# Patient Record
Sex: Female | Born: 1975 | Race: White | Hispanic: No | Marital: Married | Smoking: Former smoker
Health system: Southern US, Community
[De-identification: ages and names within clinical notes are randomized; demographics above are authoritative.]

## PROBLEM LIST (undated history)

## (undated) ENCOUNTER — Inpatient Hospital Stay (HOSPITAL_COMMUNITY): Payer: Self-pay

## (undated) DIAGNOSIS — E538 Deficiency of other specified B group vitamins: Secondary | ICD-10-CM

## (undated) DIAGNOSIS — Q899 Congenital malformation, unspecified: Secondary | ICD-10-CM

## (undated) DIAGNOSIS — K5900 Constipation, unspecified: Secondary | ICD-10-CM

## (undated) DIAGNOSIS — J45909 Unspecified asthma, uncomplicated: Secondary | ICD-10-CM

## (undated) DIAGNOSIS — N200 Calculus of kidney: Secondary | ICD-10-CM

## (undated) DIAGNOSIS — M1990 Unspecified osteoarthritis, unspecified site: Secondary | ICD-10-CM

## (undated) DIAGNOSIS — R011 Cardiac murmur, unspecified: Secondary | ICD-10-CM

## (undated) DIAGNOSIS — O039 Complete or unspecified spontaneous abortion without complication: Secondary | ICD-10-CM

## (undated) DIAGNOSIS — F909 Attention-deficit hyperactivity disorder, unspecified type: Secondary | ICD-10-CM

## (undated) DIAGNOSIS — Q999 Chromosomal abnormality, unspecified: Secondary | ICD-10-CM

## (undated) DIAGNOSIS — G589 Mononeuropathy, unspecified: Secondary | ICD-10-CM

## (undated) DIAGNOSIS — D689 Coagulation defect, unspecified: Secondary | ICD-10-CM

## (undated) DIAGNOSIS — A609 Anogenital herpesviral infection, unspecified: Secondary | ICD-10-CM

## (undated) DIAGNOSIS — E7211 Homocystinuria: Secondary | ICD-10-CM

## (undated) DIAGNOSIS — R519 Headache, unspecified: Secondary | ICD-10-CM

## (undated) DIAGNOSIS — S83519A Sprain of anterior cruciate ligament of unspecified knee, initial encounter: Secondary | ICD-10-CM

## (undated) DIAGNOSIS — D649 Anemia, unspecified: Secondary | ICD-10-CM

## (undated) DIAGNOSIS — E739 Lactose intolerance, unspecified: Secondary | ICD-10-CM

## (undated) DIAGNOSIS — O99345 Other mental disorders complicating the puerperium: Secondary | ICD-10-CM

## (undated) DIAGNOSIS — E7212 Methylenetetrahydrofolate reductase deficiency: Secondary | ICD-10-CM

## (undated) DIAGNOSIS — M2550 Pain in unspecified joint: Secondary | ICD-10-CM

## (undated) DIAGNOSIS — F53 Postpartum depression: Secondary | ICD-10-CM

## (undated) DIAGNOSIS — I8290 Acute embolism and thrombosis of unspecified vein: Secondary | ICD-10-CM

## (undated) DIAGNOSIS — I10 Essential (primary) hypertension: Secondary | ICD-10-CM

## (undated) DIAGNOSIS — M722 Plantar fascial fibromatosis: Secondary | ICD-10-CM

## (undated) DIAGNOSIS — R51 Headache: Secondary | ICD-10-CM

## (undated) HISTORY — DX: Headache, unspecified: R51.9

## (undated) HISTORY — DX: Postpartum depression: F53.0

## (undated) HISTORY — DX: Mononeuropathy, unspecified: G58.9

## (undated) HISTORY — DX: Attention-deficit hyperactivity disorder, unspecified type: F90.9

## (undated) HISTORY — DX: Other mental disorders complicating the puerperium: O99.345

## (undated) HISTORY — DX: Essential (primary) hypertension: I10

## (undated) HISTORY — DX: Unspecified asthma, uncomplicated: J45.909

## (undated) HISTORY — DX: Congenital malformation, unspecified: Q89.9

## (undated) HISTORY — DX: Chromosomal abnormality, unspecified: Q99.9

## (undated) HISTORY — DX: Coagulation defect, unspecified: D68.9

## (undated) HISTORY — DX: Deficiency of other specified B group vitamins: E53.8

## (undated) HISTORY — DX: Calculus of kidney: N20.0

## (undated) HISTORY — DX: Unspecified osteoarthritis, unspecified site: M19.90

## (undated) HISTORY — DX: Homocystinuria: E72.12

## (undated) HISTORY — DX: Constipation, unspecified: K59.00

## (undated) HISTORY — DX: Headache: R51

## (undated) HISTORY — PX: TONSILLECTOMY AND ADENOIDECTOMY: SUR1326

## (undated) HISTORY — DX: Pain in unspecified joint: M25.50

## (undated) HISTORY — PX: WISDOM TOOTH EXTRACTION: SHX21

## (undated) HISTORY — DX: Cardiac murmur, unspecified: R01.1

## (undated) HISTORY — DX: Anogenital herpesviral infection, unspecified: A60.9

## (undated) HISTORY — DX: Acute embolism and thrombosis of unspecified vein: I82.90

## (undated) HISTORY — DX: Methylenetetrahydrofolate reductase deficiency: E72.12

## (undated) HISTORY — DX: Lactose intolerance, unspecified: E73.9

## (undated) HISTORY — DX: Plantar fascial fibromatosis: M72.2

## (undated) HISTORY — DX: Homocystinuria: E72.11

## (undated) HISTORY — PX: OTHER SURGICAL HISTORY: SHX169

## (undated) HISTORY — DX: Anemia, unspecified: D64.9

## (undated) HISTORY — DX: Complete or unspecified spontaneous abortion without complication: O03.9

## (undated) HISTORY — PX: KNEE SURGERY: SHX244

---
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
## 2000-06-10 ENCOUNTER — Inpatient Hospital Stay (HOSPITAL_COMMUNITY): Admission: EM | Admit: 2000-06-10 | Discharge: 2000-06-12 | Payer: Self-pay | Admitting: Psychiatry

## 2004-04-23 ENCOUNTER — Encounter: Payer: Self-pay | Admitting: Family Medicine

## 2004-11-27 ENCOUNTER — Ambulatory Visit: Payer: Self-pay | Admitting: Family Medicine

## 2004-12-18 ENCOUNTER — Ambulatory Visit: Payer: Self-pay | Admitting: Family Medicine

## 2005-01-04 ENCOUNTER — Ambulatory Visit: Payer: Self-pay | Admitting: Family Medicine

## 2005-01-25 ENCOUNTER — Ambulatory Visit: Payer: Self-pay | Admitting: Family Medicine

## 2005-02-15 ENCOUNTER — Ambulatory Visit: Payer: Self-pay | Admitting: Family Medicine

## 2005-03-18 ENCOUNTER — Ambulatory Visit: Payer: Self-pay | Admitting: Family Medicine

## 2005-06-05 ENCOUNTER — Ambulatory Visit: Payer: Self-pay | Admitting: Family Medicine

## 2005-06-07 ENCOUNTER — Ambulatory Visit: Payer: Self-pay | Admitting: Family Medicine

## 2005-06-10 ENCOUNTER — Ambulatory Visit: Payer: Self-pay | Admitting: Family Medicine

## 2005-10-29 DIAGNOSIS — F909 Attention-deficit hyperactivity disorder, unspecified type: Secondary | ICD-10-CM | POA: Insufficient documentation

## 2005-10-29 DIAGNOSIS — F902 Attention-deficit hyperactivity disorder, combined type: Secondary | ICD-10-CM | POA: Insufficient documentation

## 2005-10-29 DIAGNOSIS — R760 Raised antibody titer: Secondary | ICD-10-CM | POA: Insufficient documentation

## 2005-12-25 ENCOUNTER — Encounter: Payer: Self-pay | Admitting: Family Medicine

## 2006-02-20 ENCOUNTER — Encounter: Payer: Self-pay | Admitting: Family Medicine

## 2006-02-20 ENCOUNTER — Ambulatory Visit: Payer: Self-pay | Admitting: Family Medicine

## 2006-02-26 ENCOUNTER — Telehealth: Payer: Self-pay | Admitting: Family Medicine

## 2006-02-27 ENCOUNTER — Telehealth: Payer: Self-pay | Admitting: Family Medicine

## 2006-04-16 ENCOUNTER — Ambulatory Visit: Payer: Self-pay | Admitting: Family Medicine

## 2006-06-26 ENCOUNTER — Encounter: Admission: RE | Admit: 2006-06-26 | Discharge: 2006-06-26 | Payer: Self-pay | Admitting: Family Medicine

## 2006-06-26 ENCOUNTER — Telehealth: Payer: Self-pay | Admitting: Family Medicine

## 2006-06-27 ENCOUNTER — Ambulatory Visit: Payer: Self-pay | Admitting: Family Medicine

## 2006-09-25 ENCOUNTER — Encounter: Payer: Self-pay | Admitting: Family Medicine

## 2006-09-25 ENCOUNTER — Ambulatory Visit: Payer: Self-pay | Admitting: Family Medicine

## 2006-09-29 ENCOUNTER — Telehealth: Payer: Self-pay | Admitting: Family Medicine

## 2006-09-29 ENCOUNTER — Encounter: Payer: Self-pay | Admitting: Family Medicine

## 2006-10-03 ENCOUNTER — Ambulatory Visit: Payer: Self-pay | Admitting: Family Medicine

## 2006-10-03 DIAGNOSIS — B009 Herpesviral infection, unspecified: Secondary | ICD-10-CM | POA: Insufficient documentation

## 2006-11-28 ENCOUNTER — Encounter: Payer: Self-pay | Admitting: Family Medicine

## 2006-11-28 ENCOUNTER — Other Ambulatory Visit: Admission: RE | Admit: 2006-11-28 | Discharge: 2006-11-28 | Payer: Self-pay | Admitting: Family Medicine

## 2006-11-28 ENCOUNTER — Ambulatory Visit: Payer: Self-pay | Admitting: Family Medicine

## 2006-12-02 LAB — CONVERTED CEMR LAB: Pap Smear: NORMAL

## 2006-12-09 ENCOUNTER — Ambulatory Visit: Payer: Self-pay | Admitting: Obstetrics & Gynecology

## 2006-12-16 ENCOUNTER — Encounter: Payer: Self-pay | Admitting: Obstetrics and Gynecology

## 2006-12-16 ENCOUNTER — Ambulatory Visit: Payer: Self-pay | Admitting: Obstetrics and Gynecology

## 2007-01-30 ENCOUNTER — Ambulatory Visit: Payer: Self-pay | Admitting: Physician Assistant

## 2007-01-30 ENCOUNTER — Ambulatory Visit (HOSPITAL_COMMUNITY): Admission: RE | Admit: 2007-01-30 | Discharge: 2007-01-30 | Payer: Self-pay | Admitting: Physician Assistant

## 2007-02-20 ENCOUNTER — Ambulatory Visit: Payer: Self-pay | Admitting: Physician Assistant

## 2007-02-20 ENCOUNTER — Ambulatory Visit (HOSPITAL_COMMUNITY): Admission: RE | Admit: 2007-02-20 | Discharge: 2007-02-20 | Payer: Self-pay | Admitting: Obstetrics & Gynecology

## 2007-02-27 ENCOUNTER — Ambulatory Visit (HOSPITAL_COMMUNITY): Admission: RE | Admit: 2007-02-27 | Discharge: 2007-02-27 | Payer: Self-pay | Admitting: Obstetrics & Gynecology

## 2007-03-20 ENCOUNTER — Ambulatory Visit: Payer: Self-pay | Admitting: Obstetrics and Gynecology

## 2007-04-09 ENCOUNTER — Ambulatory Visit: Payer: Self-pay | Admitting: Family Medicine

## 2007-04-17 ENCOUNTER — Ambulatory Visit: Payer: Self-pay | Admitting: Family

## 2007-05-08 ENCOUNTER — Ambulatory Visit: Payer: Self-pay | Admitting: Obstetrics and Gynecology

## 2007-05-22 ENCOUNTER — Ambulatory Visit: Payer: Self-pay | Admitting: Obstetrics and Gynecology

## 2007-06-05 ENCOUNTER — Ambulatory Visit: Payer: Self-pay | Admitting: Physician Assistant

## 2007-06-19 ENCOUNTER — Ambulatory Visit: Payer: Self-pay | Admitting: Obstetrics and Gynecology

## 2007-07-03 ENCOUNTER — Ambulatory Visit: Payer: Self-pay | Admitting: Physician Assistant

## 2007-07-10 ENCOUNTER — Ambulatory Visit: Payer: Self-pay | Admitting: Physician Assistant

## 2007-07-17 ENCOUNTER — Ambulatory Visit: Payer: Self-pay | Admitting: Obstetrics and Gynecology

## 2007-07-29 ENCOUNTER — Ambulatory Visit: Payer: Self-pay | Admitting: Obstetrics & Gynecology

## 2007-07-29 ENCOUNTER — Ambulatory Visit (HOSPITAL_COMMUNITY): Admission: RE | Admit: 2007-07-29 | Discharge: 2007-07-29 | Payer: Self-pay | Admitting: Obstetrics & Gynecology

## 2007-08-03 ENCOUNTER — Ambulatory Visit: Payer: Self-pay | Admitting: Obstetrics & Gynecology

## 2007-08-04 ENCOUNTER — Ambulatory Visit: Payer: Self-pay | Admitting: Family

## 2007-08-06 ENCOUNTER — Ambulatory Visit: Payer: Self-pay | Admitting: Advanced Practice Midwife

## 2007-08-06 ENCOUNTER — Inpatient Hospital Stay (HOSPITAL_COMMUNITY): Admission: RE | Admit: 2007-08-06 | Discharge: 2007-08-07 | Payer: Self-pay | Admitting: Family Medicine

## 2007-08-10 ENCOUNTER — Ambulatory Visit: Payer: Self-pay | Admitting: Family Medicine

## 2007-08-10 LAB — CONVERTED CEMR LAB
Bilirubin Urine: NEGATIVE
Hemoglobin: 11.2 g/dL
Ketones, urine, test strip: NEGATIVE
Specific Gravity, Urine: 1.015

## 2007-08-11 ENCOUNTER — Encounter: Payer: Self-pay | Admitting: Family Medicine

## 2007-08-14 ENCOUNTER — Ambulatory Visit: Payer: Self-pay | Admitting: Obstetrics and Gynecology

## 2007-09-18 ENCOUNTER — Ambulatory Visit: Payer: Self-pay | Admitting: Physician Assistant

## 2007-10-09 ENCOUNTER — Telehealth: Payer: Self-pay | Admitting: Family Medicine

## 2007-10-19 ENCOUNTER — Ambulatory Visit: Payer: Self-pay | Admitting: Family

## 2007-12-14 ENCOUNTER — Encounter: Payer: Self-pay | Admitting: Obstetrics and Gynecology

## 2007-12-14 ENCOUNTER — Ambulatory Visit: Payer: Self-pay | Admitting: Obstetrics and Gynecology

## 2007-12-15 ENCOUNTER — Encounter: Payer: Self-pay | Admitting: Obstetrics and Gynecology

## 2007-12-15 LAB — CONVERTED CEMR LAB: TSH: 1.015 u[IU]/mL

## 2008-02-01 ENCOUNTER — Ambulatory Visit: Payer: Self-pay | Admitting: Obstetrics and Gynecology

## 2008-02-02 ENCOUNTER — Encounter: Payer: Self-pay | Admitting: Obstetrics and Gynecology

## 2008-02-05 ENCOUNTER — Ambulatory Visit: Payer: Self-pay | Admitting: Family

## 2008-02-06 ENCOUNTER — Encounter: Payer: Self-pay | Admitting: Family

## 2008-02-06 LAB — CONVERTED CEMR LAB
Clue Cells Wet Prep HPF POC: NONE SEEN
WBC, Wet Prep HPF POC: NONE SEEN

## 2008-04-15 ENCOUNTER — Ambulatory Visit: Payer: Self-pay | Admitting: Obstetrics and Gynecology

## 2008-04-15 LAB — CONVERTED CEMR LAB
Basophils Absolute: 0 10*3/uL (ref 0.0–0.1)
Basophils Relative: 0 % (ref 0–1)
Hemoglobin: 12.1 g/dL (ref 12.0–15.0)
Hepatitis B Surface Ag: NEGATIVE
MCHC: 31.6 g/dL (ref 30.0–36.0)
MCV: 84.5 fL (ref 78.0–100.0)
Monocytes Absolute: 0.7 10*3/uL (ref 0.1–1.0)
Monocytes Relative: 8 % (ref 3–12)
Neutro Abs: 5.3 10*3/uL (ref 1.7–7.7)
Platelets: 379 10*3/uL (ref 150–400)
RDW: 13.9 % (ref 11.5–15.5)
WBC: 8.5 10*3/uL (ref 4.0–10.5)

## 2008-04-16 ENCOUNTER — Encounter: Payer: Self-pay | Admitting: Obstetrics and Gynecology

## 2008-05-08 ENCOUNTER — Inpatient Hospital Stay (HOSPITAL_COMMUNITY): Admission: AD | Admit: 2008-05-08 | Discharge: 2008-05-08 | Payer: Self-pay | Admitting: Obstetrics & Gynecology

## 2008-05-08 ENCOUNTER — Ambulatory Visit: Payer: Self-pay | Admitting: Advanced Practice Midwife

## 2008-05-12 ENCOUNTER — Ambulatory Visit: Payer: Self-pay | Admitting: Family Medicine

## 2008-05-12 ENCOUNTER — Encounter: Payer: Self-pay | Admitting: Family Medicine

## 2008-05-12 ENCOUNTER — Inpatient Hospital Stay (HOSPITAL_COMMUNITY): Admission: AD | Admit: 2008-05-12 | Discharge: 2008-05-12 | Payer: Self-pay | Admitting: Obstetrics and Gynecology

## 2008-05-27 ENCOUNTER — Ambulatory Visit: Payer: Self-pay | Admitting: Obstetrics and Gynecology

## 2008-07-26 ENCOUNTER — Encounter: Payer: Self-pay | Admitting: Obstetrics and Gynecology

## 2008-07-28 ENCOUNTER — Encounter: Payer: Self-pay | Admitting: Obstetrics and Gynecology

## 2008-07-28 LAB — CONVERTED CEMR LAB: Progesterone: 0.7 ng/mL

## 2008-09-25 ENCOUNTER — Inpatient Hospital Stay (HOSPITAL_COMMUNITY): Admission: AD | Admit: 2008-09-25 | Discharge: 2008-09-25 | Payer: Self-pay | Admitting: Obstetrics & Gynecology

## 2008-09-27 ENCOUNTER — Encounter: Payer: Self-pay | Admitting: Obstetrics and Gynecology

## 2008-09-27 LAB — CONVERTED CEMR LAB: hCG, Beta Chain, Quant, S: 484.3 milliintl units/mL

## 2008-10-07 ENCOUNTER — Ambulatory Visit: Payer: Self-pay | Admitting: Obstetrics & Gynecology

## 2008-10-07 ENCOUNTER — Ambulatory Visit: Payer: Self-pay | Admitting: Obstetrics and Gynecology

## 2008-10-14 ENCOUNTER — Ambulatory Visit: Payer: Self-pay | Admitting: Physician Assistant

## 2008-10-26 ENCOUNTER — Ambulatory Visit: Payer: Self-pay | Admitting: Family Medicine

## 2008-10-26 LAB — CONVERTED CEMR LAB: Rapid Strep: NEGATIVE

## 2008-10-27 ENCOUNTER — Telehealth: Payer: Self-pay | Admitting: Family Medicine

## 2008-11-04 ENCOUNTER — Ambulatory Visit: Payer: Self-pay | Admitting: Physician Assistant

## 2008-12-09 ENCOUNTER — Ambulatory Visit: Payer: Self-pay | Admitting: Obstetrics and Gynecology

## 2009-01-06 ENCOUNTER — Ambulatory Visit: Payer: Self-pay | Admitting: Obstetrics and Gynecology

## 2009-02-03 ENCOUNTER — Ambulatory Visit (HOSPITAL_COMMUNITY): Admission: RE | Admit: 2009-02-03 | Discharge: 2009-02-03 | Payer: Self-pay | Admitting: Obstetrics & Gynecology

## 2009-02-03 ENCOUNTER — Ambulatory Visit: Payer: Self-pay | Admitting: Obstetrics and Gynecology

## 2009-02-03 LAB — CONVERTED CEMR LAB
Basophils Relative: 0 % (ref 0–1)
HCT: 36.4 % (ref 36.0–46.0)
Hemoglobin: 11.6 g/dL — ABNORMAL LOW (ref 12.0–15.0)
Hepatitis B Surface Ag: NEGATIVE
Lymphocytes Relative: 21 % (ref 12–46)
MCHC: 31.9 g/dL (ref 30.0–36.0)
MCV: 86.9 fL (ref 78.0–100.0)
Monocytes Absolute: 0.6 10*3/uL (ref 0.1–1.0)
Monocytes Relative: 6 % (ref 3–12)
Platelets: 321 10*3/uL (ref 150–400)
RBC: 4.19 M/uL (ref 3.87–5.11)
Rubella: 123.1 intl units/mL — ABNORMAL HIGH
WBC: 10.4 10*3/uL (ref 4.0–10.5)

## 2009-02-04 ENCOUNTER — Encounter: Payer: Self-pay | Admitting: Obstetrics and Gynecology

## 2009-02-14 ENCOUNTER — Ambulatory Visit: Payer: Self-pay | Admitting: Obstetrics & Gynecology

## 2009-03-03 ENCOUNTER — Ambulatory Visit: Payer: Self-pay | Admitting: Physician Assistant

## 2009-03-03 LAB — CONVERTED CEMR LAB
Chlamydia, Swab/Urine, PCR: NEGATIVE
GC Probe Amp, Urine: NEGATIVE

## 2009-03-17 ENCOUNTER — Ambulatory Visit: Payer: Self-pay | Admitting: Physician Assistant

## 2009-03-17 LAB — CONVERTED CEMR LAB
Hemoglobin: 11.7 g/dL — ABNORMAL LOW (ref 12.0–15.0)
MCHC: 31.9 g/dL (ref 30.0–36.0)
RBC: 4.23 M/uL (ref 3.87–5.11)
RDW: 14.3 % (ref 11.5–15.5)

## 2009-03-18 ENCOUNTER — Encounter: Payer: Self-pay | Admitting: Obstetrics and Gynecology

## 2009-03-21 ENCOUNTER — Ambulatory Visit: Payer: Self-pay | Admitting: Obstetrics & Gynecology

## 2009-03-21 LAB — CONVERTED CEMR LAB
Alkaline Phosphatase: 65 units/L (ref 39–117)
BUN: 7 mg/dL (ref 6–23)
Calcium: 9.6 mg/dL (ref 8.4–10.5)
Creatinine, Ser: 0.6 mg/dL (ref 0.40–1.20)
HCT: 35.7 % — ABNORMAL LOW (ref 36.0–46.0)
Hemoglobin: 12.1 g/dL (ref 12.0–15.0)
MCHC: 33.9 g/dL (ref 30.0–36.0)
MCV: 84.8 fL (ref 78.0–100.0)
Potassium: 3.5 meq/L (ref 3.5–5.3)
RBC: 4.21 M/uL (ref 3.87–5.11)
Sodium: 135 meq/L (ref 135–145)
Total Bilirubin: 0.5 mg/dL (ref 0.3–1.2)

## 2009-03-31 ENCOUNTER — Ambulatory Visit: Payer: Self-pay | Admitting: Obstetrics and Gynecology

## 2009-03-31 ENCOUNTER — Encounter: Payer: Self-pay | Admitting: Physician Assistant

## 2009-04-14 ENCOUNTER — Ambulatory Visit: Payer: Self-pay | Admitting: Family Medicine

## 2009-04-28 ENCOUNTER — Ambulatory Visit: Payer: Self-pay | Admitting: Physician Assistant

## 2009-04-29 ENCOUNTER — Ambulatory Visit: Payer: Self-pay | Admitting: Advanced Practice Midwife

## 2009-04-29 ENCOUNTER — Inpatient Hospital Stay (HOSPITAL_COMMUNITY): Admission: AD | Admit: 2009-04-29 | Discharge: 2009-04-29 | Payer: Self-pay | Admitting: Obstetrics & Gynecology

## 2009-05-15 ENCOUNTER — Ambulatory Visit: Payer: Self-pay | Admitting: Family

## 2009-05-16 ENCOUNTER — Encounter: Payer: Self-pay | Admitting: Family

## 2009-05-16 LAB — CONVERTED CEMR LAB
Chlamydia, DNA Probe: NEGATIVE
GC Probe Amp, Genital: NEGATIVE

## 2009-05-26 ENCOUNTER — Ambulatory Visit: Payer: Self-pay | Admitting: Obstetrics and Gynecology

## 2009-06-04 ENCOUNTER — Ambulatory Visit: Payer: Self-pay | Admitting: Obstetrics and Gynecology

## 2009-06-04 ENCOUNTER — Inpatient Hospital Stay (HOSPITAL_COMMUNITY): Admission: AD | Admit: 2009-06-04 | Discharge: 2009-06-05 | Payer: Self-pay | Admitting: Obstetrics and Gynecology

## 2009-06-09 ENCOUNTER — Encounter: Payer: Self-pay | Admitting: Cardiology

## 2009-06-14 ENCOUNTER — Ambulatory Visit: Payer: Self-pay | Admitting: Cardiology

## 2009-06-14 ENCOUNTER — Encounter: Payer: Self-pay | Admitting: Cardiology

## 2009-06-14 ENCOUNTER — Ambulatory Visit: Payer: Self-pay | Admitting: Obstetrics & Gynecology

## 2009-06-16 ENCOUNTER — Ambulatory Visit (HOSPITAL_COMMUNITY): Admission: RE | Admit: 2009-06-16 | Discharge: 2009-06-16 | Payer: Self-pay | Admitting: Cardiology

## 2009-06-16 ENCOUNTER — Ambulatory Visit: Payer: Self-pay

## 2009-06-16 ENCOUNTER — Encounter: Payer: Self-pay | Admitting: Cardiology

## 2009-06-16 ENCOUNTER — Ambulatory Visit: Payer: Self-pay | Admitting: Cardiovascular Disease

## 2009-06-21 ENCOUNTER — Telehealth: Payer: Self-pay | Admitting: Cardiology

## 2009-07-21 ENCOUNTER — Ambulatory Visit: Payer: Self-pay | Admitting: Family

## 2009-08-02 ENCOUNTER — Encounter: Payer: Self-pay | Admitting: Cardiology

## 2009-08-02 ENCOUNTER — Ambulatory Visit: Payer: Self-pay | Admitting: Cardiology

## 2010-01-02 ENCOUNTER — Ambulatory Visit: Payer: Self-pay | Admitting: Emergency Medicine

## 2010-01-03 ENCOUNTER — Ambulatory Visit: Payer: Self-pay | Admitting: Obstetrics & Gynecology

## 2010-01-19 ENCOUNTER — Ambulatory Visit: Payer: Self-pay | Admitting: Obstetrics and Gynecology

## 2010-01-19 ENCOUNTER — Encounter: Payer: Self-pay | Admitting: Obstetrics and Gynecology

## 2010-01-19 ENCOUNTER — Ambulatory Visit: Payer: Self-pay | Admitting: Family Medicine

## 2010-01-19 LAB — CONVERTED CEMR LAB: TSH: 0.922 microintl units/mL (ref 0.350–4.500)

## 2010-02-02 ENCOUNTER — Ambulatory Visit (HOSPITAL_COMMUNITY)
Admission: RE | Admit: 2010-02-02 | Discharge: 2010-02-02 | Payer: Self-pay | Source: Home / Self Care | Attending: Obstetrics & Gynecology | Admitting: Obstetrics & Gynecology

## 2010-02-02 ENCOUNTER — Ambulatory Visit: Admit: 2010-02-02 | Payer: Self-pay | Admitting: Family

## 2010-02-12 ENCOUNTER — Encounter: Payer: Self-pay | Admitting: Obstetrics & Gynecology

## 2010-02-20 NOTE — Assessment & Plan Note (Signed)
Summary: [facility]   Visit Type:  6 weeks follow up Primary Provider:  Nani Gasser MD  CC:  No complains.  History of Present Illness: 35 year old female I recently saw in May of 2011 for evaluation of dyspnea and chest pain. Note she was recently postpartum at that time. She was seen in the [facility] emergency room 06/09/09 for chest pain. An electrocardiogram shows no ST changes. A chest x-ray showed mild cardiomegaly but no acute disease. A CAT scan showed no pulmonary embolus and was otherwise unremarkable. Renal function was normal. Liver function were normal. White blood cell count was 9.1 with a hemoglobin of 11.8 and hematocrit 34.5. Patient was discharged. We scheduled an echocardiogram which was performed in May of 2011. This showed normal LV function and no significant valvular abnormalities. Since then her dyspnea has completely resolved. She denies any dyspnea on exertion, orthopnea, PND, pedal edema, palpitations, syncope. She has had no further chest pain.  \ Current Medications (verified): 1)  Multivitamins  Tabs (Multiple Vitamin) .... Take 1 Tablet By Mouth Once A Day  Allergies (verified): No Known Drug Allergies  Past History:  Past Medical History: Reviewed history from 06/14/2009 and no changes required. 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 H/O heart problem as an infant  Past Surgical History: Reviewed history from 06/14/2009 and no changes required. D & C 1998 Surgery for arm fracture Arthroscopic knee surgery  Social History: Reviewed history from 06/14/2009 and no changes required. 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  Married.  Quit smoking -Smoked 1ppd for 10 yrs.  No EtOH, no drugs currently, 2 caffeinated drinks per day.  Walks 2-3x/wk.  Review of Systems       no fevers or chills, productive cough, hemoptysis, dysphasia, odynophagia, melena, hematochezia, dysuria, hematuria, rash, seizure activity,  orthopnea, PND, pedal edema, claudication. Remaining systems are negative.   Vital Signs:  Patient profile:   35 year old female Height:      66.5 inches Weight:      241.75 pounds BMI:     38.57 Pulse rate:   83 / minute Pulse rhythm:   regular Resp:     18 per minute BP sitting:   123 / 77  (right arm) Cuff size:   large  Vitals Entered By: Vikki Ports (August 02, 2009 12:30 PM)  Physical Exam  General:  Well-developed well-nourished in no acute distress.  Skin is warm and dry.  HEENT is normal.  Neck is supple. No thyromegaly.  Chest is clear to auscultation with normal expansion.  Cardiovascular exam is regular rate and rhythm.  Abdominal exam nontender or distended. No masses palpated. Extremities show no edema. neuro grossly intact    Impression & Recommendations:  Problem # 1:  CHEST PAIN (ICD-786.50) Symptoms have resolved. LV function normal with normal wall motion. No further workup at this time.  Problem # 2:  DYSPNEA (ICD-786.05) Patient's dyspnea has resolved. LV function normal. Previous CT showed no pulmonary embolus. No further workup.

## 2010-02-20 NOTE — Assessment & Plan Note (Signed)
Summary: [facility]   Visit Type:  Initial Consult Primary Provider:  Nani Gasser MD  CC:  chest pain and Sob.  History of Present Illness: 35 year old female status post delivery 10 days ago for evaluation of dyspnea and chest pain. She was seen in the [facility] emergency room 06/09/09 for chest pain. An electrocardiogram shows no ST changes. A chest x-ray showed mild cardiomegaly but no acute disease. A CAT scan showed no pulmonary embolus and was otherwise unremarkable. Renal function was normal. Liver function were normal. White blood cell count was 9.1 with a hemoglobin of 11.8 and hematocrit 34.5. Patient was discharged. She was seen in the OB/GYN office today and we were asked to further evaluate. The patient states that 3 days following delivery she developed a diffuse pain in her chest, back. It was described as an "ache.". It did not radiate. There was no associated nausea, vomiting, diaphoresis or shortness of breath. The pain was not pleuritic or positional nor was it related to food. It was not exertional. It has been continuous for one week. She has also noticed a sensation of dyspnea with exertion and "unable get a good breath". There is no orthopnea, PND or syncope. She does state she has had mild pedal edema intermittently.  Current Medications (verified): 1)  Prenatal Vitamins 0.8 Mg Tabs (Prenatal Multivit-Min-Fe-Fa) .... 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 H/O heart problem as an infant  Past Surgical History: D & C 1998 Surgery for arm fracture Arthroscopic knee surgery  Family History: Reviewed history from 06/14/2009 and no changes required. Daughter with autism and pancreatic  Adopted  Social History: Reviewed history from 06/14/2009 and no changes required. 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   Married.  Quit smoking -Smoked 1ppd for 10 yrs.  No EtOH, no drugs currently, 2 caffeinated drinks per day.  Walks 2-3x/wk.  Review of Systems       no fevers or chills, productive cough, hemoptysis, dysphasia, odynophagia, melena, hematochezia, dysuria, hematuria, rash, seizure activity, orthopnea, PND,  claudication. Remaining systems are negative.   Vital Signs:  Patient profile:   35 year old female Height:      66.5 inches Weight:      247 pounds BMI:     39.41 Pulse rate:   93 / minute Pulse rhythm:   regular Resp:     18 per minute BP sitting:   123 / 84  Vitals Entered By: Vikki Ports (Jun 14, 2009 1:03 PM)  Physical Exam  General:  Well developed/obese in NAD Skin warm/dry Patient not depressed No peripheral clubbing Back-normal HEENT-normal/normal eyelids Neck supple/normal carotid upstroke bilaterally; no bruits; no JVD; no thyromegaly chest - CTA/ normal expansion CV - RRR/normal S1 and S2; no  rubs or gallops; PMI nondisplaced; 2/6 systolic ejection murmur left sternal border.  Abdomen -NT/ND, no HSM, no mass, + bowel sounds, no bruit 2+ femoral pulses, no bruits Ext-no edema, chords, 2+ DP Neuro-grossly nonfocal     EKG  Procedure date:  06/14/2009  Findings:      Sinus rhythm at a rate of 93. Axis normal. No ST changes.  Impression & Recommendations:  Problem # 1:  DYSPNEA (ICD-786.05) Etiology unclear. Chest CT shows no pulmonary embolus by report. Electrocardiogram normal. She does not appear to be volume overloaded on examination. However I will schedule an echocardiogram to make sure  she does not have peripartum cardiomyopathy. Note her saturation on room air was 97% and continued to be 97% with ambulation in the office. Orders: Echocardiogram (Echo)  Problem # 2:  CHEST PAIN (ICD-786.50) Etiology also unclear. No pulmonary embolus on CT. Electrocardiogram normal and pain does not sound cardiac as it has been continuous for one week.  No change with movement. Question musculoskeletal. Await echocardiogram.  Problem # 3:  ATTENTION DEFICIT, W/HYPERACTIVITY (ICD-314.01)  Patient Instructions: 1)  Your physician recommends that you schedule a follow-up appointment in: 6 WEEKS 2)  Your physician has requested that you have an echocardiogram.  Echocardiography is a painless test that uses sound waves to create images of your heart. It provides your doctor with information about the size and shape of your heart and how well your heart's chambers and valves are working.  This procedure takes approximately one hour. There are no restrictions for this procedure.

## 2010-02-20 NOTE — Progress Notes (Signed)
Summary: returning call  Phone Note Call from Patient Call back at Home Phone [phone]   Caller: Patient Reason for Call: Talk to Nurse Summary of Call: returning call Initial call taken by: Migdalia Dk,  June 21, 2009 11:54 AM  Follow-up for Phone Call        Phone Call Completed Follow-up by: Scherrie Bateman, LPN,  June 22, 979 12:04 PM

## 2010-02-20 NOTE — Assessment & Plan Note (Signed)
Summary: DISCUSS BIRTH PLAN   Vital Signs:  Patient profile:   35 year old female Height:      66.5 inches Weight:      254 pounds BMI:     40.53 O2 Sat:      97 % on Room air Pulse rate:   119 / minute BP sitting:   139 / 78  (left arm)  Vitals Entered By: Payton Spark CMA (April 14, 2009 10:08 AM)  O2 Flow:  Room air CC: Discuss birth plan   Primary Care Provider:  Nani Gasser MD  CC:  Discuss birth plan.  History of Present Illness: 35 yo WF presents for discussion about birth plan.  She is seeing Hollace Kinnier, Nurse midwife.  Her EDC is 06-08-2009.  [id number].  She has had all vaginal deliveries.  She had one PTD (her 2nd child).  She is doing well.  Due to religious reasons, she requests: 1) a short hosp stay, barring a healthy mom and baby. 2) Newborn screening labs (or just PKU) to be drawn on day 3 of life -- can go back to the nursery for this. 3) no newborn bath -- this she will have to talk to [facility] about and is subject to change if meconium is present.  I explained that we would do our best to accomodate her wishes but the baby's health is going to be our priority.  I sent mom home with my business card.  Her doula will call us when she goes into labor.      Current Medications (verified): 1)  Acyclovir 400 Mg  Tabs (Acyclovir) .Marland Kitchen.. 1 Tab By Mouth Three Times A Day X 5 Days As Needed For Herpes Rash  Allergies (verified): No Known Drug Allergies   Impression & Recommendations:  Problem # 1:  PREGNANT STATE, INCIDENTAL (ICD-V22.2) Newborn plan of care discussed with mom. Time spent face to face: 15 min.  Complete Medication List: 1)  Acyclovir 400 Mg Tabs (Acyclovir) .Marland Kitchen.. 1 tab by mouth three times a day x 5 days as needed for herpes rash

## 2010-02-22 NOTE — Assessment & Plan Note (Signed)
Summary: Possible UTI/NH   Vital Signs:  Patient Profile:   35 Years Old Female CC:      possible UTI O2 treatment:    Room Air (left arm) Cuff size:   large  Vitals Entered By: Clemens Catholic LPN (January 02, 2010 3:32 PM)                  Updated Prior Medication List: MULTIVITAMINS  TABS (MULTIPLE VITAMIN) Take 1 tablet by mouth once a day  Current Allergies (reviewed today): No known allergies History of Present Illness Chief Complaint: possible UTI History of Present Illness: pt came to the clinic for pelvic pain after her OBGYN referred her to here. U/A was clear other than a trace of blood. reccomended that she increase fluids and see her OBGYN for her sch'ed appt tomorrow. if worsens today she should go to [facility] hospital- per Dr Henderson./C.Locklear,LPN  Agree with nurse's note.  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  No blood, N/V/F, HA, dizzy, SOB, vag d/c, or other symptoms other than lower abd pain.  No dysuria, freq, urg.  Pt with appt tomorrow at OB/GYN.  Pt triaged by nurse.  Urine is clean.  Tylenol as needed, hydration.  Then f/u as scheduled.  If severe symptoms or any worsening, [facility]  Physical exam deferred.  Patient's copay is refunded.    REVIEW OF SYSTEMS Constitutional Symptoms      Denies fever, chills, night sweats, weight loss, weight gain, and fatigue.  Eyes       Denies change in vision, eye pain, eye discharge, glasses, contact lenses, and eye surgery. Ear/Nose/Throat/Mouth       Denies hearing loss/aids, change in hearing, ear pain, ear discharge, dizziness, frequent runny nose, frequent nose bleeds, sinus problems, sore throat, hoarseness, and tooth pain or bleeding.  Respiratory       Denies dry cough, productive cough, wheezing, shortness of breath, asthma, bronchitis, and emphysema/COPD.  Cardiovascular       Denies murmurs, chest pain, and tires  easily with exhertion.    Gastrointestinal       Denies stomach pain, nausea/vomiting, diarrhea, constipation, blood in bowel movements, and indigestion. Genitourniary       Denies painful urination, kidney stones, and loss of urinary control. Neurological       Denies paralysis, seizures, and fainting/blackouts. Musculoskeletal       Denies muscle pain, joint pain, joint stiffness, decreased range of motion, redness, swelling, muscle weakness, and gout.  Skin       Denies bruising, unusual mles/lumps or sores, and hair/skin or nail changes.  Psych       Denies mood changes, temper/anger issues, anxiety/stress, speech problems, depression, and sleep problems. Other Comments: pt c/o    Past History:  Past Medical History: Reviewed history from 06/14/2009 and no changes required. 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 H/O heart problem as an infant  Past Surgical History: Reviewed history from 06/14/2009 and no changes required. D & C 1998 Surgery for arm fracture Arthroscopic knee surgery  Family History: Reviewed history from 06/14/2009 and no changes required. Daughter with autism and pancreatic  Adopted  Social History: Reviewed history from 06/14/2009 and no changes required. 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  Married.  Quit smoking -Smoked 1ppd for 10 yrs.  No EtOH, no drugs currently, 2 caffeinated drinks per day.  Walks 2-3x/wk.  The patient and/or caregiver has been  counseled thoroughly with regard to medications prescribed including dosage, schedule, interactions, rationale for use, and possible side effects and they verbalize understanding.  Diagnoses and expected course of recovery discussed and will return if not improved as expected or if the condition worsens. Patient and/or caregiver verbalized understanding.   Appended Document: Possible UTI/NH UA Glu: Neg Bil: Neg Ket: Neg SG: <= 1.005 Blood: Trace-intact pH: 5.5 Pro:  Neg Uro: 0.2 Nit: Neg Leu: Neg  Appended Document: Possible UTI/NH No v/s done. Physical exam was deferred and copay refunded. Patient to see OBGYN

## 2010-02-22 NOTE — Assessment & Plan Note (Signed)
Summary: CPE   Vital Signs:  Patient profile:   35 year old female Height:      66.5 inches Weight:      245 pounds Pulse rate:   66 / minute BP sitting:   132 / 76  (right arm) Cuff size:   large  Vitals Entered By: Avon Gully CMA, Duncan Dull) (January 19, 2010 10:26 AM) CC: CPE   Primary Care Provider:  Nani Gasser MD  CC:  CPE.  History of Present Illness: REcenlty gained weight with her move. Says ate out alot the first month until moved into their new home and during that time gained almost 20 lbs. She says she really struggles with portion control.  She overall eats more healthy.  She Is approx [redacted] weeks pregnant and find out this morning the baby has no heat beat. She is tearful. They will be getting genetic testing.   Current Medications (verified): 1)  Multivitamins  Tabs (Multiple Vitamin) .... Take 1 Tablet By Mouth Once A Day  Allergies (verified): No Known Drug Allergies  Comments:  Nurse/Medical Assistant: The patient's medications and allergies were reviewed with the patient and were updated in the Medication and Allergy Lists. 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 H/O heart problem as an infant  Past Surgical History: Last updated: 06/14/2009 D & C 1998 Surgery for arm fracture Arthroscopic knee surgery  Family History: Last updated: 06/14/2009 Daughter with autism and pancreatic  Adopted  Social History: Last updated: 01/19/2010 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  Married.4 children. Has had 6 miscarriages.   Quit smoking -Smoked 1ppd for 10 yrs.  No EtOH, no drugs currently, 2 caffeinated drinks per day.  Walks 2-3x/wk.  Social History: 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  Married.4 children. Has had 6 miscarriages.   Quit smoking -Smoked 1ppd for 10 yrs.  No EtOH, no drugs currently, 2  caffeinated drinks per day.  Walks 2-3x/wk.  Review of Systems  The patient denies anorexia, fever, weight loss, weight gain, vision loss, decreased hearing, hoarseness, chest pain, syncope, dyspnea on exertion, peripheral edema, prolonged cough, headaches, hemoptysis, abdominal pain, melena, hematochezia, severe indigestion/heartburn, hematuria, incontinence, genital sores, muscle weakness, suspicious skin lesions, transient blindness, difficulty walking, depression, unusual weight change, abnormal bleeding, enlarged lymph nodes, and breast masses.    Physical Exam  General:  Well-developed,well-nourished,in no acute distress; alert,appropriate and cooperative throughout examination Head:  Normocephalic and atraumatic without obvious abnormalities. No apparent alopecia or balding. Eyes:  No corneal or conjunctival inflammation noted. EOMI. Perrla.  Ears:  External ear exam shows no significant lesions or deformities.  Otoscopic examination reveals clear canals, tympanic membranes are intact bilaterally without bulging, retraction, inflammation or discharge. Hearing is grossly normal bilaterally. Nose:  External nasal examination shows no deformity or inflammation. Nasal mucosa are pink and moist without lesions or exudates. Mouth:  Oral mucosa and oropharynx without lesions or exudates.  Teeth in good repair. Neck:  No deformities, masses, or tenderness noted. Chest Wall:  No deformities, masses, or tenderness noted. Breasts:  No mass, nodules, thickening, tenderness, bulging, retraction, inflamation, nipple discharge or skin changes noted.   Lungs:  Normal respiratory effort, chest expands symmetrically. Lungs are clear to auscultation, no crackles or wheezes. Heart:  Normal rate and regular rhythm. S1 and S2 normal without gallop, murmur, click, rub or other extra sounds. Abdomen:  Bowel sounds positive,abdomen soft  and non-tender without masses, organomegaly or hernias noted. Msk:  No  deformity or scoliosis noted of thoracic or lumbar spine.   Pulses:  R and L carotid,radial,dorsalis pedis and posterior tibial pulses are full and equal bilaterally Extremities:  No clubbing, cyanosis, edema, or deformity noted with normal full range of motion of all joints.   Neurologic:  No cranial nerve deficits noted. Station and gait are normal.DTRs are symmetrical throughout. Sensory, motor and coordinative functions appear intact. Skin:  no rashes.   Cervical Nodes:  No lymphadenopathy noted Psych:  Cognition and judgment appear intact. Alert and cooperative with normal attention span and concentration. No apparent delusions, illusions, hallucinations   Impression & Recommendations:  Problem # 1:  HEALTH MAINTENANCE EXAM (ICD-V70.0) Exam is normal today. Due for screening labs.   She is not fasting today We discussed strategies to help with weight loos and with portion control.   Orders: T-Comprehensive Metabolic Panel [id number]) T-Lipid Profile [id number])  Complete Medication List: 1)  Multivitamins Tabs (Multiple vitamin) .... Take 1 tablet by mouth once a day   Orders Added: 1)  T-Comprehensive Metabolic Panel [[id number]] 2)  T-Lipid Profile [[id number]] 3)  Est. Patient age 59-39 [phone]

## 2010-03-08 ENCOUNTER — Encounter: Payer: Self-pay | Admitting: Family Medicine

## 2010-03-08 ENCOUNTER — Ambulatory Visit (INDEPENDENT_AMBULATORY_CARE_PROVIDER_SITE_OTHER): Payer: Managed Care, Other (non HMO) | Admitting: Family Medicine

## 2010-03-08 DIAGNOSIS — J069 Acute upper respiratory infection, unspecified: Secondary | ICD-10-CM | POA: Insufficient documentation

## 2010-03-14 NOTE — Assessment & Plan Note (Signed)
Summary: cough, headache//sp   Vital Signs:  Patient profile:   35 year old female Height:      66.5 inches Weight:      249.25 pounds BMI:     39.77 Temp:     98.2 degrees F oral  Vitals Entered By: Francee Piccolo CMA Duncan Dull) (March 08, 2010 1:57 PM) CC: severe headache//sp Is Patient Diabetic? No   History of Present Illness: 36 y/o WF here for URI and HA. She, her husband, and all 4 of her kids got nasal congestion, runny nose, sneezing, and cough about 10-14 days ago.  No fevers.  The URI symptoms and coughing ran their course early on, but daily headaches have persisted.  Describes entire head hurting from occiput to face.  No thick/purulent nasal d/c, no PND, no ST.  No abd pain, no wheezing or SOB.  No rash.  No n/v/d. Has soreness in muscles of back of her neck diffusely.  No neck stiffness.  Current Medications (verified): 1)  Multivitamins  Tabs (Multiple Vitamin) .... 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  Past Surgical History: Reviewed history from 06/14/2009 and no changes required. D & C 1998 Surgery for arm fracture Arthroscopic knee surgery  Family History: Reviewed history from 06/14/2009 and no changes required. Daughter with autism and pancreatic  Adopted  Social History: Reviewed history from 01/19/2010 and no changes required. Homemaker.  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   Married, 4 children.  Has had 6 miscarriages.   Quit smoking -Smoked 1ppd for 10 yrs.  No EtOH, no drugs currently, 2 caffeinated drinks per day.  Walks 2-3x/wk.  Review of Systems       see HPI  Physical  Exam  General:  VS: T 98.2  P 85  Wt 249.4  R 12 Gen: Alert, well appearing, oriented x 4. HEENT: Scalp without lesions or hair loss.  Ears: EACs clear, normal epithelium.  TMs with good light reflex and landmarks bilaterally.  Eyes: no injection, icteris, swelling, or exudate.  EOMI, PERRLA. Nose: no drainage or turbinate edema/swelling.  No injection or focal lesion.  Mouth: lips without lesion/swelling.  Oral mucosa pink and moist.  Dentition intact and without obvious caries or gingival swelling.  Oropharynx without erythema, exudate, or swelling.  Neck: mild diffuse TTP in posterior neck soft tissues.  No nuchal rigidity.  No LAD or TM. Chest: symmetric expansion, with nonlabored respirations.  Clear and equal breath sounds in all lung fields.   CV: RRR, no m/r/g.  Peripheral pulses 2+/symmetric. ABD: soft, nontender/nondistended. EXT: no c/c/e SKIN: no rash.    Impression & Recommendations:  Problem # 1:  VIRAL URI (ICD-465.9) Assessment New With persistent viral HA/neck myalgias. No sign of SBI.   Looks good today.  Reassured, discussed symptomatic care with OTC meds.  Watch for signs of worsening illness.  Complete Medication List: 1)  Multivitamins Tabs (Multiple vitamin) .... Take 1 tablet by mouth once a day   Orders Added: 1)  Est. Patient Level III [16109]

## 2010-04-09 LAB — CBC
Hemoglobin: 11.6 g/dL — ABNORMAL LOW (ref 12.0–15.0)
MCHC: 34.6 g/dL (ref 30.0–36.0)
RDW: 14.8 % (ref 11.5–15.5)

## 2010-04-11 LAB — URINALYSIS, ROUTINE W REFLEX MICROSCOPIC
Glucose, UA: NEGATIVE mg/dL
Ketones, ur: >80 mg/dL — AB
Protein, ur: NEGATIVE mg/dL
Urobilinogen, UA: 0.2 mg/dL (ref 0.0–1.0)

## 2010-04-27 LAB — POCT PREGNANCY, URINE: Preg Test, Ur: POSITIVE

## 2010-05-02 LAB — CBC
HCT: 33.9 % — ABNORMAL LOW (ref 36.0–46.0)
MCHC: 34.2 g/dL (ref 30.0–36.0)
MCV: 84 fL (ref 78.0–100.0)
Platelets: 354 10*3/uL (ref 150–400)
RBC: 4.03 MIL/uL (ref 3.87–5.11)
WBC: 11.6 10*3/uL — ABNORMAL HIGH (ref 4.0–10.5)

## 2010-05-02 LAB — ABO/RH: ABO/RH(D): A POS

## 2010-06-05 NOTE — Assessment & Plan Note (Signed)
[name], [name]               ACCOUNT NO.:  [id number]   MEDICAL RECORD NO.:  [id number]          PATIENT TYPE:  POB   LOCATION:  [facility] [place]         FACILITY:  [facility]   PHYSICIAN:  Sid Falcon, CNM  DATE OF BIRTH:  1975-10-21   DATE OF SERVICE:  07/21/2009                                  CLINIC NOTE   REASON FOR VISIT:  Postpartum visit.   The patient is here status post a normal spontaneous vaginal delivery on  Jun 04, 2009.  The patient did have her delivery en route to hospital in  vehicle without complications, arrived to the hospital where the  placenta was delivered by Deirdre Poe.  No lacerations and normal 24-  hour postpartum recovery.  The patient reports that bleeding continued  up until a week and half ago.  No clots or bleeding at this time.  Has  not resumed sexual intercourse.  Declines family planning at this time  and understands the risks of pregnancy.  The patient is also breast-  feeding well and did not have any emotional issues such as anxiety,  depression after delivery and believes that it is due to having such a  pleasant birth experience.  The patient has no additional concerns at  this time.   PHYSICAL EXAMINATION:  VITAL SIGNS:  Pulse 77, blood pressure 112/80,  weight 240.  LUNGS:  Clear to auscultation.  CARDIOVASCULAR:  Regular rate and rhythm without murmurs, gallops, or  rubs.  ABDOMEN:  Soft, nontender.   ASSESSMENT:  Postpartum exam.   PLAN:  Well-woman exam due in April of next year.  The patient will  notify us for any family planning needs or GYN issues.      Sid Falcon, CNM     WM/MEDQ  D:  07/21/2009  T:  07/21/2009  Job:  [id number]

## 2010-06-05 NOTE — Assessment & Plan Note (Signed)
[name], [name]               ACCOUNT NO.:  [id number]   MEDICAL RECORD NO.:  [id number]          PATIENT TYPE:  POB   LOCATION:  [facility] [place]         FACILITY:  [facility]   PHYSICIAN:  Caren Griffins, CNM       DATE OF BIRTH:  03/11/1975   DATE OF SERVICE:  05/27/2008                                  CLINIC NOTE   REASON FOR VISIT:  Followup SAB.   HISTORY:  This is a G6, P4-0-2-4 who had begun prenatal care here and is  well into the practices.  She had her last pregnancy and birth with [place]  as well.  She was initially seen at 6+ weeks in March, noted to have  early IUP 6+ weeks on April 18.  She was seen for vaginal spotting and  noted to have a 10-week size fetal demise on April 22.  She had heavy  bleeding and was seen at [facility] and found to have SAB in  progress.  Dr. Shawnie Pons sent products of conception to pathology which were  partially removed by instrumentation.  This path report shows chorionic  villi present without evidence of high data form change and this is  shared with the patient today.  She is having only scant brown spotting  at this point.  No cramping.  Breast-feeding well.  Her milk supply is  good.  Her 84-month-old is thriving.   OBJECTIVE:  VITAL SIGNS:  Weight 240, BP 107/74.   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  Currently lactational, coping  well with her loss.   PLAN:  She declines any contraception and in fact is looking forward to  another pregnancy.  We discussed that it would make sense to abstain for  intercourse until she has a spontaneous menses at least once and to work  on her own diet, exercise, weight loss regimen.  I also discussed  continuing Kegel's.  I talked at length about her and her family's  coping with this loss.            ______________________________  Caren Griffins, CNM     DP/MEDQ  D:  05/27/2008  T:  05/27/2008  Job:  [id number]

## 2010-06-05 NOTE — Assessment & Plan Note (Signed)
[name], FRISBY               ACCOUNT NO.:  [id number]   MEDICAL RECORD NO.:  [id number]          PATIENT TYPE:  POB   LOCATION:  [facility] [employer]         FACILITY:  [facility]   PHYSICIAN:  Caren Griffins, CNM       DATE OF BIRTH:  [birth year]   DATE OF SERVICE:                                  CLINIC NOTE   HISTORY:  This is a 35 year old G5, P4-0-1-4 who is now about 4 weeks  post NSVD.  She did present on September 18, 2007 for 6-week postpartum  visit and is here today to get her yearly Pap smear.  Of note, all her  Paps have been normal in the past and she has been getting them every  year.  We discussed the followup if this Pap is normal today that she  could go to every other year if she desires.  She is concerned primarily  today with weight gain.  Her weight is 244 which is about 20 pounds more  than her prepregnancy weight and of note, she weighed 228 at her 8-week  postpartum visit.  She denies specific symptoms of hypothyroidism other  than hair thinning.  She has no cold temperature intolerance.  She does  not feel that she has changed her eating and exercise habits and does  use a treadmill most days.  She was treated for postpartum depression  with Zoloft and she self discontinued that a couple of weeks ago but  couple of days ago has decided to start back and is now back on the  Zoloft.  She feels that it does control her symptoms.  She is still  breast-feeding without supplementing and is not interested in any method  other than what she has done in the past which is basal body temperature  and cervical mucus rhythm method.  She states she rarely has  intercourse.  She did have some spotting on December 11, 2007, and she  relates this to the baby sleeping at least 6 hours every night.   PHYSICAL EXAMINATION:  Not repeated in full but  VITAL SIGNS:  Weight is 244, height 5 feet 6 inches, pulse 91, BP  119/76.  GENERAL:  Normal affect and no apparent distress.  Thyroid  exam is  normal.  ABDOMEN:  Soft, obese, nontender and no organomegaly.  PELVIC:  NEFG, 1 to 2+ cystocele, fair tone, physiologic discharge.  Cervix clean.  Uterus poorly outlined, nontender.  No masses.  Adnexa  without tenderness or masses.   ASSESSMENT:  Normal GYN exam with significant recent weight gain.   PLAN:  Discussed her issues and advised her to increase her exercising  and do portion control more.  She plans to wait at least a year and half  before becoming pregnant again and is aware of the more reliable  contraceptive options which she declines.  TSH is sent today.           ______________________________  Caren Griffins, CNM     DP/MEDQ  D:  12/14/2007  T:  12/15/2007  Job:  [id number]

## 2010-06-05 NOTE — Assessment & Plan Note (Signed)
[name], [name]               ACCOUNT NO.:  [id number]   MEDICAL RECORD NO.:  [id number]          PATIENT TYPE:  POB   LOCATION:  [facility] [place]         FACILITY:  [facility]   PHYSICIAN:  Sid Falcon, CNM  DATE OF BIRTH:  [birth year]   DATE OF SERVICE:                                  CLINIC NOTE   The patient is here for recurring pelvic pain that occurs in the  evening.  Denies UTI symptoms.  No abnormal vaginal discharge.  Bladder  pressure towards the end of the evening.   On exam, pulse 76, blood pressure 108/75.  Cervix, no abnormal  discharge, no abnormal lesion, negative cervical motion tenderness,  prolapse noted.   ASSESSMENT:  Pelvic pain.   PLAN:  Discussed the patient with increased childbirths, that it is  common to have no lower pelvic pressure towards the end of the day just  due to ambulation and such.  I have recommended Kegel exercises and  using an abdominal binder.  Wet prep was obtained and sent to lab.  The  patient will follow up for increased pain or symptoms.      Sid Falcon, CNM     WM/MEDQ  D:  02/05/2008  T:  02/06/2008  Job:  [id number]

## 2010-06-05 NOTE — Assessment & Plan Note (Signed)
[name], [name]               ACCOUNT NO.:  [id number]   MEDICAL RECORD NO.:  [id number]          PATIENT TYPE:  POB   LOCATION:  [facility] [place]         FACILITY:  [facility]   PHYSICIAN:  Sid Falcon, CNM  DATE OF BIRTH:  Dec 06, 1975   DATE OF SERVICE:                                  CLINIC NOTE   The patient is seen today for followup from Zoloft initiation secondary  to postpartum depression.  The patient had a normal spontaneous vaginal  delivery on August 06, 2007, at [facility].  The patient reports  after taking Zoloft of 50 mg daily that she is feeling good, denies  feelings of sadness or hopelessness.  No suicidal ideation reports,  would like to continue the Zoloft and may consider weaning herself at a  later date that feels that she is doing fine.  The patient instructed  may continue Zoloft and is to follow up for well-woman exam in November  2009.      Sid Falcon, CNM     WM/MEDQ  D:  10/19/2007  T:  10/20/2007  Job:  [id number]

## 2010-06-05 NOTE — Assessment & Plan Note (Signed)
[name], [name]               ACCOUNT NO.:  [id number]   MEDICAL RECORD NO.:  [id number]          PATIENT TYPE:  POB   LOCATION:  [facility] [place]         FACILITY:  [facility]   PHYSICIAN:  Caren Griffins, CNM       DATE OF BIRTH:  [birth year]   DATE OF SERVICE:  08/14/2007                                  CLINIC NOTE   REASON FOR VISIT:  Breastfeeding concerns.   HISTORY OF PRESENT ILLNESS:  [name] is 1 week post NSVD which was  essentially uncomplicated.  She is concerned about the baby latching on  poorly.  However, he is thriving.  He sleeps for up to 2 to 3 hours, is  wetting plenty of diapers, and has now gained weight up to 10 ounces  above previous weight.  She has been to the family practice doctor twice  for the baby.  Her concern is that the baby sucks vigorously until she  has a letdown reflex and then kinds of tongues the nipple and spends  quite a long time nursing.  She is not engorged and has no nipple  excoriation or bleeding.  Most of what I suggested as far as different  positions, pushing the baby's chin down, pulling her areola to make the  nipple protrude are all things she has already tired and so I mostly  reassured her that the baby is thriving, and she does not have much to  worry about.  She is also wanting to discuss her labor and delivery  experience, which we did at length.  She is to come back p.r.n. and will  come here for a 6-week check.  We will again discuss family planning at  that time, but for religious reasons she probably will not use birth  control methods.           ______________________________  Caren Griffins, CNM     DP/MEDQ  D:  08/14/2007  T:  08/14/2007  Job:  [id number]

## 2010-06-05 NOTE — Assessment & Plan Note (Signed)
[name], LABELL               ACCOUNT NO.:  [id number]   MEDICAL RECORD NO.:  [id number]          PATIENT TYPE:  POB   LOCATION:  [facility] [place]         FACILITY:  [facility]   PHYSICIAN:  Maylon Cos, CNM    DATE OF BIRTH:  12-Apr-1975   DATE OF SERVICE:                                  CLINIC NOTE   The patient is being seen today, September 18, 2007, in the [place]  office.  She presents today for a 6-week postpartum visit.  [name] is a  35 year old Caucasian female, who had a normal spontaneous vaginal  delivery of a female infant on August 06, 2007 at the [facility].  She  was induced secondary for LGA.  She received 1 dose of Cervidil and  labored spontaneously.  Her baby weighed 8 pounds and 7 ounces and was  20.75 inches with Apgars of 9 and 9.  She is doing well today.  She does  have some complaints of breast pain.  She is breast-feeding completely.  She is not supplementing with formula.  She is pumping on occasion;  however, she reports that he does not take a bottle or pacifier, and  oftentimes he uses her as a pacifier, and she is reporting sore nipples.  She does report that he appears to have thrush and is seeing the  pediatrician today.  She also states that she is getting very minimal  sleep at night.  She has 2 other small children in the home that are  home-schooled and reports getting approximately 4 hours of sleep in the  last 24 hours.  She is teary during our conversation today, mainly in  recounting her birth.  She had lots of regrets and sadness due to the  delivery.  She had planned a natural birth and was disappointed by her  need for an induction of labor and her decision to get an epidural.  She  was disappointed and felt defeated with her need for induction; however,  she does verbalized that she understands the need based on the  recommendations that she was given by the midwives in our practice and  Dr. Penne Lash.  She did receive a second  opinion from Dr. Shawnie Pons secondary  to our recommendations.  We did not recommend a second opinion; however,  she sought a second opinion.  Dr. Shawnie Pons agreed with every information we  had given to her, and she went along with our plan of care.  She has  much sadness though still related to getting an epidural, and this was  the process under which her baby was born.  However, she had a normal  vaginal delivery and has a very healthy child, but she is sleep  deprived, she is teary, she does seem to be in need of more adult  contact.  She is married and has a very supportive husband who works  outside the home and is only there to assist with the children in the  evenings.  She does relate she has symptoms of depressions, but does not  feel that she is in danger of harming herself or her children.  She  would like to be placed on  medications for her symptoms.  We discussed  at length other coping mechanisms for depression and also ways to  increase her rest period during the day and the importance of adequate  sleep nutrition and hydration during postpartum period and also while  breast-feeding.  She does appear to have adequate milk supply.  Baby is  present during today's visit, and he is sleeping and appears to be a  well-nourished infant.  She reports that she has not resumed  intercourse, and she still has a scant amount of vaginal bleeding that  requires a Panty Liner.  She declines hormonal birth control secondary  to religious beliefs, and they plan to use condoms once they resume  intercourse.   PHYSICAL EXAMINATION:  On examination today, her vital signs are stable.  Her blood pressure is 107/67.  Her weight today is 235, which is 7  pounds less than her 1-week postpartum visit and 30 pounds lighter than  her last prenatal visit.  She continues to work on her weight  management.  Her exam today is problem focused on her breasts.  She does  appear to have the beginning of some  intraductal yeast.  Her nipples are  erect without discharge, but they are reddened and tender to palpation.  There is no streaking.  They are not warm to touch or hard.  There are  no signs and symptoms of mastitis as small amount of milk was easily  expressed with gentle palpation.  On abdominal exam, her rectus muscles  are closing well.  Pelvic exam was deferred secondary to no complaints.   ASSESSMENT AND PLAN:  1. Six-week postpartum visit, doing well.  2. Postpartum depression.  Prescription for Zoloft 25 mg x 7 days,      then increase to 50 mg p.o. daily.  We will follow up in 4 weeks in      the office for med check and discuss counseling options, which she      declines at this time.  3. Breast-feeding without difficulty.  Discussed the option of      [place] secondary to concerns of decreased milk supply on Zoloft;      however, we discussed at length the minimal risk of decreased milk      supply on this medication.  4. Intraductal yeast.  Prescription given for Diflucan 300 mg p.o.      daily x7 days.  5. Health maintenance.  The patient is encouraged to increase      activity.  She is released to do abdominal exercises.  Core      strengthening handout given.  Encouraged to increase p.o. hydration      with non-caffeinated beverages.  Encouraged to increase sleeping      and as much as possible continue multivitamin.  Physical exam with      Pap smear is due after December 16, 2007.           ______________________________  Maylon Cos, CNM     SS/MEDQ  D:  09/18/2007  T:  09/19/2007  Job:  [phone]

## 2010-06-05 NOTE — Assessment & Plan Note (Signed)
[name], WADLEY               ACCOUNT NO.:  [id number]   MEDICAL RECORD NO.:  [id number]          PATIENT TYPE:  POB   LOCATION:  [facility] [place]         FACILITY:  [facility]   PHYSICIAN:  Caren Griffins, CNM       DATE OF BIRTH:  February 28, 1975   DATE OF SERVICE:  02/01/2008                                  CLINIC NOTE   HISTORY:  Makinzy is now 6 months postpartum from her fourth vaginal  delivery and comes in today due to concerned that she has had pelvic  pressure and urinary urgency for about 3 days.  She feels it is  beginning of a UTI.  Also of note, she has had yeast infection of her  nipples.  Nipples are slightly redden.  No surrounding erythema, some  evidence of changing __________apply to nipples, and the baby has had  thrush in the mouth and skin.  She is unaware of yeast-type vaginal  discharge.  She is breast feeding without supplementation still and has,  however, had scant episode of spotting to light bleeding one time a  couple of weeks ago.  She has also been feeling some lower back  discomfort, which is worse when she is moving around.  She denies any  problems with postpartum depression, which she has had in the past.  She  states that all the children have had a viral illness, one daughter has  had a fever, and she herself has upper respiratory congestion and  malaise, but has not had a fever or cough.  Her children are cared for  by Dr. Linford Arnold and will be seen there today.   OBJECTIVE:  Urinalysis shows essentially negative dip stick except for  trace of blood, pH is 6.5, nitrites, and leukocyte esterase is negative.  This was sent for culture.  Her temperature is 98, pulse 102, BP120/70.  Abdominal exam is benign.  Pelvic is deferred.   ASSESSMENT:  Possible early urinary tract infection, viral upper  respiratory illness, yeast infection of nipples.   PLAN:  She is given a prescription for Diflucan presumptively and  hopefully this will eradicate any  possible yeast infection as well as of  her vagina as well as the nipples.  She is also advised that she can  speak with a lactation consultants particularly if this gets worse.  We  will await the result of urine culture and let her know about that, and  otherwise, she is to let us know if her symptoms are not self limited in  the next few days.  At that point, we will bring her back for a pelvic  exam.           ______________________________  Caren Griffins, CNM     DP/MEDQ  D:  02/01/2008  T:  02/02/2008  Job:  [phone]

## 2010-06-08 NOTE — H&P (Signed)
[facility]  Patient:    [name], [name]                     MRN: [id number] Adm. Date:  [id number] Attending:  Geoffery Lyons A Dictator:   Candi Leash. Theressa Stamps, N.P.                   Psychiatric Admission Assessment  PATIENT IDENTIFICATION:  This is a 35 year old single white female voluntarily admitted for heroin dependence wanting to be detoxified.  HISTORY OF PRESENT ILLNESS:  The patient presents with a history of heroin addiction for the past two to three years, using a "bundle to a bundle and a half" of heroin each day.  She states that is approximately $100 worth.  The patient has been using the heroin to help her out in school to "calm her mind."  She states she is unable to do well in her studies because she has a diagnosis of ADHD and she had problems with having poor grades and having difficulty focusing.  The patient was planning to get detoxified after school was over.  The patient has been obtaining the money from her parents who have no idea what she does and she does not want her parents to know about her substance abuse.  The patient reports that she has been sleeping well. Appetite has been fair.  She reports no weight change.  Denies any hallucinations or paranoia.  The patient also admits to using cocaine, last use 24 hours prior to admission, and the patient is currently experiencing withdrawal symptoms.  PAST PSYCHIATRIC HISTORY:  History of detoxification about seven years ago in [place].  History of ADHD by history; she has not been on any medications.  She does not see a therapist or a psychiatrist.  SUBSTANCE ABUSE HISTORY:  Smokes one pack a day for 10+ years, rare alcohol use.  Drug of choice is heroin, as stated uses about $100 worth per day, uses intravenously, began using it in 1999.  The patient also has been using cocaine on an occasional basis; last use was within 24 hours.  The patient smokes the cocaine and  sometimes injects it.  PAST MEDICAL HISTORY:  Primary care Gianni Fuchs: None.  Medical problems: Pericarditis and pleurisy diagnosed about a month ago.  She also has a history of viral pneumonia in February that is resolved.  Medications: Valtrex 75 mg b.i.d. prescribed by Dr. Selina Cooley in [place].  Drug allergies: PENICILLIN; the patient states she gets short of breath.  Physical examination: The patient went to [facility] yesterday seeking help.  We are awaiting physical examination and any labs that were obtained there.  SOCIAL HISTORY:  She is a 35 year old single white female.  She has one child that is 33 years of age.  The patient placed the child up for adoption.  She lives with her boyfriend who is also addicted to heroin and is currently in treatment for detoxification.  She is attending school at [employer].  She has one year to complete.  She is getting her BS in biology. She was accepted into medical school.  The patient has no court dates or legal problems.  Family history: The patient is adopted.  MENTAL STATUS EXAMINATION:  Alert, young Caucasian adult, casually dressed. Somewhat cooperative.  The patient needed to go out and smoke before she came to the interview and stated she had not slept.  Fair eye contact.  Speech is low  tone, relevant, no initiation of any conversation.  Mood is irritable. Affect is irritable.  Thought process are coherent, no evidence of psychosis, no suicidal or homicidal ideation, denies any auditory or visual hallucinations, no paranoia.  Cognitive functioning is intact.  Memory is good.  Judgment is impaired.  Insight is fair.  Poor impulse control.  Appears to be of average to above average intelligence.  ADMISSION DIAGNOSES: Axis I:    1. Polysubstance abuse.            2. Attention-deficit/hyperactivity disorder by history. Axis II:   Deferred. Axis III:  1. Pericarditis.            2. Shingles; the patient had  an episode of shingles a few months               back and has been on Valtrex for that. Axis IV:   Moderate psychosocial stressors relating to heroin dependence. Axis V:    Current is 50, past year 68.  INITIAL PLAN OF CARE:  Voluntary admission to [facility] for heroin dependence and requesting detoxification.  Contract for safety, check every 15 minutes; the patient will promise safety.  Will initiate a Wytensin protocol.  Will obtain labs.  Will have trazodone available for sleep.  Plan is for the patient to attend the [facility] after discharge, to return to her prior living arrangement, and for the patient to remain clean.  ESTIMATED LENGTH OF STAY:  Three to five days. DD:  06/10/00 TD:  06/10/00 Job: [id number]

## 2010-06-08 NOTE — Discharge Summary (Signed)
[facility]  Patient:    [name], [name]                     MRN: [id number] Adm. Date:  [id number] Disc. Date: [id number] Attending:  Geoffery Lyons A Dictator:   Candi Leash. Orsini, N.P.                           Discharge Summary  PATIENT IDENTIFICATION:  This is a 35 year old single white female, voluntarily admitted for heroin dependence, wanting to be detoxed.  The patient has had a history of heroin addiction for the past 2-3 years, using approximately $100 worth each day.  The patient has had difficulty at school. She has been using heroin to help her "calm her mind."  The patient was planning to be detoxified after school was over.  The patient has been sleeping well.  Appetite has been fair with no weight change.  No hallucinations or paranoia.  PAST MEDICAL HISTORY:  The primary care Linkon Siverson is not known.  The patient has a history of pericarditis and pleurisy, diagnosed about a month ago with a history of viable pneumonia.  CURRENT MEDICATIONS:   Valtrex 75 mg b.i.d.  DRUG ALLERGIES:  PENICILLIN.  PHYSICAL EXAMINATION:  Performed at [facility] Emergency Department.  MENTAL STATUS EXAMINATION:  She is an alert, young Caucasian female, casually dressed, somewhat cooperative.  The patient needed to go out and smoke before she came to the interview.  Fair eye contact.  Speech is low tone and relevant.  No initiation of any conversation.  Mood is irritable.  Affect is irritable.  Thought processes are coherent with no evidence of psychosis.  No suicidal or homicidal ideation or auditory or visual hallucinations.  No paranoia.  Cognitive functioning is intact.  Memory is good.  Judgment is impaired.  Insight is fair.  Poor impulse control.  Appears to be of average to above average intelligence.  ADMITTING DIAGNOSES: Axis I:    1. Polysubstance abuse.            2. Attention deficit hyperactivity disorder by history. Axis II:    Deferred. Axis III:  1. Pericarditis.            2. Shingles. Axis IV:   Moderate psychosocial stressors related to heroin dependence. Axis V:    Current is 52, past year 35.  HOSPITAL COURSE:  This is a voluntary admission for heroin dependence requesting detox.  The patient will be put on the Wytensin protocol and have extra Trazodone available for sleep.  The patient will attend the [facility] program after discharge and return to her prior living arrangements and to prevent relapse.  The patient was doing fairly well.  She reported some symptoms of depression.  She had been on SSRI in the past.  Our plan was to put the patient on Effexor after she was detoxed.  The patient was experiencing some withdrawal symptoms.  It was closely monitored.  CONDITION UPON DISCHARGE:  The patient was expressing understanding of what she needs to do to maintain abstinence.  She will attend a Step 1 program and get follow-up treatment.  She wants to do it on her own.  The patient was denying any suicidal or homicidal ideation.  Her mood and affect and behavior were appropriate to situation.  It was felt that the patient was able to be discharged.  DISCHARGE INSTRUCTIONS:  The patient was to attend a  12 step program.  Will call Step 1 for her own follow-up.  She will ask her primary care Dominic Rhome to prescribe her medications.  DISCHARGE MEDICATIONS: 1. Effexor XR 37.5 mg one in the morning for seven days, then increase to    Effexor XR 75 mg one in the morning after that. 2. Trazodone 50 mg 1-2 at h.s. p.r.n. for sleep.  DISCHARGE DIAGNOSES: Axis I:    1. Opiate abuse/dependence.            2. Attention deficit hyperactivity disorder by history. Axis II:   Deferred. Axis III:  1. Pericarditis.            2. Shingles. Axis IV:   Minor psychosocial stressors. Axis V:    Current is 71, this past year 47. DD:  07/22/00 TD:  07/22/00 Job: [id number]

## 2010-06-15 ENCOUNTER — Encounter: Payer: Self-pay | Admitting: Family Medicine

## 2010-06-15 ENCOUNTER — Ambulatory Visit (INDEPENDENT_AMBULATORY_CARE_PROVIDER_SITE_OTHER): Payer: Managed Care, Other (non HMO) | Admitting: Family Medicine

## 2010-06-15 DIAGNOSIS — R599 Enlarged lymph nodes, unspecified: Secondary | ICD-10-CM

## 2010-06-15 DIAGNOSIS — R590 Localized enlarged lymph nodes: Secondary | ICD-10-CM

## 2010-06-15 NOTE — Progress Notes (Signed)
  Subjective:    Patient ID: [name], female    DOB: 01/03/1976, 35 y.o.   MRN: [id number]  HPI Was on high doses of progesterone during the early part of her pregnancy, starting a couple of weeks again.  She is also pregnancy last Wednesday and discontinued the progesterone She says her mildk let down after she lost the pregnancy.  Her LN were swollen in both her axilla and her groin. The ones in her groin have resolved. Stil has a lot of pain and tenderness esp in the right axilla. No fever. No redness.  She no longer is lactating.   Review of Systems     Objective:   Physical Exam She does have one very small palpable lymph node in left axilla. Unable to palpate about 3 very small lymph nodes in the right axilla. There is no skin changes or erythema or any evidence of cellulitis. There is no evidence of mastitis or changes in the breast tissue.       Assessment & Plan:  Lymphadenopathy-in the axilla. I really would encourage her to give this one more week to see if it resolves on its own. This may be in response to hormones and pregnancy loss. I do not see any source of infection but we certainly could consider a trial of an antibiotic for lymphadenopathy if her symptoms don't improve in one week. We could also consider a mammogram and ultrasound to evaluate the lymph nodes if they do not resolve in the next week.

## 2010-09-26 ENCOUNTER — Other Ambulatory Visit: Payer: Managed Care, Other (non HMO) | Admitting: Obstetrics & Gynecology

## 2010-10-01 ENCOUNTER — Ambulatory Visit (INDEPENDENT_AMBULATORY_CARE_PROVIDER_SITE_OTHER): Payer: Managed Care, Other (non HMO) | Admitting: Advanced Practice Midwife

## 2010-10-01 VITALS — BP 130/83 | Temp 98.6°F | Wt 253.0 lb

## 2010-10-01 DIAGNOSIS — O0940 Supervision of pregnancy with grand multiparity, unspecified trimester: Secondary | ICD-10-CM

## 2010-10-01 DIAGNOSIS — E669 Obesity, unspecified: Secondary | ICD-10-CM

## 2010-10-01 NOTE — Progress Notes (Signed)
Subjective:    [name] is a [id number] [redacted]w[redacted]d being seen today for her first obstetrical visit.  Her obstetrical history is significant for pre-eclampsia and short interpregnancy interval and multiple early pregnancy losses. . Patient does intend to breast feed. Pregnancy history fully reviewed.  Patient reports no complaints.  Filed Vitals:   10/01/10 1402  BP: 130/83  Temp: 98.6 F (37 C)  Weight: 253 lb (114.76 kg)    HISTORY: OB History    Grav Para Term Preterm Abortions TAB SAB Ect Mult Living   15 6 5 1 8 1 7   5      # Outc Date GA Lbr Len/2nd Wgt Sex Del Anes PTL Lv   1 TRM 2/97 [redacted]w[redacted]d  7lb3oz(3.26kg) F SVD EPI No Yes   Comments: adopted   2 TAB 1/98           3 PRE 5/04   4lb13oz(2.183kg) F SVD EPI No Yes   4 TRM 1/06 [redacted]w[redacted]d  7lb13oz(3.544kg) F SVD None No Yes   5 TRM 7/09 [redacted]w[redacted]d  8lb7oz(3.827kg) M       6 TRM 5/11 [redacted]w[redacted]d  8lb3oz(3.714kg) F SVD None No Yes   Comments: delivered on route to hosiptal   7 TRM            8 SAB            9 SAB            10 SAB            11 SAB            12 SAB            13 SAB            14 SAB            15 CUR              Past Medical History  Diagnosis Date  . Miscarriage     x 8  . ADHD (attention deficit hyperactivity disorder)   . Chromosomal abnormality   . HSV (herpes simplex virus) anogenital infection    Past Surgical History  Procedure Date  . Dilation and curettage of uterus 1998  . Arm surgery for fracture   . Knee surgery    Family History  Problem Relation Age of Onset  . Autism spectrum disorder Daughter   . Other Daughter     Pancreatic insufficiency.      Exam    Uterine Size: size equals dates  Pelvic Exam: FHT 150   Bony Pelvis: proven to 8#7 oz  System:     Skin: normal coloration and turgor, no rashes    Neurologic: oriented, normal   Extremities: normal strength, tone, and muscle mass   HEENT PERRLA   Mouth/Teeth mucous membranes moist, pharynx normal without lesions   Neck supple and no masses   Cardiovascular: regular rate and rhythm   Respiratory:  appears well, vitals normal, no respiratory distress, acyanotic, normal RR   Abdomen: soft, non-tender; bowel sounds normal; no masses,  no organomegaly      Assessment:    Pregnancy: [id number] at 16.[redacted] weeks EGA Patient Active Problem List  Diagnoses  . HSV  . ATTENTION DEFICIT, W/HYPERACTIVITY  . SLE  . VIRAL URI        Plan:     Initial labs drawn. Pt declines all STI testing. Blood type A pos, on file from prior pregnancy.  Prenatal vitamins. Problem list reviewed and updated. Genetic Screening discussed Quad Screen: declined.  Ultrasound discussed; fetal survey: ordered for 22 weeks, per patient request  Follow up in 6 weeks, per patient request    [facility] 10/01/2010

## 2010-10-01 NOTE — Progress Notes (Signed)
Recent transfer from [facility] in [place]

## 2010-10-06 LAB — CBC
Hemoglobin: 11.7 g/dL — ABNORMAL LOW (ref 12.0–15.0)
MCH: 27.3 pg (ref 26.0–34.0)
MCHC: 32.8 g/dL (ref 30.0–36.0)
MCV: 83.4 fL (ref 78.0–100.0)
RBC: 4.28 MIL/uL (ref 3.87–5.11)

## 2010-10-10 ENCOUNTER — Encounter: Payer: Self-pay | Admitting: *Deleted

## 2010-10-10 DIAGNOSIS — O0940 Supervision of pregnancy with grand multiparity, unspecified trimester: Secondary | ICD-10-CM

## 2010-10-12 ENCOUNTER — Encounter: Payer: Self-pay | Admitting: Emergency Medicine

## 2010-10-12 ENCOUNTER — Inpatient Hospital Stay (INDEPENDENT_AMBULATORY_CARE_PROVIDER_SITE_OTHER)
Admission: RE | Admit: 2010-10-12 | Discharge: 2010-10-12 | Disposition: A | Discharge status: Home / Self Care | Payer: Managed Care, Other (non HMO) | Source: Ambulatory Visit | Attending: Emergency Medicine | Admitting: Emergency Medicine

## 2010-10-12 DIAGNOSIS — T2200XA Burn of unspecified degree of shoulder and upper limb, except wrist and hand, unspecified site, initial encounter: Secondary | ICD-10-CM

## 2010-10-19 LAB — CBC
HCT: 34.1 — ABNORMAL LOW
Hemoglobin: 11.5 — ABNORMAL LOW
MCHC: 33.8
Platelets: 242
RDW: 15.5

## 2010-11-15 ENCOUNTER — Encounter: Payer: Self-pay | Admitting: *Deleted

## 2010-11-16 ENCOUNTER — Encounter: Payer: Self-pay | Admitting: Advanced Practice Midwife

## 2010-11-16 ENCOUNTER — Ambulatory Visit (INDEPENDENT_AMBULATORY_CARE_PROVIDER_SITE_OTHER): Payer: Managed Care, Other (non HMO) | Admitting: Advanced Practice Midwife

## 2010-11-16 VITALS — BP 116/72 | Temp 98.6°F | Wt 254.0 lb

## 2010-11-16 DIAGNOSIS — Q999 Chromosomal abnormality, unspecified: Secondary | ICD-10-CM

## 2010-11-16 DIAGNOSIS — N96 Recurrent pregnancy loss: Secondary | ICD-10-CM

## 2010-11-16 DIAGNOSIS — O0940 Supervision of pregnancy with grand multiparity, unspecified trimester: Secondary | ICD-10-CM

## 2010-11-16 NOTE — Progress Notes (Signed)
p-87 increase of cluster headaches.

## 2010-11-19 ENCOUNTER — Encounter: Payer: Self-pay | Admitting: Advanced Practice Midwife

## 2010-11-19 DIAGNOSIS — Q999 Chromosomal abnormality, unspecified: Secondary | ICD-10-CM | POA: Insufficient documentation

## 2010-11-19 DIAGNOSIS — O2620 Pregnancy care for patient with recurrent pregnancy loss, unspecified trimester: Secondary | ICD-10-CM | POA: Insufficient documentation

## 2010-11-19 NOTE — Progress Notes (Signed)
Doing well. No contractions or bleeding. Plan Glucola next month.

## 2010-11-23 ENCOUNTER — Ambulatory Visit (HOSPITAL_COMMUNITY)
Admission: RE | Admit: 2010-11-23 | Discharge: 2010-11-23 | Disposition: A | Discharge status: Home / Self Care | Payer: Managed Care, Other (non HMO) | Source: Ambulatory Visit | Attending: Advanced Practice Midwife | Admitting: Advanced Practice Midwife

## 2010-11-23 DIAGNOSIS — O2620 Pregnancy care for patient with recurrent pregnancy loss, unspecified trimester: Secondary | ICD-10-CM | POA: Insufficient documentation

## 2010-11-23 DIAGNOSIS — O09219 Supervision of pregnancy with history of pre-term labor, unspecified trimester: Secondary | ICD-10-CM | POA: Insufficient documentation

## 2010-11-23 DIAGNOSIS — Z8751 Personal history of pre-term labor: Secondary | ICD-10-CM | POA: Insufficient documentation

## 2010-11-23 DIAGNOSIS — O0940 Supervision of pregnancy with grand multiparity, unspecified trimester: Secondary | ICD-10-CM

## 2010-11-23 DIAGNOSIS — O358XX0 Maternal care for other (suspected) fetal abnormality and damage, not applicable or unspecified: Secondary | ICD-10-CM | POA: Insufficient documentation

## 2010-11-23 DIAGNOSIS — Z1389 Encounter for screening for other disorder: Secondary | ICD-10-CM | POA: Insufficient documentation

## 2010-11-23 DIAGNOSIS — Z363 Encounter for antenatal screening for malformations: Secondary | ICD-10-CM | POA: Insufficient documentation

## 2010-12-24 NOTE — Progress Notes (Signed)
Summary: BURN ON LT ARM...WSE   Vital Signs:  Patient Profile:   35 Years Old Female CC:      Burn on left forearm x 2 days Height:     66.5 inches (168.91 cm) Weight:      252 pounds O2 Sat:      99 % O2 treatment:    Room Air Temp:     98.4 degrees F oral Pulse rate:   86 / minute Pulse rhythm:   regular Resp:     18 per minute BP sitting:   117 / 72  (left arm) Cuff size:   large  Vitals Entered By: Emilio Math (October 12, 2010 3:47 PM)                  Current Allergies: No known allergies History of Present Illness Chief Complaint: Burn on left forearm x 2 days History of Present Illness: Burn on L forearm a few days ago.  She just wants to make sure it's ok.  It feels a little swollen, but no redness, pus, drainage.  She isn't sure about Td status but doesn't want one anyway.  No F/C/N/V.  She has been using Silvadene which is helping.  She is leaving town tomorrow and would like to make sure it's ok.  She is [redacted] weeks pregnant.  Current Meds CEPHALEXIN 500 MG CAPS (CEPHALEXIN) 1 by mouth three times a day for 1 week PRENATAL/IRON  TABS (PRENATAL MULTIVIT-MIN-FE-FA)  CALCIUM-MAGNESIUM 500-250 MG TABS (CALCIUM-MAGNESIUM)  FISH OIL 300 MG CAPS (OMEGA-3 FATTY ACIDS)   REVIEW OF SYSTEMS Constitutional Symptoms      Denies fever, chills, night sweats, weight loss, weight gain, and fatigue.  Eyes       Denies change in vision, eye pain, eye discharge, glasses, contact lenses, and eye surgery. Ear/Nose/Throat/Mouth       Denies hearing loss/aids, change in hearing, ear pain, ear discharge, dizziness, frequent runny nose, frequent nose bleeds, sinus problems, sore throat, hoarseness, and tooth pain or bleeding.  Respiratory       Denies dry cough, productive cough, wheezing, shortness of breath, asthma, bronchitis, and emphysema/COPD.  Cardiovascular       Denies murmurs, chest pain, and tires easily with exhertion.    Gastrointestinal       Denies stomach pain,  nausea/vomiting, diarrhea, constipation, blood in bowel movements, and indigestion. Genitourniary       Denies painful urination, kidney stones, and loss of urinary control. Neurological       Denies paralysis, seizures, and fainting/blackouts. Musculoskeletal       Denies muscle pain, joint pain, joint stiffness, decreased range of motion, redness, swelling, muscle weakness, and gout.  Skin       Denies bruising, unusual mles/lumps or sores, and hair/skin or nail changes.      Comments: Burn Psych       Denies mood changes, temper/anger issues, anxiety/stress, speech problems, depression, and sleep problems.  Past History:  Past Medical History: Reviewed history from 03/08/2010 and no changes required. 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  Past Surgical History: Reviewed history from 06/14/2009 and no changes required. D & C 1998 Surgery for arm fracture Arthroscopic knee surgery  Family History: Reviewed history from 06/14/2009 and no changes required. Daughter with autism and pancreatic  Adopted  Social History: Reviewed history from 03/08/2010 and no  changes required. Homemaker.  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   Married, 4 children.  Has had 6 miscarriages.   Quit smoking -Smoked 1ppd for 10 yrs.  No EtOH, no drugs currently, 2 caffeinated drinks per day.  Walks 2-3x/wk. Physical Exam General appearance: well developed, well nourished, no acute distress MSE: oriented to time, place, and person L forearm with 1.5 x 3cm oval area of healilng burn.  Slight swelling around, but no redness, tenderness, drainage.  It doesn't appear to be infected and there is good granulation/healing tissue.  FROM of arm/elbow/wrist with normal NV distal  status. Assessment New Problems: BURN, FOREARM (ICD-943.00)   Plan New Medications/Changes: CEPHALEXIN 500 MG CAPS (CEPHALEXIN) 1 by mouth three times a day for 1 week  #21 x 0, 10/12/2010, Hoyt Koch MD  New Orders: Est. Patient Level II [phone] Planning Comments:   Keep clean and dry.  I don't feel it is infected, but I wrote her Keflex to hold on to.  It is Class B for pregnancy and she understands and agrees not take unless deteriorating/redness/cellulitis/pus/fever.  Continue Silvadene as directed and keep protected.    The patient and/or caregiver has been counseled thoroughly with regard to medications prescribed including dosage, schedule, interactions, rationale for use, and possible side effects and they verbalize understanding.  Diagnoses and expected course of recovery discussed and will return if not improved as expected or if the condition worsens. Patient and/or caregiver verbalized understanding.  Prescriptions: CEPHALEXIN 500 MG CAPS (CEPHALEXIN) 1 by mouth three times a day for 1 week  #21 x 0   Entered and Authorized by:   Hoyt Koch MD   Signed by:   Hoyt Koch MD on 10/12/2010   Method used:   Print then Give to Patient   RxID:   [id number]   Orders Added: 1)  Est. Patient Level II [36644]

## 2010-12-28 ENCOUNTER — Ambulatory Visit (INDEPENDENT_AMBULATORY_CARE_PROVIDER_SITE_OTHER): Payer: Managed Care, Other (non HMO) | Admitting: Obstetrics and Gynecology

## 2010-12-28 VITALS — BP 129/75 | Temp 96.4°F | Wt 255.0 lb

## 2010-12-28 DIAGNOSIS — Z641 Problems related to multiparity: Secondary | ICD-10-CM

## 2010-12-28 DIAGNOSIS — Z3480 Encounter for supervision of other normal pregnancy, unspecified trimester: Secondary | ICD-10-CM

## 2010-12-28 DIAGNOSIS — Z3490 Encounter for supervision of normal pregnancy, unspecified, unspecified trimester: Secondary | ICD-10-CM

## 2010-12-28 NOTE — Progress Notes (Signed)
Has declined flu vaccine. Daughter positive. Knows sx's. Discussed hx precip delivery in car. Labs done. Will watch S:D.

## 2010-12-28 NOTE — Progress Notes (Signed)
p-96 Daughter has tested positive for FLU.  28 wk labs today.

## 2010-12-28 NOTE — Progress Notes (Signed)
Addended by: Granville Lewis on: 12/28/2010 09:11 AM   Modules accepted: Orders

## 2010-12-28 NOTE — Patient Instructions (Signed)
Pregnancy - Third Trimester The third trimester of pregnancy (the last 3 months) is a period of the most rapid growth for you and your baby. The baby approaches a length of 20 inches and a weight of 6 to 10 pounds. The baby is adding on fat and getting ready for life outside your body. While inside, babies have periods of sleeping and waking, suck their thumbs, and hiccups. You can often feel small contractions of the uterus. This is false labor. It is also called Braxton-Hicks contractions. This is like a practice for labor. The usual problems in this stage of pregnancy include more difficulty breathing, swelling of the hands and feet from water retention, and having to urinate more often because of the uterus and baby pressing on your bladder.  PRENATAL EXAMS  Blood work may continue to be done during prenatal exams. These tests are done to check on your health and the probable health of your baby. Blood work is used to follow your blood levels (hemoglobin). Anemia (low hemoglobin) is common during pregnancy. Iron and vitamins are given to help prevent this. You may also continue to be checked for diabetes. Some of the past blood tests may be done again.   The size of the uterus is measured during each visit. This makes sure your baby is growing properly according to your pregnancy dates.   Your blood pressure is checked every prenatal visit. This is to make sure you are not getting toxemia.   Your urine is checked every prenatal visit for infection, diabetes and protein.   Your weight is checked at each visit. This is done to make sure gains are happening at the suggested rate and that you and your baby are growing normally.   Sometimes, an ultrasound is performed to confirm the position and the proper growth and development of the baby. This is a test done that bounces harmless sound waves off the baby so your caregiver can more accurately determine due dates.   Discuss the type of pain  medication and anesthesia you will have during your labor and delivery.   Discuss the possibility and anesthesia if a Cesarean Section might be necessary.   Inform your caregiver if there is any mental or physical violence at home.  Sometimes, a specialized non-stress test, contraction stress test and biophysical profile are done to make sure the baby is not having a problem. Checking the amniotic fluid surrounding the baby is called an amniocentesis. The amniotic fluid is removed by sticking a needle into the belly (abdomen). This is sometimes done near the end of pregnancy if an early delivery is required. In this case, it is done to help make sure the baby's lungs are mature enough for the baby to live outside of the womb. If the lungs are not mature and it is unsafe to deliver the baby, an injection of cortisone medication is given to the mother 1 to 2 days before the delivery. This helps the baby's lungs mature and makes it safer to deliver the baby. CHANGES OCCURING IN THE THIRD TRIMESTER OF PREGNANCY Your body goes through many changes during pregnancy. They vary from person to person. Talk to your caregiver about changes you notice and are concerned about.  During the last trimester, you have probably had an increase in your appetite. It is normal to have cravings for certain foods. This varies from person to person and pregnancy to pregnancy.   You may begin to get stretch marks on your hips,   abdomen, and breasts. These are normal changes in the body during pregnancy. There are no exercises or medications to take which prevent this change.   Constipation may be treated with a stool softener or adding bulk to your diet. Drinking lots of fluids, fiber in vegetables, fruits, and whole grains are helpful.   Exercising is also helpful. If you have been very active up until your pregnancy, most of these activities can be continued during your pregnancy. If you have been less active, it is helpful  to start an exercise program such as walking. Consult your caregiver before starting exercise programs.   Avoid all smoking, alcohol, un-prescribed drugs, herbs and "street drugs" during your pregnancy. These chemicals affect the formation and growth of the baby. Avoid chemicals throughout the pregnancy to ensure the delivery of a healthy infant.   Backache, varicose veins and hemorrhoids may develop or get worse.   You will tire more easily in the third trimester, which is normal.   The baby's movements may be stronger and more often.   You may become short of breath easily.   Your belly button may stick out.   A yellow discharge may leak from your breasts called colostrum.   You may have a bloody mucus discharge. This usually occurs a few days to a week before labor begins.  HOME CARE INSTRUCTIONS   Keep your caregiver's appointments. Follow your caregiver's instructions regarding medication use, exercise, and diet.   During pregnancy, you are providing food for you and your baby. Continue to eat regular, well-balanced meals. Choose foods such as meat, fish, milk and other low fat dairy products, vegetables, fruits, and whole-grain breads and cereals. Your caregiver will tell you of the ideal weight gain.   A physical sexual relationship may be continued throughout pregnancy if there are no other problems such as early (premature) leaking of amniotic fluid from the membranes, vaginal bleeding, or belly (abdominal) pain.   Exercise regularly if there are no restrictions. Check with your caregiver if you are unsure of the safety of your exercises. Greater weight gain will occur in the last 2 trimesters of pregnancy. Exercising helps:   Control your weight.   Get you in shape for labor and delivery.   You lose weight after you deliver.   Rest a lot with legs elevated, or as needed for leg cramps or low back pain.   Wear a good support or jogging bra for breast tenderness during  pregnancy. This may help if worn during sleep. Pads or tissues may be used in the bra if you are leaking colostrum.   Do not use hot tubs, steam rooms, or saunas.   Wear your seat belt when driving. This protects you and your baby if you are in an accident.   Avoid raw meat, cat litter boxes and soil used by cats. These carry germs that can cause birth defects in the baby.   It is easier to loose urine during pregnancy. Tightening up and strengthening the pelvic muscles will help with this problem. You can practice stopping your urination while you are going to the bathroom. These are the same muscles you need to strengthen. It is also the muscles you would use if you were trying to stop from passing gas. You can practice tightening these muscles up 10 times a set and repeating this about 3 times per day. Once you know what muscles to tighten up, do not perform these exercises during urination. It is more likely   to cause an infection by backing up the urine.   Ask for help if you have financial, counseling or nutritional needs during pregnancy. Your caregiver will be able to offer counseling for these needs as well as refer you for other special needs.   Make a list of emergency phone numbers and have them available.   Plan on getting help from family or friends when you go home from the hospital.   Make a trial run to the hospital.   Take prenatal classes with the father to understand, practice and ask questions about the labor and delivery.   Prepare the baby's room/nursery.   Do not travel out of the city unless it is absolutely necessary and with the advice of your caregiver.   Wear only low or no heal shoes to have better balance and prevent falling.  MEDICATIONS AND DRUG USE IN PREGNANCY  Take prenatal vitamins as directed. The vitamin should contain 1 milligram of folic acid. Keep all vitamins out of reach of children. Only a couple vitamins or tablets containing iron may be fatal  to a baby or young child when ingested.   Avoid use of all medications, including herbs, over-the-counter medications, not prescribed or suggested by your caregiver. Only take over-the-counter or prescription medicines for pain, discomfort, or fever as directed by your caregiver. Do not use aspirin, ibuprofen (Motrin, Advil, Nuprin) or naproxen (Aleve) unless OK'd by your caregiver.   Let your caregiver also know about herbs you may be using.   Alcohol is related to a number of birth defects. This includes fetal alcohol syndrome. All alcohol, in any form, should be avoided completely. Smoking will cause low birth rate and premature babies.   Street/illegal drugs are very harmful to the baby. They are absolutely forbidden. A baby born to an addicted mother will be addicted at birth. The baby will go through the same withdrawal an adult does.  SEEK MEDICAL CARE IF: You have any concerns or worries during your pregnancy. It is better to call with your questions if you feel they cannot wait, rather than worry about them. DECISIONS ABOUT CIRCUMCISION You may or may not know the sex of your baby. If you know your baby is a boy, it may be time to think about circumcision. Circumcision is the removal of the foreskin of the penis. This is the skin that covers the sensitive end of the penis. There is no proven medical need for this. Often this decision is made on what is popular at the time or based upon religious beliefs and social issues. You can discuss these issues with your caregiver or pediatrician. SEEK IMMEDIATE MEDICAL CARE IF:   An unexplained oral temperature above 102 F (38.9 C) develops, or as your caregiver suggests.   You have leaking of fluid from the vagina (birth canal). If leaking membranes are suspected, take your temperature and tell your caregiver of this when you call.   There is vaginal spotting, bleeding or passing clots. Tell your caregiver of the amount and how many pads are  used.   You develop a bad smelling vaginal discharge with a change in the color from clear to white.   You develop vomiting that lasts more than 24 hours.   You develop chills or fever.   You develop shortness of breath.   You develop burning on urination.   You loose more than 2 pounds of weight or gain more than 2 pounds of weight or as suggested by your   caregiver.   You notice sudden swelling of your face, hands, and feet or legs.   You develop belly (abdominal) pain. Round ligament discomfort is a common non-cancerous (benign) cause of abdominal pain in pregnancy. Your caregiver still must evaluate you.   You develop a severe headache that does not go away.   You develop visual problems, blurred or double vision.   If you have not felt your baby move for more than 1 hour. If you think the baby is not moving as much as usual, eat something with sugar in it and lie down on your left side for an hour. The baby should move at least 4 to 5 times per hour. Call right away if your baby moves less than that.   You fall, are in a car accident or any kind of trauma.   There is mental or physical violence at home.  Document Released: 01/01/2001 Document Revised: 09/19/2010 Document Reviewed: 07/06/2008 [employer] Patient Information 2012 [employer]. 

## 2010-12-29 LAB — CBC
HCT: 36.1 % (ref 36.0–46.0)
Hemoglobin: 11.6 g/dL — ABNORMAL LOW (ref 12.0–15.0)
MCH: 27.2 pg (ref 26.0–34.0)
MCHC: 32.1 g/dL (ref 30.0–36.0)
MCV: 84.7 fL (ref 78.0–100.0)
RBC: 4.26 MIL/uL (ref 3.87–5.11)

## 2011-01-22 NOTE — L&D Delivery Note (Signed)
Delivery Note Pushed well and progressed to SVD at 4:38 PM a viable and healthy female was delivered via Vaginal, Spontaneous Delivery (Presentation: Right Occiput Posterior).  APGAR: 9, 9; weight 8 lb 14.3 oz (4035 g).   No difficulty with shoulders.  Placenta status: Intact, Spontaneous.  Cord: 3 vessels Placenta given to Cord Blood [facility] personnel  There was a midline 2nd degree perineal laceration which was repaired in the usual fashion.   Anesthesia: Epidural  Episiotomy: None Lacerations: 2nd degree Suture Repair: 3.0 Est. Blood Loss (mL): 200  Mom to postpartum.  Baby to nursery-stable.  Wynelle Bourgeois 03/13/2011, 6:29 PM

## 2011-01-25 ENCOUNTER — Ambulatory Visit (INDEPENDENT_AMBULATORY_CARE_PROVIDER_SITE_OTHER): Payer: Managed Care, Other (non HMO) | Admitting: Advanced Practice Midwife

## 2011-01-25 DIAGNOSIS — Q999 Chromosomal abnormality, unspecified: Secondary | ICD-10-CM

## 2011-01-25 DIAGNOSIS — IMO0002 Reserved for concepts with insufficient information to code with codable children: Secondary | ICD-10-CM

## 2011-01-25 DIAGNOSIS — A6000 Herpesviral infection of urogenital system, unspecified: Secondary | ICD-10-CM

## 2011-01-25 DIAGNOSIS — R760 Raised antibody titer: Secondary | ICD-10-CM

## 2011-01-25 DIAGNOSIS — O09299 Supervision of pregnancy with other poor reproductive or obstetric history, unspecified trimester: Secondary | ICD-10-CM

## 2011-01-25 DIAGNOSIS — A6009 Herpesviral infection of other urogenital tract: Secondary | ICD-10-CM

## 2011-01-25 DIAGNOSIS — O09219 Supervision of pregnancy with history of pre-term labor, unspecified trimester: Secondary | ICD-10-CM

## 2011-01-25 DIAGNOSIS — R894 Abnormal immunological findings in specimens from other organs, systems and tissues: Secondary | ICD-10-CM

## 2011-01-25 NOTE — Progress Notes (Signed)
p=90 

## 2011-01-29 DIAGNOSIS — O09219 Supervision of pregnancy with history of pre-term labor, unspecified trimester: Secondary | ICD-10-CM | POA: Insufficient documentation

## 2011-01-29 NOTE — Progress Notes (Signed)
Concerned about pressure to have C/S w/ G4 due to measuring S>D (baby was 8-8, uncomplicated SVD). No wt gain since transfer of care. Reports good nutrition and appetite. Monitoring CBGs informally--normal per pt. GBSc at [place]. Considering water birth. Valtrex Rx.

## 2011-01-30 MED ORDER — VALACYCLOVIR HCL 500 MG PO TABS: 500.0000 mg | ORAL_TABLET | Freq: Every day | ORAL | Status: DC

## 2011-02-15 ENCOUNTER — Ambulatory Visit (INDEPENDENT_AMBULATORY_CARE_PROVIDER_SITE_OTHER): Payer: Managed Care, Other (non HMO) | Admitting: Family

## 2011-02-15 VITALS — BP 119/75 | Temp 98.0°F | Wt 256.0 lb

## 2011-02-15 DIAGNOSIS — Z3480 Encounter for supervision of other normal pregnancy, unspecified trimester: Secondary | ICD-10-CM

## 2011-02-15 NOTE — Progress Notes (Signed)
Reports right flank pain; denies fever, body aches or chills; pt thinks "start of kidney stone"; encouraged increasing fluids and to go to [facility] for increased pain; pt reports concern that baby is too small>order growth ultrasound; reviewed signs of labor

## 2011-02-15 NOTE — Progress Notes (Signed)
p-93  RT side and back pain.  Pt afraid it is kidney stone or colon issues.    No blood in urine or on chem strip.

## 2011-02-19 ENCOUNTER — Ambulatory Visit (HOSPITAL_COMMUNITY)
Admission: RE | Admit: 2011-02-19 | Discharge: 2011-02-19 | Disposition: A | Discharge status: Home / Self Care | Payer: Managed Care, Other (non HMO) | Source: Ambulatory Visit | Attending: Family | Admitting: Family

## 2011-02-19 ENCOUNTER — Ambulatory Visit (HOSPITAL_COMMUNITY): Payer: Managed Care, Other (non HMO)

## 2011-02-19 DIAGNOSIS — Z3480 Encounter for supervision of other normal pregnancy, unspecified trimester: Secondary | ICD-10-CM

## 2011-02-19 DIAGNOSIS — O2620 Pregnancy care for patient with recurrent pregnancy loss, unspecified trimester: Secondary | ICD-10-CM | POA: Insufficient documentation

## 2011-02-19 DIAGNOSIS — O09299 Supervision of pregnancy with other poor reproductive or obstetric history, unspecified trimester: Secondary | ICD-10-CM | POA: Insufficient documentation

## 2011-02-19 DIAGNOSIS — O368190 Decreased fetal movements, unspecified trimester, not applicable or unspecified: Secondary | ICD-10-CM | POA: Insufficient documentation

## 2011-02-19 DIAGNOSIS — Z8751 Personal history of pre-term labor: Secondary | ICD-10-CM | POA: Insufficient documentation

## 2011-02-22 ENCOUNTER — Ambulatory Visit (INDEPENDENT_AMBULATORY_CARE_PROVIDER_SITE_OTHER): Payer: Managed Care, Other (non HMO) | Admitting: Advanced Practice Midwife

## 2011-02-22 ENCOUNTER — Encounter: Payer: Self-pay | Admitting: Family

## 2011-02-22 VITALS — BP 119/76 | Temp 97.0°F | Wt 256.0 lb

## 2011-02-22 DIAGNOSIS — IMO0002 Reserved for concepts with insufficient information to code with codable children: Secondary | ICD-10-CM | POA: Insufficient documentation

## 2011-02-22 DIAGNOSIS — O0940 Supervision of pregnancy with grand multiparity, unspecified trimester: Secondary | ICD-10-CM

## 2011-02-22 NOTE — Progress Notes (Signed)
[place] yesterday 7-14 >90%, AFI 21, 84%, vtx. Active baby. Reviewed neg GBS.

## 2011-02-22 NOTE — Patient Instructions (Signed)
Pregnancy - Third Trimester The third trimester of pregnancy (the last 3 months) is a period of the most rapid growth for you and your baby. The baby approaches a length of 20 inches and a weight of 6 to 10 pounds. The baby is adding on fat and getting ready for life outside your body. While inside, babies have periods of sleeping and waking, suck their thumbs, and hiccups. You can often feel small contractions of the uterus. This is false labor. It is also called Braxton-Hicks contractions. This is like a practice for labor. The usual problems in this stage of pregnancy include more difficulty breathing, swelling of the hands and feet from water retention, and having to urinate more often because of the uterus and baby pressing on your bladder.  PRENATAL EXAMS  Blood work may continue to be done during prenatal exams. These tests are done to check on your health and the probable health of your baby. Blood work is used to follow your blood levels (hemoglobin). Anemia (low hemoglobin) is common during pregnancy. Iron and vitamins are given to help prevent this. You may also continue to be checked for diabetes. Some of the past blood tests may be done again.   The size of the uterus is measured during each visit. This makes sure your baby is growing properly according to your pregnancy dates.   Your blood pressure is checked every prenatal visit. This is to make sure you are not getting toxemia.   Your urine is checked every prenatal visit for infection, diabetes and protein.   Your weight is checked at each visit. This is done to make sure gains are happening at the suggested rate and that you and your baby are growing normally.   Sometimes, an ultrasound is performed to confirm the position and the proper growth and development of the baby. This is a test done that bounces harmless sound waves off the baby so your caregiver can more accurately determine due dates.   Discuss the type of pain  medication and anesthesia you will have during your labor and delivery.   Discuss the possibility and anesthesia if a Cesarean Section might be necessary.   Inform your caregiver if there is any mental or physical violence at home.  Sometimes, a specialized non-stress test, contraction stress test and biophysical profile are done to make sure the baby is not having a problem. Checking the amniotic fluid surrounding the baby is called an amniocentesis. The amniotic fluid is removed by sticking a needle into the belly (abdomen). This is sometimes done near the end of pregnancy if an early delivery is required. In this case, it is done to help make sure the baby's lungs are mature enough for the baby to live outside of the womb. If the lungs are not mature and it is unsafe to deliver the baby, an injection of cortisone medication is given to the mother 1 to 2 days before the delivery. This helps the baby's lungs mature and makes it safer to deliver the baby. CHANGES OCCURING IN THE THIRD TRIMESTER OF PREGNANCY Your body goes through many changes during pregnancy. They vary from person to person. Talk to your caregiver about changes you notice and are concerned about.  During the last trimester, you have probably had an increase in your appetite. It is normal to have cravings for certain foods. This varies from person to person and pregnancy to pregnancy.   You may begin to get stretch marks on your hips,   abdomen, and breasts. These are normal changes in the body during pregnancy. There are no exercises or medications to take which prevent this change.   Constipation may be treated with a stool softener or adding bulk to your diet. Drinking lots of fluids, fiber in vegetables, fruits, and whole grains are helpful.   Exercising is also helpful. If you have been very active up until your pregnancy, most of these activities can be continued during your pregnancy. If you have been less active, it is helpful  to start an exercise program such as walking. Consult your caregiver before starting exercise programs.   Avoid all smoking, alcohol, un-prescribed drugs, herbs and "street drugs" during your pregnancy. These chemicals affect the formation and growth of the baby. Avoid chemicals throughout the pregnancy to ensure the delivery of a healthy infant.   Backache, varicose veins and hemorrhoids may develop or get worse.   You will tire more easily in the third trimester, which is normal.   The baby's movements may be stronger and more often.   You may become short of breath easily.   Your belly button may stick out.   A yellow discharge may leak from your breasts called colostrum.   You may have a bloody mucus discharge. This usually occurs a few days to a week before labor begins.  HOME CARE INSTRUCTIONS   Keep your caregiver's appointments. Follow your caregiver's instructions regarding medication use, exercise, and diet.   During pregnancy, you are providing food for you and your baby. Continue to eat regular, well-balanced meals. Choose foods such as meat, fish, milk and other low fat dairy products, vegetables, fruits, and whole-grain breads and cereals. Your caregiver will tell you of the ideal weight gain.   A physical sexual relationship may be continued throughout pregnancy if there are no other problems such as early (premature) leaking of amniotic fluid from the membranes, vaginal bleeding, or belly (abdominal) pain.   Exercise regularly if there are no restrictions. Check with your caregiver if you are unsure of the safety of your exercises. Greater weight gain will occur in the last 2 trimesters of pregnancy. Exercising helps:   Control your weight.   Get you in shape for labor and delivery.   You lose weight after you deliver.   Rest a lot with legs elevated, or as needed for leg cramps or low back pain.   Wear a good support or jogging bra for breast tenderness during  pregnancy. This may help if worn during sleep. Pads or tissues may be used in the bra if you are leaking colostrum.   Do not use hot tubs, steam rooms, or saunas.   Wear your seat belt when driving. This protects you and your baby if you are in an accident.   Avoid raw meat, cat litter boxes and soil used by cats. These carry germs that can cause birth defects in the baby.   It is easier to loose urine during pregnancy. Tightening up and strengthening the pelvic muscles will help with this problem. You can practice stopping your urination while you are going to the bathroom. These are the same muscles you need to strengthen. It is also the muscles you would use if you were trying to stop from passing gas. You can practice tightening these muscles up 10 times a set and repeating this about 3 times per day. Once you know what muscles to tighten up, do not perform these exercises during urination. It is more likely   to cause an infection by backing up the urine.   Ask for help if you have financial, counseling or nutritional needs during pregnancy. Your caregiver will be able to offer counseling for these needs as well as refer you for other special needs.   Make a list of emergency phone numbers and have them available.   Plan on getting help from family or friends when you go home from the hospital.   Make a trial run to the hospital.   Take prenatal classes with the father to understand, practice and ask questions about the labor and delivery.   Prepare the baby's room/nursery.   Do not travel out of the city unless it is absolutely necessary and with the advice of your caregiver.   Wear only low or no heal shoes to have better balance and prevent falling.  MEDICATIONS AND DRUG USE IN PREGNANCY  Take prenatal vitamins as directed. The vitamin should contain 1 milligram of folic acid. Keep all vitamins out of reach of children. Only a couple vitamins or tablets containing iron may be fatal  to a baby or young child when ingested.   Avoid use of all medications, including herbs, over-the-counter medications, not prescribed or suggested by your caregiver. Only take over-the-counter or prescription medicines for pain, discomfort, or fever as directed by your caregiver. Do not use aspirin, ibuprofen (Motrin, Advil, Nuprin) or naproxen (Aleve) unless OK'd by your caregiver.   Let your caregiver also know about herbs you may be using.   Alcohol is related to a number of birth defects. This includes fetal alcohol syndrome. All alcohol, in any form, should be avoided completely. Smoking will cause low birth rate and premature babies.   Street/illegal drugs are very harmful to the baby. They are absolutely forbidden. A baby born to an addicted mother will be addicted at birth. The baby will go through the same withdrawal an adult does.  SEEK MEDICAL CARE IF: You have any concerns or worries during your pregnancy. It is better to call with your questions if you feel they cannot wait, rather than worry about them. DECISIONS ABOUT CIRCUMCISION You may or may not know the sex of your baby. If you know your baby is a boy, it may be time to think about circumcision. Circumcision is the removal of the foreskin of the penis. This is the skin that covers the sensitive end of the penis. There is no proven medical need for this. Often this decision is made on what is popular at the time or based upon religious beliefs and social issues. You can discuss these issues with your caregiver or pediatrician. SEEK IMMEDIATE MEDICAL CARE IF:   An unexplained oral temperature above 102 F (38.9 C) develops, or as your caregiver suggests.   You have leaking of fluid from the vagina (birth canal). If leaking membranes are suspected, take your temperature and tell your caregiver of this when you call.   There is vaginal spotting, bleeding or passing clots. Tell your caregiver of the amount and how many pads are  used.   You develop a bad smelling vaginal discharge with a change in the color from clear to white.   You develop vomiting that lasts more than 24 hours.   You develop chills or fever.   You develop shortness of breath.   You develop burning on urination.   You loose more than 2 pounds of weight or gain more than 2 pounds of weight or as suggested by your   caregiver.   You notice sudden swelling of your face, hands, and feet or legs.   You develop belly (abdominal) pain. Round ligament discomfort is a common non-cancerous (benign) cause of abdominal pain in pregnancy. Your caregiver still must evaluate you.   You develop a severe headache that does not go away.   You develop visual problems, blurred or double vision.   If you have not felt your baby move for more than 1 hour. If you think the baby is not moving as much as usual, eat something with sugar in it and lie down on your left side for an hour. The baby should move at least 4 to 5 times per hour. Call right away if your baby moves less than that.   You fall, are in a car accident or any kind of trauma.   There is mental or physical violence at home.  Document Released: 01/01/2001 Document Revised: 09/19/2010 Document Reviewed: 07/06/2008 [employer] Patient Information 2012 [employer]. 

## 2011-02-22 NOTE — Progress Notes (Signed)
p-87  Pt reports that Rt flank pain is gone @ the present.

## 2011-03-01 ENCOUNTER — Ambulatory Visit (INDEPENDENT_AMBULATORY_CARE_PROVIDER_SITE_OTHER): Payer: Managed Care, Other (non HMO) | Admitting: Family

## 2011-03-01 DIAGNOSIS — IMO0002 Reserved for concepts with insufficient information to code with codable children: Secondary | ICD-10-CM

## 2011-03-01 DIAGNOSIS — O3660X0 Maternal care for excessive fetal growth, unspecified trimester, not applicable or unspecified: Secondary | ICD-10-CM

## 2011-03-01 DIAGNOSIS — Z3480 Encounter for supervision of other normal pregnancy, unspecified trimester: Secondary | ICD-10-CM

## 2011-03-01 NOTE — Progress Notes (Signed)
No questions or concerns; pt verbalized that Dr. Sherrie George informed her that spontaneous labor would be fine;  Declined cervical check.  Schedule follow-up ultrasound for growth.

## 2011-03-01 NOTE — Progress Notes (Signed)
p-97 

## 2011-03-05 ENCOUNTER — Telehealth: Payer: Self-pay | Admitting: *Deleted

## 2011-03-05 DIAGNOSIS — J329 Chronic sinusitis, unspecified: Secondary | ICD-10-CM

## 2011-03-05 MED ORDER — AZITHROMYCIN 250 MG PO TABS: ORAL_TABLET | ORAL | Status: AC

## 2011-03-05 NOTE — Telephone Encounter (Signed)
Pt called with c/o sinus infection.  She has been taking Sudafed x 5 days she now has green mucous and pain in her LT cheek.  She denies any fever but also congestion.  Per Dr Macon Large ZPak ordered which was escribed to [facility].

## 2011-03-06 ENCOUNTER — Ambulatory Visit (HOSPITAL_COMMUNITY)
Admission: RE | Admit: 2011-03-06 | Discharge: 2011-03-06 | Disposition: A | Discharge status: Home / Self Care | Payer: Managed Care, Other (non HMO) | Source: Ambulatory Visit | Attending: Family | Admitting: Family

## 2011-03-06 DIAGNOSIS — IMO0002 Reserved for concepts with insufficient information to code with codable children: Secondary | ICD-10-CM

## 2011-03-06 DIAGNOSIS — O2620 Pregnancy care for patient with recurrent pregnancy loss, unspecified trimester: Secondary | ICD-10-CM | POA: Insufficient documentation

## 2011-03-06 DIAGNOSIS — O3660X0 Maternal care for excessive fetal growth, unspecified trimester, not applicable or unspecified: Secondary | ICD-10-CM | POA: Insufficient documentation

## 2011-03-07 ENCOUNTER — Emergency Department
Admission: EM | Admit: 2011-03-07 | Discharge: 2011-03-07 | Disposition: A | Discharge status: Home / Self Care | Payer: Managed Care, Other (non HMO) | Source: Home / Self Care | Attending: Family Medicine | Admitting: Family Medicine

## 2011-03-07 ENCOUNTER — Encounter: Payer: Self-pay | Admitting: *Deleted

## 2011-03-07 DIAGNOSIS — J0100 Acute maxillary sinusitis, unspecified: Secondary | ICD-10-CM

## 2011-03-07 DIAGNOSIS — J321 Chronic frontal sinusitis: Secondary | ICD-10-CM

## 2011-03-07 MED ORDER — AMOXICILLIN 875 MG PO TABS: 875.0000 mg | ORAL_TABLET | Freq: Two times a day (BID) | ORAL | Status: DC

## 2011-03-07 NOTE — ED Notes (Signed)
Patient c/o "cold like" symptoms x 10 days. A couple days ago she developed facial, sinus and teeth pain that his moving to her head and neck. Also c/o ear pain with little drainage. She is on day 3 of a z-pak that was called in by her GYN. She has also taken sudafed and mucinex. She is [redacted] weeks gestation. She has an apt with her GYN tomorrow.

## 2011-03-07 NOTE — ED Provider Notes (Signed)
History     CSN: [id number]  Arrival date & time 03/07/11  1814   First MD Initiated Contact with Patient 03/07/11 1902      Chief Complaint  Patient presents with  . Facial Pain  . Sinusitis      HPI Comments: Patient complains of approximately 10 day history of gradually progressive URI symptoms beginning with a mild sore throat (now improved), followed by progressive nasal congestion.  A cough started next.  Complains of fatigue and initial myalgias.  Cough is now worse at night and generally non-productive during the day.  There has been no pleuritic pain, shortness of breath, or wheezes.  She complains or 4 day history of left earache and left sinus pressure.  She is [redacted] weeks pregnant.  Her OB started her on a Z-pack but she has had no improvement.  The history is provided by the patient.    Past Medical History  Diagnosis Date  . Miscarriage     x 8  . ADHD (attention deficit hyperactivity disorder)   . Chromosomal abnormality   . HSV (herpes simplex virus) anogenital infection     Past Surgical History  Procedure Date  . Dilation and curettage of uterus 1998  . Arm surgery for fracture   . Knee surgery     Family History  Problem Relation Age of Onset  . Autism spectrum disorder Daughter   . Other Daughter     Pancreatic insufficiency.     History  Substance Use Topics  . Smoking status: Former Games developer  . Smokeless tobacco: Not on file  . Alcohol Use: No    OB History    Grav Para Term Preterm Abortions TAB SAB Ect Mult Living   15 6 5 1 8 1 7   5       Review of Systems + sore throat + cough No pleuritic pain No wheezing + nasal congestion + post-nasal drainage +  sinus pain/pressure No itchy/red eyes + left earache No hemoptysis No SOB + fever/chills No nausea No vomiting No abdominal pain No diarrhea No urinary symptoms No skin rashes + fatigue No myalgias + headache Used OTC meds without relief  Allergies  Review of patient's  allergies indicates no known allergies.  Home Medications   Current Outpatient Rx  Name Route Sig Dispense Refill  . AMOXICILLIN 875 MG PO TABS Oral Take 1 tablet (875 mg total) by mouth 2 (two) times daily. 20 tablet 0  . AZITHROMYCIN 250 MG PO TABS  Take 2 tablets (500 mg) on  Day 1,  followed by 1 tablet (250 mg) once daily on Days 2 through 5. 6 each 0  . BUTALBITAL-APAP-CAFFEINE 50-325-40 MG PO TABS Oral Take 1 tablet by mouth as needed.      . OMEGA-3 FATTY ACIDS 1000 MG PO CAPS Oral Take 2 g by mouth daily.      Marland Kitchen PRENATA PO Oral Take by mouth.      Marland Kitchen VALACYCLOVIR HCL 500 MG PO TABS Oral Take 1 tablet (500 mg total) by mouth daily. 30 tablet 6    BP 122/86  Pulse 96  Temp(Src) 99 F (37.2 C) (Oral)  Resp 14  Wt 260 lb (117.935 kg)  SpO2 97%  LMP 06/06/2010  Physical Exam Nursing notes and Vital Signs reviewed. Appearance:  Patient appears healthy, stated age, and in no acute distress Eyes:  Pupils are equal, round, and reactive to light and accomodation.  Extraocular movement is intact.  Conjunctivae are  not inflamed  Ears:  Canals normal.  Tympanic membranes normal.  Nose:  Moderately congested turbinates.  Frontal and bilateral maxillary sinus tenderness is present (right more tender than left) Pharynx:  Normal Neck:  Supple.  Slightly tender shotty posterior nodes are palpated bilaterally  Lungs:  Clear to auscultation.  Breath sounds are equal.  Heart:  Regular rate and rhythm without murmurs, rubs, or gallops.  Abdomen:  Gravid, nontender without masses or hepatosplenomegaly.  Bowel sounds are present.  No CVA or flank tenderness.  Extremities:  No edema.  No calf tenderness Skin:  No rash present.   ED Course  Procedures none   US Ob Follow Up  03/06/2011  OBSTETRICAL ULTRASOUND: This exam was performed within a [facility]. The OB US report was generated in the [facility] system, and faxed to the ordering physician.   This report is also available  in [employer] and in the [employer]. See [facility] Obstetric US report.   Tympanogram:  Left ear high peak height; right ear normal  1. Acute maxillary sinusitis   2. Frontal sinusitis       MDM    Take plain Mucinex (guaifenesin) twice daily for cough and congestion.  Continue Sudafed and increased fluid intake. May use Afrin nasal spray (or generic oxymetazoline) twice daily for about 5 days.  Also recommend using saline nasal spray several times daily and saline nasal irrigation (AYR is a common brand) Stop azithromycin and switch to amoxicillin Followup with OB as scheduled       Donna Christen, MD 03/09/11 2017

## 2011-03-07 NOTE — Discharge Instructions (Signed)
Take plain Mucinex (guaifenesin) twice daily for cough and congestion.  Continue Sudafed and increased fluid intake. May use Afrin nasal spray (or generic oxymetazoline) twice daily for about 5 days.  Also recommend using saline nasal spray several times daily and saline nasal irrigation (AYR is a common brand) Stop azithromycin and switch to amoxicillin

## 2011-03-08 ENCOUNTER — Ambulatory Visit (INDEPENDENT_AMBULATORY_CARE_PROVIDER_SITE_OTHER): Payer: Managed Care, Other (non HMO) | Admitting: Advanced Practice Midwife

## 2011-03-08 VITALS — BP 125/75 | Temp 96.1°F | Wt 256.0 lb

## 2011-03-08 DIAGNOSIS — O3660X0 Maternal care for excessive fetal growth, unspecified trimester, not applicable or unspecified: Secondary | ICD-10-CM

## 2011-03-08 DIAGNOSIS — IMO0002 Reserved for concepts with insufficient information to code with codable children: Secondary | ICD-10-CM

## 2011-03-08 DIAGNOSIS — O0940 Supervision of pregnancy with grand multiparity, unspecified trimester: Secondary | ICD-10-CM

## 2011-03-08 MED ORDER — FLUTICASONE PROPIONATE 50 MCG/ACT NA SUSP: 2.0000 | Freq: Every day | NASAL | Status: DC

## 2011-03-08 NOTE — Progress Notes (Signed)
Has sinus infection - taking Sudafed, Mucinex, Tylenol, Zpak, saline nasal rinses, still feeling very congested. Will send rx for Flonase. U/S EFW on 2/13: 4556 gm, AC>97%ile. Pt does not feel like baby is 10 lb, 8-9 lbs by leopolds. SVE: 3/50/-2.

## 2011-03-08 NOTE — Progress Notes (Signed)
p-93  Seen in urgent care yesterday for sinus infection.  Dr would like her to be on steroids for sinus infection but wanted to check with OB/GYN first

## 2011-03-08 NOTE — Patient Instructions (Signed)
Pregnancy - Third Trimester The third trimester of pregnancy (the last 3 months) is a period of the most rapid growth for you and your baby. The baby approaches a length of 20 inches and a weight of 6 to 10 pounds. The baby is adding on fat and getting ready for life outside your body. While inside, babies have periods of sleeping and waking, suck their thumbs, and hiccups. You can often feel small contractions of the uterus. This is false labor. It is also called Braxton-Hicks contractions. This is like a practice for labor. The usual problems in this stage of pregnancy include more difficulty breathing, swelling of the hands and feet from water retention, and having to urinate more often because of the uterus and baby pressing on your bladder.  PRENATAL EXAMS  Blood work may continue to be done during prenatal exams. These tests are done to check on your health and the probable health of your baby. Blood work is used to follow your blood levels (hemoglobin). Anemia (low hemoglobin) is common during pregnancy. Iron and vitamins are given to help prevent this. You may also continue to be checked for diabetes. Some of the past blood tests may be done again.   The size of the uterus is measured during each visit. This makes sure your baby is growing properly according to your pregnancy dates.   Your blood pressure is checked every prenatal visit. This is to make sure you are not getting toxemia.   Your urine is checked every prenatal visit for infection, diabetes and protein.   Your weight is checked at each visit. This is done to make sure gains are happening at the suggested rate and that you and your baby are growing normally.   Sometimes, an ultrasound is performed to confirm the position and the proper growth and development of the baby. This is a test done that bounces harmless sound waves off the baby so your caregiver can more accurately determine due dates.   Discuss the type of pain  medication and anesthesia you will have during your labor and delivery.   Discuss the possibility and anesthesia if a Cesarean Section might be necessary.   Inform your caregiver if there is any mental or physical violence at home.  Sometimes, a specialized non-stress test, contraction stress test and biophysical profile are done to make sure the baby is not having a problem. Checking the amniotic fluid surrounding the baby is called an amniocentesis. The amniotic fluid is removed by sticking a needle into the belly (abdomen). This is sometimes done near the end of pregnancy if an early delivery is required. In this case, it is done to help make sure the baby's lungs are mature enough for the baby to live outside of the womb. If the lungs are not mature and it is unsafe to deliver the baby, an injection of cortisone medication is given to the mother 1 to 2 days before the delivery. This helps the baby's lungs mature and makes it safer to deliver the baby. CHANGES OCCURING IN THE THIRD TRIMESTER OF PREGNANCY Your body goes through many changes during pregnancy. They vary from person to person. Talk to your caregiver about changes you notice and are concerned about.  During the last trimester, you have probably had an increase in your appetite. It is normal to have cravings for certain foods. This varies from person to person and pregnancy to pregnancy.   You may begin to get stretch marks on your hips,   abdomen, and breasts. These are normal changes in the body during pregnancy. There are no exercises or medications to take which prevent this change.   Constipation may be treated with a stool softener or adding bulk to your diet. Drinking lots of fluids, fiber in vegetables, fruits, and whole grains are helpful.   Exercising is also helpful. If you have been very active up until your pregnancy, most of these activities can be continued during your pregnancy. If you have been less active, it is helpful  to start an exercise program such as walking. Consult your caregiver before starting exercise programs.   Avoid all smoking, alcohol, un-prescribed drugs, herbs and "street drugs" during your pregnancy. These chemicals affect the formation and growth of the baby. Avoid chemicals throughout the pregnancy to ensure the delivery of a healthy infant.   Backache, varicose veins and hemorrhoids may develop or get worse.   You will tire more easily in the third trimester, which is normal.   The baby's movements may be stronger and more often.   You may become short of breath easily.   Your belly button may stick out.   A yellow discharge may leak from your breasts called colostrum.   You may have a bloody mucus discharge. This usually occurs a few days to a week before labor begins.  HOME CARE INSTRUCTIONS   Keep your caregiver's appointments. Follow your caregiver's instructions regarding medication use, exercise, and diet.   During pregnancy, you are providing food for you and your baby. Continue to eat regular, well-balanced meals. Choose foods such as meat, fish, milk and other low fat dairy products, vegetables, fruits, and whole-grain breads and cereals. Your caregiver will tell you of the ideal weight gain.   A physical sexual relationship may be continued throughout pregnancy if there are no other problems such as early (premature) leaking of amniotic fluid from the membranes, vaginal bleeding, or belly (abdominal) pain.   Exercise regularly if there are no restrictions. Check with your caregiver if you are unsure of the safety of your exercises. Greater weight gain will occur in the last 2 trimesters of pregnancy. Exercising helps:   Control your weight.   Get you in shape for labor and delivery.   You lose weight after you deliver.   Rest a lot with legs elevated, or as needed for leg cramps or low back pain.   Wear a good support or jogging bra for breast tenderness during  pregnancy. This may help if worn during sleep. Pads or tissues may be used in the bra if you are leaking colostrum.   Do not use hot tubs, steam rooms, or saunas.   Wear your seat belt when driving. This protects you and your baby if you are in an accident.   Avoid raw meat, cat litter boxes and soil used by cats. These carry germs that can cause birth defects in the baby.   It is easier to loose urine during pregnancy. Tightening up and strengthening the pelvic muscles will help with this problem. You can practice stopping your urination while you are going to the bathroom. These are the same muscles you need to strengthen. It is also the muscles you would use if you were trying to stop from passing gas. You can practice tightening these muscles up 10 times a set and repeating this about 3 times per day. Once you know what muscles to tighten up, do not perform these exercises during urination. It is more likely   to cause an infection by backing up the urine.   Ask for help if you have financial, counseling or nutritional needs during pregnancy. Your caregiver will be able to offer counseling for these needs as well as refer you for other special needs.   Make a list of emergency phone numbers and have them available.   Plan on getting help from family or friends when you go home from the hospital.   Make a trial run to the hospital.   Take prenatal classes with the father to understand, practice and ask questions about the labor and delivery.   Prepare the baby's room/nursery.   Do not travel out of the city unless it is absolutely necessary and with the advice of your caregiver.   Wear only low or no heal shoes to have better balance and prevent falling.  MEDICATIONS AND DRUG USE IN PREGNANCY  Take prenatal vitamins as directed. The vitamin should contain 1 milligram of folic acid. Keep all vitamins out of reach of children. Only a couple vitamins or tablets containing iron may be fatal  to a baby or young child when ingested.   Avoid use of all medications, including herbs, over-the-counter medications, not prescribed or suggested by your caregiver. Only take over-the-counter or prescription medicines for pain, discomfort, or fever as directed by your caregiver. Do not use aspirin, ibuprofen (Motrin, Advil, Nuprin) or naproxen (Aleve) unless OK'd by your caregiver.   Let your caregiver also know about herbs you may be using.   Alcohol is related to a number of birth defects. This includes fetal alcohol syndrome. All alcohol, in any form, should be avoided completely. Smoking will cause low birth rate and premature babies.   Street/illegal drugs are very harmful to the baby. They are absolutely forbidden. A baby born to an addicted mother will be addicted at birth. The baby will go through the same withdrawal an adult does.  SEEK MEDICAL CARE IF: You have any concerns or worries during your pregnancy. It is better to call with your questions if you feel they cannot wait, rather than worry about them. DECISIONS ABOUT CIRCUMCISION You may or may not know the sex of your baby. If you know your baby is a boy, it may be time to think about circumcision. Circumcision is the removal of the foreskin of the penis. This is the skin that covers the sensitive end of the penis. There is no proven medical need for this. Often this decision is made on what is popular at the time or based upon religious beliefs and social issues. You can discuss these issues with your caregiver or pediatrician. SEEK IMMEDIATE MEDICAL CARE IF:   An unexplained oral temperature above 102 F (38.9 C) develops, or as your caregiver suggests.   You have leaking of fluid from the vagina (birth canal). If leaking membranes are suspected, take your temperature and tell your caregiver of this when you call.   There is vaginal spotting, bleeding or passing clots. Tell your caregiver of the amount and how many pads are  used.   You develop a bad smelling vaginal discharge with a change in the color from clear to white.   You develop vomiting that lasts more than 24 hours.   You develop chills or fever.   You develop shortness of breath.   You develop burning on urination.   You loose more than 2 pounds of weight or gain more than 2 pounds of weight or as suggested by your   caregiver.   You notice sudden swelling of your face, hands, and feet or legs.   You develop belly (abdominal) pain. Round ligament discomfort is a common non-cancerous (benign) cause of abdominal pain in pregnancy. Your caregiver still must evaluate you.   You develop a severe headache that does not go away.   You develop visual problems, blurred or double vision.   If you have not felt your baby move for more than 1 hour. If you think the baby is not moving as much as usual, eat something with sugar in it and lie down on your left side for an hour. The baby should move at least 4 to 5 times per hour. Call right away if your baby moves less than that.   You fall, are in a car accident or any kind of trauma.   There is mental or physical violence at home.  Document Released: 01/01/2001 Document Revised: 09/19/2010 Document Reviewed: 07/06/2008 [employer] Patient Information 2012 [employer]. 

## 2011-03-13 ENCOUNTER — Encounter (HOSPITAL_COMMUNITY): Payer: Self-pay | Admitting: *Deleted

## 2011-03-13 ENCOUNTER — Inpatient Hospital Stay (HOSPITAL_COMMUNITY)
Admission: AD | Admit: 2011-03-13 | Discharge: 2011-03-15 | DRG: 775 | Disposition: A | Discharge status: Home / Self Care | Payer: Managed Care, Other (non HMO) | Source: Ambulatory Visit | Attending: Obstetrics & Gynecology | Admitting: Obstetrics & Gynecology

## 2011-03-13 ENCOUNTER — Inpatient Hospital Stay (HOSPITAL_COMMUNITY): Payer: Managed Care, Other (non HMO) | Admitting: Anesthesiology

## 2011-03-13 ENCOUNTER — Encounter (HOSPITAL_COMMUNITY): Payer: Self-pay | Admitting: Anesthesiology

## 2011-03-13 DIAGNOSIS — O3660X0 Maternal care for excessive fetal growth, unspecified trimester, not applicable or unspecified: Secondary | ICD-10-CM

## 2011-03-13 LAB — CBC
HCT: 34.5 % — ABNORMAL LOW (ref 36.0–46.0)
Hemoglobin: 11.3 g/dL — ABNORMAL LOW (ref 12.0–15.0)
MCHC: 32.8 g/dL (ref 30.0–36.0)
RBC: 4.23 MIL/uL (ref 3.87–5.11)
WBC: 12 10*3/uL — ABNORMAL HIGH (ref 4.0–10.5)

## 2011-03-13 LAB — RPR: RPR Ser Ql: NONREACTIVE

## 2011-03-13 MED ORDER — OXYTOCIN BOLUS FROM INFUSION
500.0000 mL | Freq: Once | INTRAVENOUS | Status: DC
  Filled 2011-03-13: qty 500

## 2011-03-13 MED ORDER — OXYCODONE-ACETAMINOPHEN 5-325 MG PO TABS
1.0000 | ORAL_TABLET | ORAL | Status: DC | PRN
  Administered 2011-03-13: 2 via ORAL
  Administered 2011-03-14: 1 via ORAL
  Administered 2011-03-14 – 2011-03-15 (×6): 2 via ORAL
  Filled 2011-03-13 (×3): qty 2
  Filled 2011-03-13: qty 1
  Filled 2011-03-13 (×4): qty 2

## 2011-03-13 MED ORDER — LACTATED RINGERS IV SOLN: 500.0000 mL | INTRAVENOUS | Status: DC | PRN

## 2011-03-13 MED ORDER — SIMETHICONE 80 MG PO CHEW: 80.0000 mg | CHEWABLE_TABLET | ORAL | Status: DC | PRN

## 2011-03-13 MED ORDER — ACETAMINOPHEN 325 MG PO TABS: 650.0000 mg | ORAL_TABLET | ORAL | Status: DC | PRN

## 2011-03-13 MED ORDER — LIDOCAINE HCL (PF) 1 % IJ SOLN: 30.0000 mL | INTRAMUSCULAR | Status: DC | PRN

## 2011-03-13 MED ORDER — CITRIC ACID-SODIUM CITRATE 334-500 MG/5ML PO SOLN: 30.0000 mL | ORAL | Status: DC | PRN

## 2011-03-13 MED ORDER — IBUPROFEN 600 MG PO TABS
600.0000 mg | ORAL_TABLET | Freq: Four times a day (QID) | ORAL | Status: DC | PRN
  Administered 2011-03-13: 600 mg via ORAL
  Filled 2011-03-13: qty 1

## 2011-03-13 MED ORDER — TETANUS-DIPHTH-ACELL PERTUSSIS 5-2.5-18.5 LF-MCG/0.5 IM SUSP: 0.5000 mL | Freq: Once | INTRAMUSCULAR | Status: DC

## 2011-03-13 MED ORDER — LACTATED RINGERS IV SOLN
INTRAVENOUS | Status: DC
  Administered 2011-03-13: 13:00:00 via INTRAVENOUS

## 2011-03-13 MED ORDER — EPHEDRINE 5 MG/ML INJ
10.0000 mg | INTRAVENOUS | Status: DC | PRN
  Filled 2011-03-13: qty 4

## 2011-03-13 MED ORDER — FLEET ENEMA 7-19 GM/118ML RE ENEM: 1.0000 | ENEMA | RECTAL | Status: DC | PRN

## 2011-03-13 MED ORDER — FENTANYL 2.5 MCG/ML BUPIVACAINE 1/10 % EPIDURAL INFUSION (WH - ANES)
INTRAMUSCULAR | Status: DC | PRN
  Administered 2011-03-13: 14 mL/h via EPIDURAL

## 2011-03-13 MED ORDER — OXYTOCIN 20 UNITS IN LACTATED RINGERS INFUSION - SIMPLE
125.0000 mL/h | Freq: Once | INTRAVENOUS | Status: AC
  Administered 2011-03-13: 125 mL/h via INTRAVENOUS
  Filled 2011-03-13: qty 1000

## 2011-03-13 MED ORDER — NALBUPHINE SYRINGE 5 MG/0.5 ML: 10.0000 mg | INJECTION | INTRAMUSCULAR | Status: DC | PRN

## 2011-03-13 MED ORDER — SENNOSIDES-DOCUSATE SODIUM 8.6-50 MG PO TABS
2.0000 | ORAL_TABLET | Freq: Every day | ORAL | Status: DC
  Administered 2011-03-13 – 2011-03-14 (×2): 2 via ORAL

## 2011-03-13 MED ORDER — PHENYLEPHRINE 40 MCG/ML (10ML) SYRINGE FOR IV PUSH (FOR BLOOD PRESSURE SUPPORT)
80.0000 ug | PREFILLED_SYRINGE | INTRAVENOUS | Status: DC | PRN
  Filled 2011-03-13: qty 5

## 2011-03-13 MED ORDER — WITCH HAZEL-GLYCERIN EX PADS: 1.0000 "application " | MEDICATED_PAD | CUTANEOUS | Status: DC | PRN

## 2011-03-13 MED ORDER — ZOLPIDEM TARTRATE 5 MG PO TABS: 5.0000 mg | ORAL_TABLET | Freq: Every evening | ORAL | Status: DC | PRN

## 2011-03-13 MED ORDER — OXYCODONE-ACETAMINOPHEN 5-325 MG PO TABS: 1.0000 | ORAL_TABLET | ORAL | Status: DC | PRN

## 2011-03-13 MED ORDER — PRENATAL MULTIVITAMIN CH
1.0000 | ORAL_TABLET | Freq: Every day | ORAL | Status: DC
  Administered 2011-03-13 – 2011-03-15 (×3): 1 via ORAL
  Filled 2011-03-13 (×3): qty 1

## 2011-03-13 MED ORDER — AMOXICILLIN 875 MG PO TABS: 875.0000 mg | ORAL_TABLET | Freq: Two times a day (BID) | ORAL | Status: DC

## 2011-03-13 MED ORDER — LANOLIN HYDROUS EX OINT: TOPICAL_OINTMENT | CUTANEOUS | Status: DC | PRN

## 2011-03-13 MED ORDER — DIBUCAINE 1 % RE OINT: 1.0000 "application " | TOPICAL_OINTMENT | RECTAL | Status: DC | PRN

## 2011-03-13 MED ORDER — EPHEDRINE 5 MG/ML INJ: 10.0000 mg | INTRAVENOUS | Status: DC | PRN

## 2011-03-13 MED ORDER — DIPHENHYDRAMINE HCL 50 MG/ML IJ SOLN: 12.5000 mg | INTRAMUSCULAR | Status: DC | PRN

## 2011-03-13 MED ORDER — AMOXICILLIN 500 MG PO CAPS
500.0000 mg | ORAL_CAPSULE | Freq: Two times a day (BID) | ORAL | Status: DC
  Administered 2011-03-13 – 2011-03-15 (×4): 500 mg via ORAL
  Filled 2011-03-13 (×6): qty 1

## 2011-03-13 MED ORDER — DIPHENHYDRAMINE HCL 25 MG PO CAPS: 25.0000 mg | ORAL_CAPSULE | Freq: Four times a day (QID) | ORAL | Status: DC | PRN

## 2011-03-13 MED ORDER — ONDANSETRON HCL 4 MG/2ML IJ SOLN: 4.0000 mg | Freq: Four times a day (QID) | INTRAMUSCULAR | Status: DC | PRN

## 2011-03-13 MED ORDER — ONDANSETRON HCL 4 MG PO TABS: 4.0000 mg | ORAL_TABLET | ORAL | Status: DC | PRN

## 2011-03-13 MED ORDER — SODIUM BICARBONATE 8.4 % IV SOLN
INTRAVENOUS | Status: DC | PRN
  Administered 2011-03-13: 4 mL via EPIDURAL

## 2011-03-13 MED ORDER — LACTATED RINGERS IV SOLN
500.0000 mL | Freq: Once | INTRAVENOUS | Status: AC
  Administered 2011-03-13: 1000 mL via INTRAVENOUS

## 2011-03-13 MED ORDER — ONDANSETRON HCL 4 MG/2ML IJ SOLN: 4.0000 mg | INTRAMUSCULAR | Status: DC | PRN

## 2011-03-13 MED ORDER — BENZOCAINE-MENTHOL 20-0.5 % EX AERO: 1.0000 "application " | INHALATION_SPRAY | CUTANEOUS | Status: DC | PRN

## 2011-03-13 MED ORDER — LIDOCAINE HCL (PF) 1 % IJ SOLN
INTRAMUSCULAR | Status: DC | PRN
  Administered 2011-03-13: 5 mL
  Administered 2011-03-13: 4 mL

## 2011-03-13 MED ORDER — PHENYLEPHRINE 40 MCG/ML (10ML) SYRINGE FOR IV PUSH (FOR BLOOD PRESSURE SUPPORT): 80.0000 ug | PREFILLED_SYRINGE | INTRAVENOUS | Status: DC | PRN

## 2011-03-13 MED ORDER — FENTANYL 2.5 MCG/ML BUPIVACAINE 1/10 % EPIDURAL INFUSION (WH - ANES)
14.0000 mL/h | INTRAMUSCULAR | Status: DC
  Administered 2011-03-13: 14 mL/h via EPIDURAL
  Filled 2011-03-13 (×2): qty 60

## 2011-03-13 MED ORDER — IBUPROFEN 600 MG PO TABS
600.0000 mg | ORAL_TABLET | Freq: Four times a day (QID) | ORAL | Status: DC
  Administered 2011-03-14 – 2011-03-15 (×7): 600 mg via ORAL
  Filled 2011-03-13: qty 2
  Filled 2011-03-13: qty 1
  Filled 2011-03-13: qty 2
  Filled 2011-03-13 (×2): qty 1

## 2011-03-13 NOTE — H&P (Signed)
[name] is a 36 y.o. female presenting for labor eval. Having ctx during the early morning at home. Here with husband and doula now. History OB History    Grav Para Term Preterm Abortions TAB SAB Ect Mult Living   15 6 5 1 8 1 7   5      Past Medical History  Diagnosis Date  . Miscarriage     x 8  . ADHD (attention deficit hyperactivity disorder)   . Chromosomal abnormality   . HSV (herpes simplex virus) anogenital infection    Past Surgical History  Procedure Date  . Dilation and curettage of uterus 1998  . Arm surgery for fracture   . Knee surgery    Family History: family history includes Autism spectrum disorder in her daughter and Other in her daughter. Social History:  reports that she has quit smoking. She does not have any smokeless tobacco history on file. She reports that she does not drink alcohol or use illicit drugs.  ROS  Dilation: 7 Effacement (%): 90 Station: -3 Exam by:: Humphrey Rolls, RN Blood pressure 101/68, pulse 100, last menstrual period 06/06/2010, unknown if currently breastfeeding. Maternal Exam:  Uterine Assessment: Ctx 3-4 mins     Fetal Exam Fetal Monitor Review: Baseline rate: 130.  Variability: moderate (6-25 bpm).   Pattern: accelerations present and no decelerations.       Physical Exam  Constitutional: She is oriented to person, place, and time. She appears well-developed and well-nourished.  HENT:  Head: Normocephalic.  Cardiovascular: Normal rate.   Respiratory: Effort normal.  Neurological: She is alert and oriented to person, place, and time.  Skin: Skin is warm and dry.  Psychiatric: She has a normal mood and affect. Her behavior is normal.    Prenatal labs: ABO, Rh:   Antibody: NEG (09/14 1300) Rubella:   RPR: NON REAC (12/07 0922)  HBsAg:    HIV: NON REACTIVE (12/07 0922)  GBS:     Assessment/Plan: IUP at term Active labor LGA SLE  Will admit to L&D Expectant management MD to be made aware at rounds  this morning   [name] 03/13/2011, 7:24 AM

## 2011-03-13 NOTE — Anesthesia Procedure Notes (Signed)
Epidural Patient location during procedure: OB Start time: 03/13/2011 11:37 AM  Staffing Anesthesiologist: Courtnay Petrilla A. Performed by: anesthesiologist   Preanesthetic Checklist Completed: patient identified, site marked, surgical consent, pre-op evaluation, timeout performed, IV checked, risks and benefits discussed and monitors and equipment checked  Epidural Patient position: sitting Prep: site prepped and draped and DuraPrep Patient monitoring: continuous pulse ox and blood pressure Approach: midline Injection technique: LOR air  Needle:  Needle type: Tuohy  Needle gauge: 17 G Needle length: 9 cm Needle insertion depth: 5 cm cm Catheter type: closed end flexible Catheter size: 19 Gauge Catheter at skin depth: 10 cm Test dose: negative and Other  Assessment Events: blood not aspirated, injection not painful, no injection resistance, negative IV test and no paresthesia  Additional Notes Patient identified. Risks and benefits discussed including failed block, incomplete  Pain control, post dural puncture headache, nerve damage, paralysis, blood pressure Changes, nausea, vomiting, reactions to medications-both toxic and allergic and post Partum back pain. All questions were answered. Patient expressed understanding and wished to proceed. Sterile technique was used throughout procedure. Epidural site was Dressed with sterile barrier dressing. No paresthesias, signs of intravascular injection Or signs of intrathecal spread were encountered.  Patient was more comfortable after the epidural was dosed. Please see RN's note for documentation of vital signs and FHR which are stable.

## 2011-03-13 NOTE — Progress Notes (Signed)
Delivery of live viable female by M. Williams, CNM APGARS 9,9  

## 2011-03-13 NOTE — Progress Notes (Signed)
Pt presents to mau for labor check.  Denies leaking.  States + fetal movement.

## 2011-03-13 NOTE — Progress Notes (Deleted)
Pincus Badder, CNM notified of pt presenting.

## 2011-03-13 NOTE — Progress Notes (Signed)
Pincus Badder notified of pt presenting for labor check.  Notified of VE and ctx pattern. Notified of inability to monitor baby.  Notified of last [place] EFW and pt request for water birth.  CNM to place admit orders.

## 2011-03-13 NOTE — Progress Notes (Signed)
Patient ID: [name], female   DOB: 1975/10/17, 36 y.o.   MRN: [id number]  More comfortable now with epidural  FHR stable.  UCs regular  Cervix 10/100/0/vertex feels OP  Will labor down to lower station then push.

## 2011-03-13 NOTE — Progress Notes (Signed)
Pt may go to room 165. 

## 2011-03-13 NOTE — Progress Notes (Signed)
Late Entry for 0900.   Having a lot of back pain. Doula and FOB at bedside. Planning waterbirth  FHR stable with intermittent monitoring UCs every 2 minutes  Cervix:  Dilation: 7 Effacement (%): 90 Cervical Position: Middle Station: -3 Presentation: Vertex Exam by:: Humphrey Rolls, RN  Support through labor.  Anticipate SVD

## 2011-03-13 NOTE — Anesthesia Preprocedure Evaluation (Addendum)
Anesthesia Evaluation  Patient identified by MRN, date of birth, ID band Patient awake    Reviewed: Allergy & Precautions, H&P , Patient's Chart, lab work & pertinent test results  Airway Mallampati: III TM Distance: >3 FB Neck ROM: full    Dental No notable dental hx. (+) Teeth Intact   Pulmonary neg pulmonary ROS,  clear to auscultation  Pulmonary exam normal       Cardiovascular neg cardio ROS regular Normal    Neuro/Psych Anxiety ADD Negative Psych ROS   GI/Hepatic negative GI ROS, Neg liver ROS,   Endo/Other  Morbid obesity  Renal/GU negative Renal ROS  Genitourinary negative   Musculoskeletal   Abdominal   Peds  Hematology negative hematology ROS (+)   Anesthesia Other Findings   Reproductive/Obstetrics (+) Pregnancy                          Anesthesia Physical Anesthesia Plan  ASA: II  Anesthesia Plan: Epidural   Post-op Pain Management:    Induction:   Airway Management Planned:   Additional Equipment:   Intra-op Plan:   Post-operative Plan:   Informed Consent: I have reviewed the patients History and Physical, chart, labs and discussed the procedure including the risks, benefits and alternatives for the proposed anesthesia with the patient or authorized representative who has indicated his/her understanding and acceptance.     Plan Discussed with: Anesthesiologist and Surgeon  Anesthesia Plan Comments:         Anesthesia Quick Evaluation

## 2011-03-13 NOTE — Progress Notes (Signed)
Patient ID: [name], female   DOB: 03-01-1975, 36 y.o.   MRN: [id number]  Tried to push with small amount of progress.  Got epidural topped up a while ago and now feels she is too numb to push. She wants to wait until it wears off some.   FHR stable UCs q 3 min  Cervix Dilation: 10 Dilation Complete Date: 03/13/11 Dilation Complete Time: 1300 Effacement (%): 100 Cervical Position: Middle Station: 0 Presentation: Vertex Exam by:: Artelia Laroche, CNM   Will continue to observe.  Start pushing soon

## 2011-03-13 NOTE — Progress Notes (Signed)
Patient is now requesting epidural .. "I'm done"  Back labor continues.  FHR stable with intermittent monitoring  UCs every 2 min  Cervix 8cm per RN.  Reassurance given.  Will get epidural and expect further progress toward SVD

## 2011-03-14 LAB — CBC
HCT: 31.2 % — ABNORMAL LOW (ref 36.0–46.0)
Hemoglobin: 10.1 g/dL — ABNORMAL LOW (ref 12.0–15.0)
MCV: 82.8 fL (ref 78.0–100.0)
WBC: 13.1 10*3/uL — ABNORMAL HIGH (ref 4.0–10.5)

## 2011-03-14 LAB — URINE MICROSCOPIC-ADD ON

## 2011-03-14 LAB — URINALYSIS, ROUTINE W REFLEX MICROSCOPIC
Glucose, UA: NEGATIVE mg/dL
Specific Gravity, Urine: 1.01 (ref 1.005–1.030)
pH: 6 (ref 5.0–8.0)

## 2011-03-14 MED ORDER — BENZOCAINE-MENTHOL 20-0.5 % EX AERO
INHALATION_SPRAY | CUTANEOUS | Status: AC
  Administered 2011-03-14: 11:00:00
  Filled 2011-03-14: qty 56

## 2011-03-14 MED ORDER — HYDROMORPHONE HCL 2 MG PO TABS
2.0000 mg | ORAL_TABLET | Freq: Once | ORAL | Status: AC
  Administered 2011-03-14: 2 mg via ORAL
  Filled 2011-03-14: qty 1

## 2011-03-14 NOTE — Progress Notes (Signed)
Post Partum Day 1 Subjective: no complaints, up ad lib, voiding, tolerating PO and + flatus.  Reports increased cramping w/ this birth, 'not like the others'- relieved w/ ibuprofen and percocet  Objective: Blood pressure 109/67, pulse 78, temperature 98.2 F (36.8 C), temperature source Oral, resp. rate 18, last menstrual period 06/06/2010, SpO2 99.00%, unknown if currently breastfeeding.  Physical Exam:  General: alert, cooperative, appears stated age and no distress Lochia: appropriate, states usually has large amounts of clots, but hasn't had any yet Uterine Fundus: firm Incision: n/a DVT Evaluation: No evidence of DVT seen on physical exam. Negative Homan's sign. No cords or calf tenderness. No significant calf/ankle edema.   Basename 03/13/11 0745  HGB 11.3*  HCT 34.5*    Assessment/Plan: Plan for discharge tomorrow, Breastfeeding and Lactation consult.  Use LAM for contraception. Doesn't desire circumcision.   LOS: 1 day   Joellyn Haff, SNM 03/14/2011, 7:25 AM

## 2011-03-14 NOTE — Anesthesia Postprocedure Evaluation (Signed)
  Anesthesia Post Note  Patient: [name]  Procedure(s) Performed: * No procedures listed *  Anesthesia type: Epidural  Patient location: Mother/Baby  Post pain: Pain level controlled  Post assessment: Post-op Vital signs reviewed  Last Vitals:  Filed Vitals:   03/14/11 1200  BP: 143/84  Pulse: 83  Temp: 36.3 C  Resp: 22    Post vital signs: Reviewed  Level of consciousness:alert  Complications: No apparent anesthesia complications

## 2011-03-14 NOTE — Progress Notes (Signed)
Patient ID: [name], female   DOB: Jun 04, 1975, 36 y.o.   MRN: [id number] Called to [facility] by RN. Pt crying, reporting severe pain in pelvis, right hip (where baby's head pushed in labor), states "I feel like I'm sitting on a apple".  No fever or dysuria. Small amount of bleeding since delivery. Perineum intact. No hematomas seen. States she has never had pain like this after her other babies. Took Percocet two hours ago adn applied heat pad to abd w/ no relief. Will check CBC, UA, give Dilaudid 2 mg PO x 1, apply abd binder. If no explanation of pain and.or no improvement, will perform speculum exam to evaluate for hematomas and consider imaging PRN per MD. Will notify Dr. Debroah Shannon of pts pain.  Dorathy Shannon 03/14/2011 12:41 PM

## 2011-03-15 MED ORDER — IBUPROFEN 600 MG PO TABS: 600.0000 mg | ORAL_TABLET | Freq: Four times a day (QID) | ORAL | Status: AC

## 2011-03-15 MED ORDER — OXYCODONE-ACETAMINOPHEN 5-325 MG PO TABS: 1.0000 | ORAL_TABLET | ORAL | Status: AC | PRN

## 2011-03-15 NOTE — Discharge Summary (Signed)
Obstetric Discharge Summary Reason for Admission: onset of labor Prenatal Procedures: ultrasound Intrapartum Procedures: spontaneous vaginal delivery Postpartum Procedures: none Complications-Operative and Postpartum: 2nd degree perineal laceration and pelvic pain PPD 1, resolved with pain medication and ice Hemoglobin  Date Value Range Status  03/14/2011 10.1* 12.0-15.0 (g/dL) Final     HCT  Date Value Range Status  03/14/2011 31.2* 36.0-46.0 (%) Final    Discharge Diagnoses: Term Pregnancy-delivered  Discharge Information: Date: 03/15/2011 Activity: pelvic rest Diet: routine Medications: Ibuprofen and Percocet Condition: stable Instructions: refer to practice specific booklet Discharge to: home Follow-up Information    Follow up in 6 weeks. (F/U with [facility])          Newborn Data: Live born female  Birth Weight: 8 lb 14.3 oz (4035 g) APGAR: 9, 9  Home with mother.  [name], Alexsandria Kivett 03/15/2011, 6:57 AM

## 2011-03-15 NOTE — Discharge Instructions (Signed)
Postpartum Care After Vaginal Delivery  After you deliver your baby, you will stay in the hospital for 24 to 72 hours, unless there were problems with the labor or delivery, or you have medical problems. While you are in the hospital, you will receive help and instructions on how to care for yourself and your baby.  Your doctor will order pain medicine, in case you need it. You will have a small amount of bleeding from your vagina and should change your sanitary pad frequently. Wash your hands thoroughly with soap and water for at least 20 seconds after changing pads and using the toilet. Let the nurses know if you begin to pass blood clots or your bleeding increases. Do not flush blood clots down the toilet before having the nurse look at them, to make sure there is no placental tissue with them.  If you had an intravenous (IV), it will be removed within 24 hours, if there are no problems. The first time you get out of bed or take a shower, call the nurse to help you because you may get weak, lightheaded, or even faint. If you are breastfeeding, you may feel painful contractions of your uterus for a couple of weeks. This is normal. The contractions help your uterus get back to normal size. If you are not breastfeeding, wear a supportive bra and handle your breasts as little as possible until your milk has dried up. Hormones should not be given to dry up the breasts, because they can cause blood clots. You will be given your normal diet, unless you have diabetes or other medical problems.   The nurses may put an ice pack on your episiotomy (surgically enlarged opening), if you have one, to reduce the pain and swelling. On rare occasions, you may not be able to urinate and the nurse will need to empty your bladder with a catheter. If you had a postpartum tubal ligation ("tying tubes," female sterilization), it should not make your stay in the hospital longer.  You may have your baby in your room with you as much as  you like, unless you or the baby has a problem. Use the bassinet (basket) for the baby when going to and from the nursery. Do not carry the baby. Do not leave the postpartum area. If the mother is Rh negative (lacks a protein on the red blood cells) and the baby is Rh positive, the mother should get a Rho-gam shot to prevent Rh problems with future pregnancies.  You may be given written instructions for you and your baby, and necessary medicines, when you are discharged from the hospital. Be sure you understand and follow the instructions as advised.  HOME CARE INSTRUCTIONS   · Follow instructions and take the medicines given to you.   · Only take over-the-counter or prescription medicines for pain, discomfort, or fever as directed by your caregiver.   · Do not take aspirin, because it can cause bleeding.   · Increase your activities a little bit every day to build up your strength and endurance.   · Do not drink alcohol, especially if you are breastfeeding or taking pain medicine.   · Take your temperature twice a day and record it.   · You may have a small amount of bleeding or spotting for 2 to 4 weeks. This is normal.   · Do not use tampons or douche. Use sanitary pads.   · Try to have someone stay and help you for a   few days when you go home.   · Try to rest or take a nap when the baby is sleeping.   · If you are breastfeeding, wear a good support bra. If you are not breastfeeding, wear a supportive bra and do not stimulate your nipples.   · Eat a healthy, nutritious diet and continue to take your prenatal vitamins.   · Do not drive, do any heavy activities, or travel until your caregiver tells you it is okay.   · Do not have intercourse until your caregiver gives you permission to do so.   · Ask your caregiver when you can begin to exercise and what type of exercises to do.   · Call your caregiver if you think you are having a problem from your delivery.   · Call your pediatrician if you are having a problem  with the baby.   · Schedule your postpartum visit and keep it.   SEEK MEDICAL CARE IF:   · You have a temperature of 100° F (37.8° C) or higher.   · You have increased vaginal bleeding or are passing clots. Save any clots to show your caregiver.   · You have bloody urine or pain when you urinate.   · You have a bad smelling vaginal discharge.   · You have increasing pain or swelling on your episiotomy.   · You develop a severe headache.   · You feel depressed.   · The episiotomy is separating.   · You become dizzy or lightheaded.   · You develop a rash.   · You have a reaction or problems with your medicine.   · You have pain, redness, or swelling at the intravenous site.   SEEK IMMEDIATE MEDICAL CARE IF:   · You have chest pain.   · You develop shortness of breath.   · You pass out.   · You develop pain, with or without swelling or redness in your leg.   · You develop heavy vaginal bleeding, with or without blood clots.   · You develop stomach pain.   · You develop a bad smelling vaginal discharge.   MAKE SURE YOU:   · Understand these instructions.   · Will watch your condition.   · Will get help right away if you are not doing well or get worse.   Document Released: 11/04/2006 Document Revised: 09/19/2010 Document Reviewed: 11/16/2008  Exit[employer]® Patient Information ©2012 [employer].

## 2011-03-18 NOTE — Discharge Summary (Signed)
Attestation of Attending Supervision of Advanced Practitioner: Evaluation and management procedures were performed by the PA/NP/CNM/OB Fellow under my supervision/collaboration. Chart reviewed, and agree with management and plan.  Elliot Simoneaux, M.D. 03/18/2011 9:00 AM   

## 2011-03-29 ENCOUNTER — Other Ambulatory Visit: Payer: Self-pay | Admitting: Family Medicine

## 2011-03-29 MED ORDER — MUPIROCIN 2 % EX OINT: TOPICAL_OINTMENT | CUTANEOUS | Status: DC

## 2011-04-10 ENCOUNTER — Encounter (HOSPITAL_BASED_OUTPATIENT_CLINIC_OR_DEPARTMENT_OTHER): Payer: Self-pay | Admitting: *Deleted

## 2011-04-10 ENCOUNTER — Emergency Department (HOSPITAL_BASED_OUTPATIENT_CLINIC_OR_DEPARTMENT_OTHER)
Admission: EM | Admit: 2011-04-10 | Discharge: 2011-04-10 | Disposition: A | Discharge status: Home / Self Care | Payer: Managed Care, Other (non HMO) | Attending: Emergency Medicine | Admitting: Emergency Medicine

## 2011-04-10 ENCOUNTER — Emergency Department (INDEPENDENT_AMBULATORY_CARE_PROVIDER_SITE_OTHER): Payer: Managed Care, Other (non HMO)

## 2011-04-10 DIAGNOSIS — N201 Calculus of ureter: Secondary | ICD-10-CM | POA: Insufficient documentation

## 2011-04-10 DIAGNOSIS — R109 Unspecified abdominal pain: Secondary | ICD-10-CM | POA: Insufficient documentation

## 2011-04-10 DIAGNOSIS — N1330 Unspecified hydronephrosis: Secondary | ICD-10-CM

## 2011-04-10 LAB — URINALYSIS, ROUTINE W REFLEX MICROSCOPIC
Bilirubin Urine: NEGATIVE
Ketones, ur: NEGATIVE mg/dL
Nitrite: NEGATIVE
Protein, ur: NEGATIVE mg/dL
Urobilinogen, UA: 0.2 mg/dL (ref 0.0–1.0)
pH: 6 (ref 5.0–8.0)

## 2011-04-10 LAB — URINE MICROSCOPIC-ADD ON

## 2011-04-10 MED ORDER — HYDROMORPHONE HCL 4 MG PO TABS: 4.0000 mg | ORAL_TABLET | ORAL | Status: AC | PRN

## 2011-04-10 MED ORDER — KETOROLAC TROMETHAMINE 15 MG/ML IJ SOLN
15.0000 mg | Freq: Once | INTRAMUSCULAR | Status: AC
  Administered 2011-04-10: 15 mg via INTRAVENOUS
  Filled 2011-04-10: qty 1

## 2011-04-10 MED ORDER — NAPROXEN SODIUM 220 MG PO TABS: ORAL_TABLET | ORAL | Status: DC

## 2011-04-10 MED ORDER — HYDROMORPHONE HCL PF 1 MG/ML IJ SOLN
1.0000 mg | Freq: Once | INTRAMUSCULAR | Status: AC
  Administered 2011-04-10: 1 mg via INTRAVENOUS
  Filled 2011-04-10: qty 1

## 2011-04-10 MED ORDER — ONDANSETRON HCL 4 MG/2ML IJ SOLN
4.0000 mg | Freq: Once | INTRAMUSCULAR | Status: AC
  Administered 2011-04-10: 4 mg via INTRAVENOUS
  Filled 2011-04-10: qty 2

## 2011-04-10 NOTE — ED Notes (Signed)
Pt had no epidural catheter in place upon this ED visit.

## 2011-04-10 NOTE — ED Notes (Signed)
MD at bedside. 

## 2011-04-10 NOTE — ED Notes (Signed)
C/o low back pain that radiates through to abdomen on left side, denies vomiting.

## 2011-04-10 NOTE — Discharge Instructions (Signed)
Ureteral Colic (Kidney Stones) Ureteral colic is the result of a condition when kidney stones form inside the kidney. Once kidney stones are formed they may move into the tube that connects the kidney with the bladder (ureter). If this occurs, this condition may cause pain (colic) in the ureter.  CAUSES  Pain is caused by stone movement in the ureter and the obstruction caused by the stone. SYMPTOMS  The pain comes and goes as the ureter contracts around the stone. The pain is usually intense, sharp, and stabbing in character. The location of the pain may move as the stone moves through the ureter. When the stone is near the kidney the pain is usually located in the back and radiates to the belly (abdomen). When the stone is ready to pass into the bladder the pain is often located in the lower abdomen on the side the stone is located. At this location, the symptoms may mimic those of a urinary tract infection with urinary frequency. Once the stone is located here it often passes into the bladder and the pain disappears completely. TREATMENT   Your caregiver will provide you with medicine for pain relief.   You may require specialized follow-up X-rays.   The absence of pain does not always mean that the stone has passed. It may have just stopped moving. If the urine remains completely obstructed, it can cause loss of kidney function or even complete destruction of the involved kidney. It is your responsibility and in your interest that X-rays and follow-ups as suggested by your caregiver are completed. Relief of pain without passage of the stone can be associated with severe damage to the kidney, including loss of kidney function on that side.   If your stone does not pass on its own, additional measures may be taken by your caregiver to ensure its removal.  HOME CARE INSTRUCTIONS   Increase your fluid intake. Water is the preferred fluid since juices containing vitamin C may acidify the urine making  it less likely for certain stones (uric acid stones) to pass.   Strain all urine. A strainer will be provided. Keep all particulate matter or stones for your caregiver to inspect.   Take your pain medicine as directed.   Make a follow-up appointment with your caregiver as directed.   Remember that the goal is passage of your stone. The absence of pain does not mean the stone is gone. Follow your caregiver's instructions.   Only take over-the-counter or prescription medicines for pain, discomfort, or fever as directed by your caregiver.  SEEK MEDICAL CARE IF:   Pain cannot be controlled with the prescribed medicine.   You have a fever or chills. An infected stone is an emergency.  Pain continues for longer than your caregiver advises it should.   There is a change in the pain, and you develop chest discomfort or constant abdominal pain.   You feel faint or pass out.  MAKE SURE YOU:   Understand these instructions.   Will watch your condition.   Will get help right away if you are not doing well or get worse.  Document Released: 10/17/2004 Document Revised: 12/27/2010 Document Reviewed: 07/04/2010 [facility] Patient Information 2012 [place], [place].

## 2011-04-10 NOTE — ED Provider Notes (Signed)
History     CSN: [id number]  Arrival date & time 04/10/11  0307   First MD Initiated Contact with Patient 04/10/11 [phone]      Chief Complaint  Patient presents with  . Abdominal Pain    (Consider location/radiation/quality/duration/timing/severity/associated sxs/prior treatment) HPI This is a 36 year old white female is 3-1/2 weeks postpartum. She presents with the sudden onset of severe left flank pain radiating around to the right lower quadrant. This came on about 3 hours ago. The pain is constant and not mitigated her exacerbated by anything. She took a Percocet without relief. She attempted to move her bowels without relief. She has no history of kidney stones. She denies nausea or vomiting with this.  Past Medical History  Diagnosis Date  . Miscarriage     x 8  . ADHD (attention deficit hyperactivity disorder)   . Chromosomal abnormality   . HSV (herpes simplex virus) anogenital infection     Past Surgical History  Procedure Date  . Dilation and curettage of uterus 1998  . Arm surgery for fracture   . Knee surgery     Family History  Problem Relation Age of Onset  . Autism spectrum disorder Daughter   . Other Daughter     Pancreatic insufficiency.     History  Substance Use Topics  . Smoking status: Former Games developer  . Smokeless tobacco: Not on file  . Alcohol Use: No    OB History    Grav Para Term Preterm Abortions TAB SAB Ect Mult Living   15 6 5 1 9 1 8  0 0 6      Review of Systems  All other systems reviewed and are negative.    Allergies  Review of patient's allergies indicates no known allergies.  Home Medications   Current Outpatient Rx  Name Route Sig Dispense Refill  . MUPIROCIN 2 % EX OINT  Apply to affected area 3 times daily 22 g 0    BP 145/81  Pulse 85  Temp(Src) 97.3 F (36.3 C) (Oral)  Resp 20  Ht 5\' 6"  (1.676 m)  Wt 240 lb (108.863 kg)  BMI 38.74 kg/m2  SpO2 100%  Physical Exam General: Well-developed, well-nourished  female in no acute distress; appearance consistent with age of record; uncomfortable appearing HENT: normocephalic, atraumatic Eyes: pupils equal round and reactive to light; extraocular muscles intact Neck: supple Heart: regular rate and rhythm Lungs: clear to auscultation bilaterally Abdomen: soft; obese; nontender GU: No flank tenderness Extremities: No deformity; full range of motion Neurologic: Awake, alert and oriented; motor function intact in all extremities and symmetric; no facial droop Skin: Warm and dry Psychiatric: Anxious    ED Course  Procedures (including critical care time)     MDM   Nursing notes and vitals signs, including pulse oximetry, reviewed.  Summary of this visit's results, reviewed by myself:  Labs:  Results for orders placed during the hospital encounter of 04/10/11  URINALYSIS, ROUTINE W REFLEX MICROSCOPIC      Component Value Range   Color, Urine YELLOW  YELLOW    APPearance CLEAR  CLEAR    Specific Gravity, Urine 1.028  1.005 - 1.030    pH 6.0  5.0 - 8.0    Glucose, UA NEGATIVE  NEGATIVE (mg/dL)   Hgb urine dipstick LARGE (*) NEGATIVE    Bilirubin Urine NEGATIVE  NEGATIVE    Ketones, ur NEGATIVE  NEGATIVE (mg/dL)   Protein, ur NEGATIVE  NEGATIVE (mg/dL)   Urobilinogen, UA 0.2  0.0 - 1.0 (mg/dL)   Nitrite NEGATIVE  NEGATIVE    Leukocytes, UA SMALL (*) NEGATIVE   URINE MICROSCOPIC-ADD ON      Component Value Range   Squamous Epithelial / LPF RARE  RARE    WBC, UA 3-6  <3 (WBC/hpf)   RBC / HPF TOO NUMEROUS TO COUNT  <3 (RBC/hpf)   Bacteria, UA FEW (*) RARE     Imaging Studies: Ct Abdomen Pelvis Wo Contrast  04/10/2011  *RADIOLOGY REPORT*  Clinical Data: Left flank pain.  CT ABDOMEN AND PELVIS WITHOUT CONTRAST  Technique:  Multidetector CT imaging of the abdomen and pelvis was performed following the standard protocol without intravenous contrast.  Comparison: Prior ultrasound of pregnancy performed 03/06/2011  Findings: The visualized  lung bases are clear.  The liver and spleen are unremarkable in appearance.  The gallbladder is within normal limits.  The pancreas and adrenal glands are unremarkable.  There is very mild left-sided hydronephrosis, with prominence of the left ureter to the level of an obstructing 3 mm stone at the left vesicoureteral junction.  There is somewhat prominent soft tissue stranding about the left renal pelvis and portions of the left kidney, raising question for pyelonephritis.  The right kidney is unremarkable in appearance.  No nonobstructing renal stones are seen on either side.  No free fluid is identified.  The small bowel is unremarkable in appearance.  The stomach is within normal limits.  No acute vascular abnormalities are seen.  The appendix is normal in size, without evidence for appendicitis. The colon is largely filled with stool and is unremarkable in appearance.  The bladder is mildly distended and appears grossly unremarkable. The uterus is grossly normal in appearance.  The ovaries are relatively symmetric.  No suspicious adnexal masses are seen.  No inguinal lymphadenopathy is seen.  No acute osseous abnormalities are identified.  IMPRESSION:  1.  Very mild left-sided hydronephrosis, with an obstructing 3 mm distal stone at the left vesicoureteral junction. 2.  Somewhat prominent soft tissue stranding about the left renal pelvis and portions of the left kidney, raising question for pyelonephritis.  Original Report Authenticated By: Tonia Ghent, M.D.   5:57 AM Pain controlled. Symptoms and urinalysis not consistent with pyelonephritis at this time. Patient was advised to return for fevers, chills or other evidence of infection. Urine has been sent for culture. Will refer to [facility].  She understands not to breast-feed while taking narcotic medications. She has frozen breast milk as well as formula at home for the interim.          Hanley Seamen, MD 04/10/11 [phone]

## 2011-04-10 NOTE — ED Notes (Signed)
Patient transported to CT 

## 2011-04-10 NOTE — ED Notes (Signed)
Pt return from CT, NAD noted 

## 2011-04-10 NOTE — ED Notes (Signed)
Pt states that she has been having low back pain that radiates to her abdominal area on the left side since earlier tonight. Pt has taken OTC antacids and meds for gas without relief. Pt states that she does has a history of problems with constipation since childbirth 3 weeks ago.

## 2011-04-11 LAB — URINE CULTURE: Culture  Setup Time: 201303201055

## 2011-04-12 ENCOUNTER — Encounter (HOSPITAL_BASED_OUTPATIENT_CLINIC_OR_DEPARTMENT_OTHER): Payer: Self-pay | Admitting: *Deleted

## 2011-04-12 ENCOUNTER — Ambulatory Visit (INDEPENDENT_AMBULATORY_CARE_PROVIDER_SITE_OTHER): Payer: Managed Care, Other (non HMO) | Admitting: Obstetrics and Gynecology

## 2011-04-12 ENCOUNTER — Emergency Department (INDEPENDENT_AMBULATORY_CARE_PROVIDER_SITE_OTHER): Payer: Managed Care, Other (non HMO)

## 2011-04-12 ENCOUNTER — Encounter: Payer: Self-pay | Admitting: Obstetrics and Gynecology

## 2011-04-12 ENCOUNTER — Emergency Department (HOSPITAL_BASED_OUTPATIENT_CLINIC_OR_DEPARTMENT_OTHER)
Admission: EM | Admit: 2011-04-12 | Discharge: 2011-04-12 | Disposition: A | Discharge status: Home / Self Care | Payer: Managed Care, Other (non HMO) | Attending: Emergency Medicine | Admitting: Emergency Medicine

## 2011-04-12 VITALS — BP 109/66 | HR 100 | Temp 98.6°F | Resp 16 | Ht 66.0 in | Wt 245.0 lb

## 2011-04-12 DIAGNOSIS — R509 Fever, unspecified: Secondary | ICD-10-CM | POA: Insufficient documentation

## 2011-04-12 DIAGNOSIS — R109 Unspecified abdominal pain: Secondary | ICD-10-CM

## 2011-04-12 DIAGNOSIS — N201 Calculus of ureter: Secondary | ICD-10-CM

## 2011-04-12 DIAGNOSIS — R05 Cough: Secondary | ICD-10-CM | POA: Insufficient documentation

## 2011-04-12 DIAGNOSIS — O9883 Other maternal infectious and parasitic diseases complicating the puerperium: Secondary | ICD-10-CM

## 2011-04-12 DIAGNOSIS — F909 Attention-deficit hyperactivity disorder, unspecified type: Secondary | ICD-10-CM | POA: Insufficient documentation

## 2011-04-12 DIAGNOSIS — N390 Urinary tract infection, site not specified: Secondary | ICD-10-CM | POA: Insufficient documentation

## 2011-04-12 DIAGNOSIS — J029 Acute pharyngitis, unspecified: Secondary | ICD-10-CM | POA: Insufficient documentation

## 2011-04-12 DIAGNOSIS — R059 Cough, unspecified: Secondary | ICD-10-CM

## 2011-04-12 LAB — URINALYSIS, MICROSCOPIC ONLY
Glucose, UA: NEGATIVE mg/dL
Ketones, ur: NEGATIVE mg/dL
Protein, ur: NEGATIVE mg/dL

## 2011-04-12 LAB — RAPID STREP SCREEN (MED CTR MEBANE ONLY): Streptococcus, Group A Screen (Direct): NEGATIVE

## 2011-04-12 MED ORDER — CEFTRIAXONE SODIUM 1 G IJ SOLR
1.0000 g | Freq: Once | INTRAMUSCULAR | Status: DC
  Filled 2011-04-12: qty 10

## 2011-04-12 MED ORDER — LIDOCAINE HCL (PF) 1 % IJ SOLN
INTRAMUSCULAR | Status: AC
  Filled 2011-04-12: qty 5

## 2011-04-12 MED ORDER — CEPHALEXIN 500 MG PO CAPS: 500.0000 mg | ORAL_CAPSULE | Freq: Four times a day (QID) | ORAL | Status: AC

## 2011-04-12 MED ORDER — ACETAMINOPHEN 325 MG PO TABS
650.0000 mg | ORAL_TABLET | Freq: Once | ORAL | Status: AC
  Administered 2011-04-12: 650 mg via ORAL
  Filled 2011-04-12: qty 2

## 2011-04-12 MED ORDER — CEPHALEXIN 250 MG PO CAPS
500.0000 mg | ORAL_CAPSULE | Freq: Once | ORAL | Status: AC
  Administered 2011-04-12: 500 mg via ORAL

## 2011-04-12 MED ORDER — CEPHALEXIN 250 MG PO CAPS
ORAL_CAPSULE | ORAL | Status: AC
  Filled 2011-04-12: qty 2

## 2011-04-12 NOTE — ED Notes (Signed)
Pt was seen here and dx'd with kidney stone 2 nights ago. Pt now has developed low-grade temp.

## 2011-04-12 NOTE — Patient Instructions (Signed)
  Place postpartum visit patient instructions here.  

## 2011-04-12 NOTE — ED Notes (Signed)
Pt questioning the lactation contraindications regarding medications. MD made aware and will go speak with pt to explain treatment options.

## 2011-04-12 NOTE — ED Provider Notes (Signed)
History     CSN: [id number]  Arrival date & time 04/12/11  0135   First MD Initiated Contact with Patient 04/12/11 0300      Chief Complaint  Patient presents with  . Fever    (Consider location/radiation/quality/duration/timing/severity/associated sxs/prior treatment) Patient is a 36 y.o. female presenting with fever. The history is provided by the patient. No language interpreter was used.  Fever Primary symptoms of the febrile illness include fever and cough. Primary symptoms do not include fatigue, visual change, headaches or shortness of breath. The current episode started today. This is a new problem. The problem has not changed since onset. The fever began today. The fever has been unchanged since its onset. The maximum temperature recorded prior to her arrival was 102 to 102.9 F. The temperature was taken by an oral thermometer.  The cough began today. The cough is new. The cough is non-productive.  Associated with: none. Primary symptoms comment: sore throat  States she has some low back pain, and was diagnosed with a kidney stone 2 days ago and is concerned about infection.    Past Medical History  Diagnosis Date  . Miscarriage     x 8  . ADHD (attention deficit hyperactivity disorder)   . Chromosomal abnormality   . HSV (herpes simplex virus) anogenital infection     Past Surgical History  Procedure Date  . Dilation and curettage of uterus 1998  . Arm surgery for fracture   . Knee surgery     Family History  Problem Relation Age of Onset  . Autism spectrum disorder Daughter   . Other Daughter     Pancreatic insufficiency.     History  Substance Use Topics  . Smoking status: Former Games developer  . Smokeless tobacco: Not on file  . Alcohol Use: No    OB History    Grav Para Term Preterm Abortions TAB SAB Ect Mult Living   15 6 5 1 9 1 8  0 0 6      Review of Systems  Constitutional: Positive for fever. Negative for fatigue.  HENT: Positive for sore  throat. Negative for neck stiffness.   Eyes: Negative.   Respiratory: Positive for cough. Negative for shortness of breath.   Cardiovascular: Negative.   Genitourinary: Negative for difficulty urinating.  Musculoskeletal: Negative.   Neurological: Negative for headaches.  Hematological: Negative.   Psychiatric/Behavioral: Negative.     Allergies  Review of patient's allergies indicates no known allergies.  Home Medications   Current Outpatient Rx  Name Route Sig Dispense Refill  . HYDROMORPHONE HCL 4 MG PO TABS Oral Take 1 tablet (4 mg total) by mouth every 4 (four) hours as needed for pain. 30 tablet 0  . MUPIROCIN 2 % EX OINT  Apply to affected area 3 times daily 22 g 0  . NAPROXEN SODIUM 220 MG PO TABS  Take 2 tablets twice a day until stone passes to      BP 137/82  Pulse 110  Temp(Src) 101 F (38.3 C) (Oral)  Resp 20  SpO2 97%  Breastfeeding? Yes  Physical Exam  Constitutional: She is oriented to person, place, and time. She appears well-developed and well-nourished.  HENT:  Head: Normocephalic and atraumatic.  Mouth/Throat: Oropharynx is clear and moist.  Eyes: Conjunctivae are normal. Pupils are equal, round, and reactive to light.  Neck: Normal range of motion.  Cardiovascular: Normal rate and regular rhythm.   Pulmonary/Chest: Effort normal and breath sounds normal.  Abdominal: Soft. Bowel  sounds are normal. There is no tenderness. There is no rebound and no guarding.  Musculoskeletal: Normal range of motion.  Neurological: She is alert and oriented to person, place, and time.  Skin: Skin is warm and dry.  Psychiatric: She has a normal mood and affect.    ED Course  Procedures (including critical care time)  Labs Reviewed  URINALYSIS, WITH MICROSCOPIC - Abnormal; Notable for the following:    Hgb urine dipstick LARGE (*)    Leukocytes, UA MODERATE (*)    Bacteria, UA FEW (*)    Squamous Epithelial / LPF FEW (*)    All other components within normal  limits  RAPID STREP SCREEN   Dg Abd Acute W/chest  04/12/2011  **ADDENDUM** CREATED: 04/12/2011 04:38:35  The 3 mm distal obstructing stone at the left vesicoureteral junction appears grossly unchanged in location from the recent prior CT of the abdomen and pelvis.  **END ADDENDUM** SIGNED BY: Tonia Ghent, M.D.    04/12/2011  *RADIOLOGY REPORT*  Clinical Data: Fever and abdominal pain.  ACUTE ABDOMEN SERIES (ABDOMEN 2 VIEW & CHEST 1 VIEW)  Comparison: CT of the abdomen and pelvis performed 04/10/2011  Findings: The lungs are well-aerated and clear.  There is no evidence of focal opacification, pleural effusion or pneumothorax. Pulmonary vascularity is at the upper limits of normal.  The cardiomediastinal silhouette is within normal limits.  The visualized bowel gas pattern is unremarkable.  The colon is filled with stool; there is no evidence of small bowel dilatation to suggest obstruction.  No free intra-abdominal air is identified on the provided upright view.  No acute osseous abnormalities are seen; the sacroiliac joints are unremarkable in appearance.  IMPRESSION:  1.  Unremarkable bowel gas pattern; no free intra-abdominal air seen.  Colon filled with stool, raising question for mild constipation. 2.  No acute cardiopulmonary process identified.  Original Report Authenticated By: Tonia Ghent, M.D.     No diagnosis found.    MDM  Case d/w [place] NP in [facility]U, keflex is safe in lactation.  Follow up in MAU.  Follow up with urology for stone.   Patient informed to take all antibiotics, return if no change in fever in 48 hours or worsening cough, vomiting shaking chills or back pain.  Patient verbalizes understanding and agrees to follow up       Kambria Grima Smitty Cords, MD 04/12/11 [phone]

## 2011-04-12 NOTE — Progress Notes (Signed)
  Subjective:     [name] is a 36 y.o. female who presents for a postpartum visit. She is 4 weeks postpartum following a spontaneous vaginal delivery. I have fully reviewed the prenatal and intrapartum course. The delivery was at 40 gestational weeks. Outcome: spontaneous vaginal delivery. Anesthesia: epidural. Postpartum course has been complicated by persisting discomfort at site of 2nd degree lac with difficult defecating due to what she says is inability to push out stool.; however denies constipation. Her main complication has been left ureteral calculus dx at [facility] with CT 3 days ago; return last night due to fever and now on Keflex and Percocet. Has not passed stone. Baby's course has been uncomplicated.[name] is feeding by both breast and bottle - Similac Neosure. Bleeding no bleeding. Bowel function is as above. States has int hemorrhoids. . Bladder function is normal. Patient is not sexually active. Contraception method is abstinence. Postpartum depression screening: negative.  The following portions of the patient's history were reviewed and updated as appropriate: allergies, current medications, past family history, past medical history, past social history, past surgical history and problem list.  Review of Systems Pertinent items are noted in HPI.   Objective:    BP 109/66  Pulse 100  Temp(Src) 98.6 F (37 C) (Oral)  Resp 16  Ht 5\' 6"  (1.676 m)  Wt 245 lb (111.131 kg)  BMI 39.54 kg/m2  Breastfeeding? Yes  General:  alert, cooperative, fatigued and mild distress   Breasts:  inspection negative, no nipple discharge or bleeding, no masses or nodularity palpable and lactational. No inflammation or induration.  Lungs: clear to auscultation bilaterally  Heart:  regular rate and rhythm, S1, S2 normal, no murmur, click, rub or gallop  Abdomen: soft, non-tender; bowel sounds normal; no masses,  no organomegaly and diastasis of 2 fb.    Vulva:  intact with vicryl sutures not  completely absorbed. Sm cystocele.. Minimal tone.  Vagina: see above. No blood  Cervix:  not examined  Corpus: enlarged, 12 weeks size  Adnexa:  not evaluated  Rectal Exam: Not performed. small ext hemorrhoidal tag.        Back: sl CVAT on left Assessment:    4 wk postpartum exam. Pap smear not done at today's visit.  Left ureteral calculus and presmed UTI (culture 04/10/11 pending)  Plan:    1. Contraception: rhythm method. Plans abstinence a while longer 2. Continue Keflex, fluids, rest Discussed L&D experience, pumping, exercises. Return 1 yr and prn 3. Follow up in: 1 yr and prn

## 2011-04-12 NOTE — ED Notes (Signed)
Patient transported to X-ray 

## 2011-04-13 ENCOUNTER — Telehealth: Payer: Self-pay | Admitting: Family Medicine

## 2011-04-13 NOTE — Telephone Encounter (Signed)
[facility] illness twice in the last several days, started on keflex empirically while strep culture pending. Says ST severe, asking if she should go back per call-a-nurse.  She is not doing any local soothing treatments to the pharynx. I suggested she do salt water gargle, chloraseptic spray, lozenges, tylenol q6h prn and if she feels like she is still getting worse then "see within 24h" is reasonable.  --PM

## 2011-04-14 LAB — URINE CULTURE: Culture  Setup Time: 201303230222

## 2011-04-15 ENCOUNTER — Encounter: Payer: Self-pay | Admitting: Family Medicine

## 2011-04-15 ENCOUNTER — Ambulatory Visit (INDEPENDENT_AMBULATORY_CARE_PROVIDER_SITE_OTHER): Payer: Managed Care, Other (non HMO) | Admitting: Family Medicine

## 2011-04-15 VITALS — BP 126/84 | HR 97 | Temp 98.4°F | Wt 242.0 lb

## 2011-04-15 DIAGNOSIS — J329 Chronic sinusitis, unspecified: Secondary | ICD-10-CM

## 2011-04-15 DIAGNOSIS — H109 Unspecified conjunctivitis: Secondary | ICD-10-CM

## 2011-04-15 MED ORDER — AMOXICILLIN-POT CLAVULANATE 875-125 MG PO TABS: 1.0000 | ORAL_TABLET | Freq: Two times a day (BID) | ORAL | Status: AC

## 2011-04-15 MED ORDER — AZITHROMYCIN 1 % OP SOLN: 1.0000 [drp] | Freq: Two times a day (BID) | OPHTHALMIC | Status: AC

## 2011-04-15 NOTE — Progress Notes (Signed)
  Subjective:    Patient ID: [name], female    DOB: Sep 10, 1975, 36 y.o.   MRN: [id number]  HPI FEver and nasall congesterion for over a week. FEver was 102 initially. She was recently seen emergency Department for a kidney stone. She finally passed the stone yesterday.  Was put on keflex.  Cough sputum production is yellow and is now getting more chuncky and doesn't feel any better.  Still having thick nasal mucous and drainage. [name] is getting better.  Right eye is turning pink and weepy. Ithcy yesterday.  On keflex TID. She is not taking anything over-the-counter. Positive sick contacts with children who are sick at home. She also has 1 child, has bacterial conjunctivitis.   Review of Systems     Objective:   Physical Exam  Constitutional: She is oriented to person, place, and time. She appears well-developed and well-nourished.  HENT:  Head: Normocephalic and atraumatic.  Right Ear: External ear normal.  Left Ear: External ear normal.  Nose: Nose normal.  Mouth/Throat: Oropharynx is clear and moist.       TMs and canals are clear. Left post LN. her tonsils are very swollen today with tonsillar stones.  Eyes: Conjunctivae and EOM are normal. Pupils are equal, round, and reactive to light.  Neck: Neck supple. No thyromegaly present.  Cardiovascular: Normal rate, regular rhythm and normal heart sounds.   Pulmonary/Chest: Effort normal and breath sounds normal. She has no wheezes.  Lymphadenopathy:    She has no cervical adenopathy.  Neurological: She is alert and oriented to person, place, and time.  Skin: Skin is warm and dry.  Psychiatric: She has a normal mood and affect.          Assessment & Plan:  Sinusitis - likely bacterial at this point. 2:30 on Keflex and she has not felt any better. I will change her to augmintin BID.  Call if not significantly better in one week. Continue symptomatic care.  Bacterial conjunctivitis, right eye-I will treat with azithromycin  eyedrops. Call if not better in one week.

## 2011-04-15 NOTE — Patient Instructions (Signed)

## 2011-05-24 ENCOUNTER — Other Ambulatory Visit: Payer: Self-pay | Admitting: Family Medicine

## 2011-05-24 MED ORDER — FLUCONAZOLE 150 MG PO TABS: 150.0000 mg | ORAL_TABLET | Freq: Once | ORAL | Status: AC

## 2011-07-19 ENCOUNTER — Ambulatory Visit: Payer: Managed Care, Other (non HMO) | Admitting: Physician Assistant

## 2011-08-30 ENCOUNTER — Encounter: Payer: Self-pay | Admitting: Family

## 2011-08-30 ENCOUNTER — Ambulatory Visit (INDEPENDENT_AMBULATORY_CARE_PROVIDER_SITE_OTHER): Payer: Managed Care, Other (non HMO) | Admitting: Family

## 2011-08-30 VITALS — BP 100/80 | HR 72 | Resp 16 | Ht 66.0 in | Wt 237.0 lb

## 2011-08-30 DIAGNOSIS — Z3009 Encounter for other general counseling and advice on contraception: Secondary | ICD-10-CM

## 2011-08-30 MED ORDER — DIAPHRAGM ARC-SPRING 65 MM VA DPRH: 1.0000 [IU] | VAGINAL_INSERT | VAGINAL | Status: DC | PRN

## 2011-08-30 NOTE — Progress Notes (Signed)
  Subjective:    Patient ID: [name], female    DOB: 18-Jul-1975, 36 y.o.   MRN: [id number]  HPI Pt is here for fitting for diaphragm.  Verbalized does not want hormonal forms of birth control or anything that fertilization may still occur (IUD).  States not ready for permanent methods at this time.  Increased stress and responsibility - daughter diagnosed with autism and feels like another child at this time would be "too much".  Also reports having problems with sex, husband states she is "too loose" and he can't feel her.  Pt has tried kegal's, [employer] with little help.  Would like to discuss alternative options.     Review of Systems See HPI    Objective:   Physical Exam  Constitutional: She is oriented to person, place, and time. She appears well-developed and well-nourished. No distress.  HENT:  Head: Normocephalic.  Neck: Normal range of motion. Neck supple.  Genitourinary: Vagina normal and uterus normal. No vaginal discharge found.       No apparent deformities to vaginal area; area intact  Musculoskeletal: Normal range of motion. She exhibits no edema.  Neurological: She is alert and oriented to person, place, and time.  Skin: Skin is warm and dry.  Psychiatric: She has a normal mood and affect. Her behavior is normal.   Pt sized for diaphragm using pessary rings - size 4 was appropriate size       Assessment & Plan:  Sexual Dysfunction Family Planning Counseling  Plan: Pap due - pt will reschedule RX diaphragm - pt will use alternative to spermicide, explained it will decrease the effectiveness, pt verbalizes understanding Pt will reschedule appt with Dr. Marice Potter to discuss sexual/vaginal concerns.  [facility]

## 2011-09-05 ENCOUNTER — Ambulatory Visit: Payer: Managed Care, Other (non HMO) | Admitting: Obstetrics & Gynecology

## 2011-09-05 DIAGNOSIS — Z124 Encounter for screening for malignant neoplasm of cervix: Secondary | ICD-10-CM

## 2011-10-09 ENCOUNTER — Ambulatory Visit: Payer: Managed Care, Other (non HMO) | Admitting: Obstetrics & Gynecology

## 2011-10-09 DIAGNOSIS — Z01419 Encounter for gynecological examination (general) (routine) without abnormal findings: Secondary | ICD-10-CM

## 2011-12-31 ENCOUNTER — Ambulatory Visit: Payer: Managed Care, Other (non HMO) | Admitting: Family Medicine

## 2011-12-31 ENCOUNTER — Telehealth: Payer: Self-pay | Admitting: *Deleted

## 2011-12-31 MED ORDER — FLUCONAZOLE 150 MG PO TABS: ORAL_TABLET | ORAL | Status: AC

## 2011-12-31 NOTE — Telephone Encounter (Signed)
Pt called c/o yeast around her areola and her baby has mouth thrush.  Mother needs Rx for Diflucan and nystatin called to pharm. No allergies [facility] [address]

## 2012-01-22 NOTE — L&D Delivery Note (Signed)
Delivery Note At 11:59 PM a viable female was delivered via Vaginal, Spontaneous Delivery (Presentation: direct occiput anterior ).  APGAR: 9, 9 ; weight TBD.   Placenta status: Intact, Spontaneous.  Cord: 3 vessels with the following complications: None.    Anesthesia: Epidural  Episiotomy: None Lacerations: 1st degree Suture Repair: 3.0 vicryl Est. Blood Loss (mL): 300  Mom to postpartum.  Baby to nursery-stable.  Pt progressed to c/c/+2 and pushed to deliver a liveborn female infant via SVD with spontaneous cry.  Apgars were 9 and 9. Placenta delivered spontaneously intact with 3 vessel cord. No postpartum hemorrhage. Mom and baby stable.  1st degree repaired with single stitch for hemostasis.   Ladoris Lythgoe L 10/27/2012, 12:25 AM

## 2012-03-03 ENCOUNTER — Telehealth: Payer: Self-pay | Admitting: *Deleted

## 2012-03-03 DIAGNOSIS — O219 Vomiting of pregnancy, unspecified: Secondary | ICD-10-CM

## 2012-03-03 MED ORDER — PROMETHAZINE HCL 25 MG PO TABS: 25.0000 mg | ORAL_TABLET | Freq: Four times a day (QID) | ORAL | Status: DC | PRN

## 2012-03-03 NOTE — Telephone Encounter (Signed)
Pt called requesting Phenergan for her nausea in pregnancy.  She has tried Ginger tea and B6.  Per pt this regimen has not worked.  [facility] [place]

## 2012-03-10 ENCOUNTER — Encounter: Payer: BC Managed Care – PPO | Admitting: Obstetrics & Gynecology

## 2012-03-10 ENCOUNTER — Ambulatory Visit (INDEPENDENT_AMBULATORY_CARE_PROVIDER_SITE_OTHER): Payer: BC Managed Care – PPO | Admitting: Obstetrics & Gynecology

## 2012-03-10 ENCOUNTER — Encounter: Payer: Self-pay | Admitting: Obstetrics & Gynecology

## 2012-03-10 VITALS — BP 129/81 | Temp 97.5°F | Wt 240.0 lb

## 2012-03-10 DIAGNOSIS — O09299 Supervision of pregnancy with other poor reproductive or obstetric history, unspecified trimester: Secondary | ICD-10-CM

## 2012-03-10 DIAGNOSIS — O2620 Pregnancy care for patient with recurrent pregnancy loss, unspecified trimester: Secondary | ICD-10-CM

## 2012-03-10 DIAGNOSIS — Z3480 Encounter for supervision of other normal pregnancy, unspecified trimester: Secondary | ICD-10-CM | POA: Insufficient documentation

## 2012-03-10 MED ORDER — PROGESTERONE 8 % VA GEL: 1.0000 | Freq: Every day | VAGINAL | Status: DC

## 2012-03-10 NOTE — Addendum Note (Signed)
Addended by: Jaynie Collins A on: 03/10/2012 04:57 PM   Modules accepted: Orders

## 2012-03-10 NOTE — Progress Notes (Signed)
Subjective:    [name] is a [id number] [redacted]w[redacted]d being seen today for her first obstetrical visit.  Her obstetrical history is significant for recurrent SABs, daughter with autism and VSD, history of fast labors, and short interpregnancy intervals. Patient does intend to breast feed. Pregnancy history fully reviewed.  Patient reports nausea and vomiting, declines any intervention.  She requests Rx for Crinone gel given her history of recurrent SABs.  Declines pelvic examination at this visit.  Filed Vitals:   03/10/12 1409  BP: 129/81  Temp: 97.5 F (36.4 C)  Weight: 240 lb (108.863 kg)    HISTORY: OB History   Grav Para Term Preterm Abortions TAB SAB Ect Mult Living   16 6 5 1 9 1 8  0 0 6     # Outc Date GA Lbr Len/2nd Wgt Sex Del Anes PTL Lv   1 TRM 2/97 [redacted]w[redacted]d  7lb3oz(3.26kg) F SVD EPI No Yes   Comments: adopted   2 TAB 1/98           3 PRE 5/04   4lb13oz(2.183kg) F SVD EPI No Yes   4 TRM 1/06 [redacted]w[redacted]d  7lb13oz(3.544kg) F SVD None No Yes   5 TRM 7/09 [redacted]w[redacted]d  8lb7oz(3.827kg) M       6 TRM 5/11 [redacted]w[redacted]d  8lb3oz(3.714kg) F SVD None No Yes   Comments: delivered on route to hosiptal.  Autistic, Dx w/ VSD 2013- [facility]   7 [facility] 2/13 [redacted]w[redacted]d 09:30 / 03:38 8lb14.3oz(4.035kg) M SVD EPI  Yes   8 SAB            9 SAB            10 SAB            11 SAB            12 SAB            13 SAB            14 SAB            15 SAB            16 CUR              Past Medical History  Diagnosis Date  . Miscarriage     x 8  . ADHD (attention deficit hyperactivity disorder)     husband  . Chromosomal abnormality   . HSV (herpes simplex virus) anogenital infection   . Kidney stone on left side    Past Surgical History  Procedure Laterality Date  . Dilation and curettage of uterus  1998  . Arm surgery for fracture    . Knee surgery    . Wisdom tooth extraction     Family History  Problem Relation Age of Onset  . Autism spectrum disorder Daughter   . Other Daughter    Pancreatic insufficiency.      Exam    Uterus:     Pelvic Exam: Declined as per patient's request  System: Breast:  normal appearance, no masses or tenderness   Skin: normal coloration and turgor, no rashes    Neurologic: normal   Extremities: normal strength, tone, and muscle mass   HEENT PERRLA   Mouth/Teeth mucous membranes moist, pharynx normal without lesions and dental hygiene good   Neck supple and no masses   Cardiovascular: regular rate and rhythm   Respiratory:  appears well, vitals normal, no respiratory distress, acyanotic, normal RR   Abdomen:  soft, non-tender; bowel sounds normal; no masses,  no organomegaly   Clinicultrasound showed viable fetus with heartbeat    Assessment:    Pregnancy: [id number] Patient Active Problem List  Diagnosis  . HSV  . ATTENTION DEFICIT, W/HYPERACTIVITY  . Lupus anticoagulant positive  . Obesity  . Recurrent pregnancy loss, antepartum  . Chromosomal abnormality  . BURN, FOREARM  . History of precipitous labor and deliveries, antepartum  . Ureteral calculus, left  . Supervision of normal intrauterine pregnancy in multigravida  . Previous child with cardiac abnormality, antepartum      Plan:   Initial labs drawn. Continue Prenatal vitamins; Crinone prescribed as rerquested . Problem list reviewed and updated. Genetic Screening discussed First Screen: ordered. Ultrasound discussed; fetal survey: to be ordered around 22-25 weeks as per patient's request. Also desires fetal ECHO given child's history of VSD.Marland Kitchen Follow up in 4 weeks.  Camelle Henkels A 03/10/2012

## 2012-03-10 NOTE — Progress Notes (Signed)
P = 94    Pt reports morning sickness

## 2012-03-10 NOTE — Patient Instructions (Signed)
Pregnancy - First Trimester During sexual intercourse, millions of sperm go into the vagina. Only 1 sperm will penetrate and fertilize the female egg while it is in the Fallopian tube. One week later, the fertilized egg implants into the wall of the uterus. An embryo begins to develop into a baby. At 6 to 8 weeks, the eyes and face are formed and the heartbeat can be seen on ultrasound. At the end of 12 weeks (first trimester), all the baby's organs are formed. Now that you are pregnant, you will want to do everything you can to have a healthy baby. Two of the most important things are to get good prenatal care and follow your caregiver's instructions. Prenatal care is all the medical care you receive before the baby's birth. It is given to prevent, find, and treat problems during the pregnancy and childbirth. PRENATAL EXAMS  During prenatal visits, your weight, blood pressure and urine are checked. This is done to make sure you are healthy and progressing normally during the pregnancy.  A pregnant woman should gain 25 to 35 pounds during the pregnancy. However, if you are over weight or underweight, your caregiver will advise you regarding your weight.  Your caregiver will ask and answer questions for you.  Blood work, cervical cultures, other necessary tests and a Pap test are done during your prenatal exams. These tests are done to check on your health and the probable health of your baby. Tests are strongly recommended and done for HIV with your permission. This is the virus that causes AIDS. These tests are done because medications can be given to help prevent your baby from being born with this infection should you have been infected without knowing it. Blood work is also used to find out your blood type, previous infections and follow your blood levels (hemoglobin).  Low hemoglobin (anemia) is common during pregnancy. Iron and vitamins are given to help prevent this. Later in the pregnancy, blood  tests for diabetes will be done along with any other tests if any problems develop. You may need tests to make sure you and the baby are doing well.  You may need other tests to make sure you and the baby are doing well. CHANGES DURING THE FIRST TRIMESTER (THE FIRST 3 MONTHS OF PREGNANCY) Your body goes through many changes during pregnancy. They vary from person to person. Talk to your caregiver about changes you notice and are concerned about. Changes can include:  Your menstrual period stops.  The egg and sperm carry the genes that determine what you look like. Genes from you and your partner are forming a baby. The female genes determine whether the baby is a boy or a girl.  Your body increases in girth and you may feel bloated.  Feeling sick to your stomach (nauseous) and throwing up (vomiting). If the vomiting is uncontrollable, call your caregiver.  Your breasts will begin to enlarge and become tender.  Your nipples may stick out more and become darker.  The need to urinate more. Painful urination may mean you have a bladder infection.  Tiring easily.  Loss of appetite.  Cravings for certain kinds of food.  At first, you may gain or lose a couple of pounds.  You may have changes in your emotions from day to day (excited to be pregnant or concerned something may go wrong with the pregnancy and baby).  You may have more vivid and strange dreams. HOME CARE INSTRUCTIONS   It is very important   to avoid all smoking, alcohol and un-prescribed drugs during your pregnancy. These affect the formation and growth of the baby. Avoid chemicals while pregnant to ensure the delivery of a healthy infant.  Start your prenatal visits by the 12th week of pregnancy. They are usually scheduled monthly at first, then more often in the last 2 months before delivery. Keep your caregiver's appointments. Follow your caregiver's instructions regarding medication use, blood and lab tests, exercise, and  diet.  During pregnancy, you are providing food for you and your baby. Eat regular, well-balanced meals. Choose foods such as meat, fish, milk and other low fat dairy products, vegetables, fruits, and whole-grain breads and cereals. Your caregiver will tell you of the ideal weight gain.  You can help morning sickness by keeping soda crackers at the bedside. Eat a couple before arising in the morning. You may want to use the crackers without salt on them.  Eating 4 to 5 small meals rather than 3 large meals a day also may help the nausea and vomiting.  Drinking liquids between meals instead of during meals also seems to help nausea and vomiting.  A physical sexual relationship may be continued throughout pregnancy if there are no other problems. Problems may be early (premature) leaking of amniotic fluid from the membranes, vaginal bleeding, or belly (abdominal) pain.  Exercise regularly if there are no restrictions. Check with your caregiver or physical therapist if you are unsure of the safety of some of your exercises. Greater weight gain will occur in the last 2 trimesters of pregnancy. Exercising will help:  Control your weight.  Keep you in shape.  Prepare you for labor and delivery.  Help you lose your pregnancy weight after you deliver your baby.  Wear a good support or jogging bra for breast tenderness during pregnancy. This may help if worn during sleep too.  Ask when prenatal classes are available. Begin classes when they are offered.  Do not use hot tubs, steam rooms or saunas.  Wear your seat belt when driving. This protects you and your baby if you are in an accident.  Avoid raw meat, uncooked cheese, cat litter boxes and soil used by cats throughout the pregnancy. These carry germs that can cause birth defects in the baby.  The first trimester is a good time to visit your dentist for your dental health. Getting your teeth cleaned is OK. Use a softer toothbrush and brush  gently during pregnancy.  Ask for help if you have financial, counseling or nutritional needs during pregnancy. Your caregiver will be able to offer counseling for these needs as well as refer you for other special needs.  Do not take any medications or herbs unless told by your caregiver.  Inform your caregiver if there is any mental or physical domestic violence.  Make a list of emergency phone numbers of family, friends, hospital, and police and fire departments.  Write down your questions. Take them to your prenatal visit.  Do not douche.  Do not cross your legs.  If you have to stand for long periods of time, rotate you feet or take small steps in a circle.  You may have more vaginal secretions that may require a sanitary pad. Do not use tampons or scented sanitary pads. MEDICATIONS AND DRUG USE IN PREGNANCY  Take prenatal vitamins as directed. The vitamin should contain 1 milligram of folic acid. Keep all vitamins out of reach of children. Only a couple vitamins or tablets containing iron may be   fatal to a baby or young child when ingested.  Avoid use of all medications, including herbs, over-the-counter medications, not prescribed or suggested by your caregiver. Only take over-the-counter or prescription medicines for pain, discomfort, or fever as directed by your caregiver. Do not use aspirin, ibuprofen, or naproxen unless directed by your caregiver.  Let your caregiver also know about herbs you may be using.  Alcohol is related to a number of birth defects. This includes fetal alcohol syndrome. All alcohol, in any form, should be avoided completely. Smoking will cause low birth rate and premature babies.  Street or illegal drugs are very harmful to the baby. They are absolutely forbidden. A baby born to an addicted mother will be addicted at birth. The baby will go through the same withdrawal an adult does.  Let your caregiver know about any medications that you have to take  and for what reason you take them. MISCARRIAGE IS COMMON DURING PREGNANCY A miscarriage does not mean you did something wrong. It is not a reason to worry about getting pregnant again. Your caregiver will help you with questions you may have. If you have a miscarriage, you may need minor surgery. SEEK MEDICAL CARE IF:  You have any concerns or worries during your pregnancy. It is better to call with your questions if you feel they cannot wait, rather than worry about them. SEEK IMMEDIATE MEDICAL CARE IF:   An unexplained oral temperature above 102 F (38.9 C) develops, or as your caregiver suggests.  You have leaking of fluid from the vagina (birth canal). If leaking membranes are suspected, take your temperature and inform your caregiver of this when you call.  There is vaginal spotting or bleeding. Notify your caregiver of the amount and how many pads are used.  You develop a bad smelling vaginal discharge with a change in the color.  You continue to feel sick to your stomach (nauseated) and have no relief from remedies suggested. You vomit blood or coffee ground-like materials.  You lose more than 2 pounds of weight in 1 week.  You gain more than 2 pounds of weight in 1 week and you notice swelling of your face, hands, feet, or legs.  You gain 5 pounds or more in 1 week (even if you do not have swelling of your hands, face, legs, or feet).  You get exposed to German measles and have never had them.  You are exposed to fifth disease or chickenpox.  You develop belly (abdominal) pain. Round ligament discomfort is a common non-cancerous (benign) cause of abdominal pain in pregnancy. Your caregiver still must evaluate this.  You develop headache, fever, diarrhea, pain with urination, or shortness of breath.  You fall or are in a car accident or have any kind of trauma.  There is mental or physical violence in your home. Document Released: 01/01/2001 Document Revised: 04/01/2011  Document Reviewed: 07/05/2008 [employer] Patient Information 2013 [employer].  

## 2012-03-11 LAB — OBSTETRIC PANEL
Antibody Screen: NEGATIVE
Basophils Absolute: 0 10*3/uL (ref 0.0–0.1)
Basophils Relative: 0 % (ref 0–1)
Eosinophils Absolute: 0.1 10*3/uL (ref 0.0–0.7)
Eosinophils Relative: 1 % (ref 0–5)
MCH: 27.5 pg (ref 26.0–34.0)
MCHC: 34.3 g/dL (ref 30.0–36.0)
MCV: 80.2 fL (ref 78.0–100.0)
Monocytes Absolute: 0.8 10*3/uL (ref 0.1–1.0)
Platelets: 370 10*3/uL (ref 150–400)
RDW: 14.1 % (ref 11.5–15.5)
WBC: 11.9 10*3/uL — ABNORMAL HIGH (ref 4.0–10.5)

## 2012-03-11 MED ORDER — PROGESTERONE 200 MG VA SUPP: 200.0000 mg | Freq: Every day | VAGINAL | Status: DC

## 2012-03-11 NOTE — Patient Instructions (Signed)
Return to clinic for any obstetric concerns or go to [facility] for evaluation  

## 2012-03-12 LAB — CULTURE, OB URINE: Colony Count: 45000

## 2012-03-12 LAB — GC/CHLAMYDIA PROBE AMP, URINE: Chlamydia, Swab/Urine, PCR: NEGATIVE

## 2012-03-12 NOTE — Progress Notes (Signed)
This encounter was created in error - please disregard.

## 2012-04-10 ENCOUNTER — Ambulatory Visit (INDEPENDENT_AMBULATORY_CARE_PROVIDER_SITE_OTHER): Payer: BC Managed Care – PPO | Admitting: Advanced Practice Midwife

## 2012-04-10 VITALS — BP 129/82 | Wt 245.0 lb

## 2012-04-10 DIAGNOSIS — Z3481 Encounter for supervision of other normal pregnancy, first trimester: Secondary | ICD-10-CM

## 2012-04-10 DIAGNOSIS — Z3480 Encounter for supervision of other normal pregnancy, unspecified trimester: Secondary | ICD-10-CM

## 2012-04-10 NOTE — Progress Notes (Signed)
p-89  Rash on body at various areas

## 2012-04-10 NOTE — Patient Instructions (Signed)
Pregnancy - Second Trimester The second trimester is the period between 13 to 27 weeks of your pregnancy. It is important to follow your doctor's instructions. HOME CARE   Do not smoke.  Do not drink alcohol or use drugs.  Only take medicine as told by your doctor.  Take prenatal vitamins as told. The vitamin should contain 1 milligram of folic acid.  Exercise.  Eat healthy foods. Eat regular, well-balanced meals.  You can have sex (intercourse) if there are no other problems with the pregnancy.  Do not use hot tubs, steam rooms, or saunas.  Wear a seat belt while driving.  Avoid raw meat, uncooked cheese, and litter boxes and soil used by cats.  Visit your dentist. Cleanings are okay. GET HELP RIGHT AWAY IF:   You have a temperature by mouth above 102 F (38.9 C), not controlled by medicine.  Fluid is coming from your vagina.  Blood is coming from your vagina. Light spotting is common, especially after sex (intercourse).  You have a bad smelling fluid (discharge) coming from the vagina. The fluid changes from clear to white.  You still feel sick to your stomach (nauseous).  You throw up (vomit) blood.  You lose or gain more than 2 pounds (0.9 kilograms) of weight in a week, or as suggested by your doctor.  Your face, hands, feet, or legs get puffy (swell).  You get exposed to German measles and have never had them.  You get exposed to fifth disease or chickenpox.  You have belly (abdominal) pain.  You have a bad headache that will not go away.  You have watery poop (diarrhea), pain when you pee (urinate), or have shortness of breath.  You start to have problems seeing (blurry or double vision).  You fall, are in a car accident, or have any kind of trauma.  There is mental or physical violence at home.  You have any concerns or worries during your pregnancy. MAKE SURE YOU:   Understand these instructions.  Will watch your condition.  Will get help  right away if you are not doing well or get worse. Document Released: 04/03/2009 Document Revised: 04/01/2011 Document Reviewed: 04/03/2009 [employer] Patient Information 2013 [employer].  

## 2012-04-10 NOTE — Progress Notes (Signed)
Well, no c/o, states she "doesn't feel pregnant" so was very reassured to see baby on u/s today. Desires [facility] testing, but wants to wait until medicaid is active. States she is concerned about tearing during delivery - last birth was difficult, had lots of swelling and a 3rd degree lac. Discussed doing prenatal perineal massage at home during third trimester to break up any scar tissue.

## 2012-05-15 ENCOUNTER — Encounter: Payer: Self-pay | Admitting: *Deleted

## 2012-05-15 ENCOUNTER — Ambulatory Visit (INDEPENDENT_AMBULATORY_CARE_PROVIDER_SITE_OTHER): Payer: BC Managed Care – PPO | Admitting: Advanced Practice Midwife

## 2012-05-15 ENCOUNTER — Encounter: Payer: Self-pay | Admitting: Advanced Practice Midwife

## 2012-05-15 VITALS — BP 116/72 | Temp 97.7°F | Wt 249.0 lb

## 2012-05-15 DIAGNOSIS — O09292 Supervision of pregnancy with other poor reproductive or obstetric history, second trimester: Secondary | ICD-10-CM

## 2012-05-15 DIAGNOSIS — O09299 Supervision of pregnancy with other poor reproductive or obstetric history, unspecified trimester: Secondary | ICD-10-CM

## 2012-05-15 MED ORDER — BUTALBITAL-APAP-CAFFEINE 50-325-40 MG PO TABS: 1.0000 | ORAL_TABLET | Freq: Four times a day (QID) | ORAL | Status: DC | PRN

## 2012-05-15 MED ORDER — ONDANSETRON HCL 4 MG PO TABS: 8.0000 mg | ORAL_TABLET | Freq: Three times a day (TID) | ORAL | Status: DC | PRN

## 2012-05-15 NOTE — Progress Notes (Signed)
p-84 

## 2012-05-15 NOTE — Patient Instructions (Signed)
Pregnancy - Second Trimester The second trimester of pregnancy (3 to 6 months) is a period of rapid growth for you and your baby. At the end of the sixth month, your baby is about 9 inches long and weighs 1 1/2 pounds. You will begin to feel the baby move between 18 and 20 weeks of the pregnancy. This is called quickening. Weight gain is faster. A clear fluid (colostrum) may leak out of your breasts. You may feel small contractions of the womb (uterus). This is known as false labor or Braxton-Hicks contractions. This is like a practice for labor when the baby is ready to be born. Usually, the problems with morning sickness have usually passed by the end of your first trimester. Some women develop small dark blotches (called cholasma, mask of pregnancy) on their face that usually goes away after the baby is born. Exposure to the sun makes the blotches worse. Acne may also develop in some pregnant women and pregnant women who have acne, may find that it goes away. PRENATAL EXAMS  Blood work may continue to be done during prenatal exams. These tests are done to check on your health and the probable health of your baby. Blood work is used to follow your blood levels (hemoglobin). Anemia (low hemoglobin) is common during pregnancy. Iron and vitamins are given to help prevent this. You will also be checked for diabetes between 24 and 28 weeks of the pregnancy. Some of the previous blood tests may be repeated.  The size of the uterus is measured during each visit. This is to make sure that the baby is continuing to grow properly according to the dates of the pregnancy.  Your blood pressure is checked every prenatal visit. This is to make sure you are not getting toxemia.  Your urine is checked to make sure you do not have an infection, diabetes or protein in the urine.  Your weight is checked often to make sure gains are happening at the suggested rate. This is to ensure that both you and your baby are growing  normally.  Sometimes, an ultrasound is performed to confirm the proper growth and development of the baby. This is a test which bounces harmless sound waves off the baby so your caregiver can more accurately determine due dates. Sometimes, a specialized test is done on the amniotic fluid surrounding the baby. This test is called an amniocentesis. The amniotic fluid is obtained by sticking a needle into the belly (abdomen). This is done to check the chromosomes in instances where there is a concern about possible genetic problems with the baby. It is also sometimes done near the end of pregnancy if an early delivery is required. In this case, it is done to help make sure the baby's lungs are mature enough for the baby to live outside of the womb. CHANGES OCCURING IN THE SECOND TRIMESTER OF PREGNANCY Your body goes through many changes during pregnancy. They vary from person to person. Talk to your caregiver about changes you notice that you are concerned about.  During the second trimester, you will likely have an increase in your appetite. It is normal to have cravings for certain foods. This varies from person to person and pregnancy to pregnancy.  Your lower abdomen will begin to bulge.  You may have to urinate more often because the uterus and baby are pressing on your bladder. It is also common to get more bladder infections during pregnancy (pain with urination). You can help this by   drinking lots of fluids and emptying your bladder before and after intercourse.  You may begin to get stretch marks on your hips, abdomen, and breasts. These are normal changes in the body during pregnancy. There are no exercises or medications to take that prevent this change.  You may begin to develop swollen and bulging veins (varicose veins) in your legs. Wearing support hose, elevating your feet for 15 minutes, 3 to 4 times a day and limiting salt in your diet helps lessen the problem.  Heartburn may develop  as the uterus grows and pushes up against the stomach. Antacids recommended by your caregiver helps with this problem. Also, eating smaller meals 4 to 5 times a day helps.  Constipation can be treated with a stool softener or adding bulk to your diet. Drinking lots of fluids, vegetables, fruits, and whole grains are helpful.  Exercising is also helpful. If you have been very active up until your pregnancy, most of these activities can be continued during your pregnancy. If you have been less active, it is helpful to start an exercise program such as walking.  Hemorrhoids (varicose veins in the rectum) may develop at the end of the second trimester. Warm sitz baths and hemorrhoid cream recommended by your caregiver helps hemorrhoid problems.  Backaches may develop during this time of your pregnancy. Avoid heavy lifting, wear low heal shoes and practice good posture to help with backache problems.  Some pregnant women develop tingling and numbness of their hand and fingers because of swelling and tightening of ligaments in the wrist (carpel tunnel syndrome). This goes away after the baby is born.  As your breasts enlarge, you may have to get a bigger bra. Get a comfortable, cotton, support bra. Do not get a nursing bra until the last month of the pregnancy if you will be nursing the baby.  You may get a dark line from your belly button to the pubic area called the linea nigra.  You may develop rosy cheeks because of increase blood flow to the face.  You may develop spider looking lines of the face, neck, arms and chest. These go away after the baby is born. HOME CARE INSTRUCTIONS   It is extremely important to avoid all smoking, herbs, alcohol, and unprescribed drugs during your pregnancy. These chemicals affect the formation and growth of the baby. Avoid these chemicals throughout the pregnancy to ensure the delivery of a healthy infant.  Most of your home care instructions are the same as  suggested for the first trimester of your pregnancy. Keep your caregiver's appointments. Follow your caregiver's instructions regarding medication use, exercise and diet.  During pregnancy, you are providing food for you and your baby. Continue to eat regular, well-balanced meals. Choose foods such as meat, fish, milk and other low fat dairy products, vegetables, fruits, and whole-grain breads and cereals. Your caregiver will tell you of the ideal weight gain.  A physical sexual relationship may be continued up until near the end of pregnancy if there are no other problems. Problems could include early (premature) leaking of amniotic fluid from the membranes, vaginal bleeding, abdominal pain, or other medical or pregnancy problems.  Exercise regularly if there are no restrictions. Check with your caregiver if you are unsure of the safety of some of your exercises. The greatest weight gain will occur in the last 2 trimesters of pregnancy. Exercise will help you:  Control your weight.  Get you in shape for labor and delivery.  Lose weight   after you have the baby.  Wear a good support or jogging bra for breast tenderness during pregnancy. This may help if worn during sleep. Pads or tissues may be used in the bra if you are leaking colostrum.  Do not use hot tubs, steam rooms or saunas throughout the pregnancy.  Wear your seat belt at all times when driving. This protects you and your baby if you are in an accident.  Avoid raw meat, uncooked cheese, cat litter boxes and soil used by cats. These carry germs that can cause birth defects in the baby.  The second trimester is also a good time to visit your dentist for your dental health if this has not been done yet. Getting your teeth cleaned is OK. Use a soft toothbrush. Brush gently during pregnancy.  It is easier to loose urine during pregnancy. Tightening up and strengthening the pelvic muscles will help with this problem. Practice stopping your  urination while you are going to the bathroom. These are the same muscles you need to strengthen. It is also the muscles you would use as if you were trying to stop from passing gas. You can practice tightening these muscles up 10 times a set and repeating this about 3 times per day. Once you know what muscles to tighten up, do not perform these exercises during urination. It is more likely to contribute to an infection by backing up the urine.  Ask for help if you have financial, counseling or nutritional needs during pregnancy. Your caregiver will be able to offer counseling for these needs as well as refer you for other special needs.  Your skin may become oily. If so, wash your face with mild soap, use non-greasy moisturizer and oil or cream based makeup. MEDICATIONS AND DRUG USE IN PREGNANCY  Take prenatal vitamins as directed. The vitamin should contain 1 milligram of folic acid. Keep all vitamins out of reach of children. Only a couple vitamins or tablets containing iron may be fatal to a baby or young child when ingested.  Avoid use of all medications, including herbs, over-the-counter medications, not prescribed or suggested by your caregiver. Only take over-the-counter or prescription medicines for pain, discomfort, or fever as directed by your caregiver. Do not use aspirin.  Let your caregiver also know about herbs you may be using.  Alcohol is related to a number of birth defects. This includes fetal alcohol syndrome. All alcohol, in any form, should be avoided completely. Smoking will cause low birth rate and premature babies.  Street or illegal drugs are very harmful to the baby. They are absolutely forbidden. A baby born to an addicted mother will be addicted at birth. The baby will go through the same withdrawal an adult does. SEEK MEDICAL CARE IF:  You have any concerns or worries during your pregnancy. It is better to call with your questions if you feel they cannot wait, rather  than worry about them. SEEK IMMEDIATE MEDICAL CARE IF:   An unexplained oral temperature above 102 F (38.9 C) develops, or as your caregiver suggests.  You have leaking of fluid from the vagina (birth canal). If leaking membranes are suspected, take your temperature and tell your caregiver of this when you call.  There is vaginal spotting, bleeding, or passing clots. Tell your caregiver of the amount and how many pads are used. Light spotting in pregnancy is common, especially following intercourse.  You develop a bad smelling vaginal discharge with a change in the color from clear   to white.  You continue to feel sick to your stomach (nauseated) and have no relief from remedies suggested. You vomit blood or coffee ground-like materials.  You lose more than 2 pounds of weight or gain more than 2 pounds of weight over 1 week, or as suggested by your caregiver.  You notice swelling of your face, hands, feet, or legs.  You get exposed to German measles and have never had them.  You are exposed to fifth disease or chickenpox.  You develop belly (abdominal) pain. Round ligament discomfort is a common non-cancerous (benign) cause of abdominal pain in pregnancy. Your caregiver still must evaluate you.  You develop a bad headache that does not go away.  You develop fever, diarrhea, pain with urination, or shortness of breath.  You develop visual problems, blurry, or double vision.  You fall or are in a car accident or any kind of trauma.  There is mental or physical violence at home. Document Released: 01/01/2001 Document Revised: 04/01/2011 Document Reviewed: 07/06/2008 [employer] Patient Information 2013 [employer].  

## 2012-05-15 NOTE — Progress Notes (Signed)
Wanted more discussion about perineal laceration from last delivery. My records indicate a 2nd degree, but she says I told her it was 3rd.  Baby was OP.  States had looseness and urinary incontinence for a few months but never saw Dr Marice Potter because it got better. I told her it may have been related to hypoestrogenism with breastfeeding/anovulation.  She agrees that when her cycles came back it got better. Also concerned about headaches, wants new Rx for Fioricet.  Will order fetal echo for hx of previous child with VSD. Wants to wait on [facility] until medicaid kicks in. Still has nausea, but phenergan makes her sleepy. Will try Zofran.

## 2012-05-22 ENCOUNTER — Encounter (HOSPITAL_COMMUNITY): Payer: Self-pay | Admitting: Family Medicine

## 2012-05-27 ENCOUNTER — Other Ambulatory Visit: Payer: Self-pay | Admitting: Advanced Practice Midwife

## 2012-05-27 DIAGNOSIS — O09299 Supervision of pregnancy with other poor reproductive or obstetric history, unspecified trimester: Secondary | ICD-10-CM

## 2012-05-29 ENCOUNTER — Encounter: Payer: Self-pay | Admitting: Advanced Practice Midwife

## 2012-05-29 ENCOUNTER — Ambulatory Visit (HOSPITAL_COMMUNITY)
Admission: RE | Admit: 2012-05-29 | Discharge: 2012-05-29 | Disposition: A | Discharge status: Home / Self Care | Payer: BC Managed Care – PPO | Source: Ambulatory Visit | Attending: Advanced Practice Midwife | Admitting: Advanced Practice Midwife

## 2012-05-29 DIAGNOSIS — O99210 Obesity complicating pregnancy, unspecified trimester: Secondary | ICD-10-CM | POA: Insufficient documentation

## 2012-05-29 DIAGNOSIS — O2622 Pregnancy care for patient with recurrent pregnancy loss, second trimester: Secondary | ICD-10-CM

## 2012-05-29 DIAGNOSIS — O10019 Pre-existing essential hypertension complicating pregnancy, unspecified trimester: Secondary | ICD-10-CM | POA: Insufficient documentation

## 2012-05-29 DIAGNOSIS — O34219 Maternal care for unspecified type scar from previous cesarean delivery: Secondary | ICD-10-CM | POA: Insufficient documentation

## 2012-05-29 DIAGNOSIS — O09299 Supervision of pregnancy with other poor reproductive or obstetric history, unspecified trimester: Secondary | ICD-10-CM

## 2012-05-29 DIAGNOSIS — O30109 Triplet pregnancy, unspecified number of placenta and unspecified number of amniotic sacs, unspecified trimester: Secondary | ICD-10-CM | POA: Insufficient documentation

## 2012-05-29 DIAGNOSIS — E669 Obesity, unspecified: Secondary | ICD-10-CM | POA: Insufficient documentation

## 2012-05-29 DIAGNOSIS — O09292 Supervision of pregnancy with other poor reproductive or obstetric history, second trimester: Secondary | ICD-10-CM

## 2012-05-29 NOTE — Consult Note (Signed)
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  She has previously undergone extensive Genetic testing and counseling due to her translocation and is well aware of potential risks.  She is without complaints today.  She previously declined first trimester screening.  Her OB history is as described below.  OB History: OB History   Grav Para Term Preterm Abortions TAB SAB Ect Mult Living   16 6 5 1 9 1 8  0 0 6     5- term SVDs without complications 1- delivery complicated by blood pressure issues.  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  Followed by Peds Genetics at [facility] 9- previous losses - mostly in the 8-12 week time frame, but had a 14 week loss associated with vanishing twins and an 57 week stillbirth  PMH:  Past Medical History  Diagnosis Date  . Miscarriage     x 8  . ADHD (attention deficit hyperactivity disorder)     husband  . Chromosomal abnormality   . HSV (herpes simplex virus) anogenital infection   . Kidney stone on left side     PSH:  Past Surgical History  Procedure Laterality Date  . Dilation and curettage of uterus  1998  . Arm surgery for fracture    . Knee surgery    . Wisdom tooth extraction     Meds: Fioricet for migraine headaches, PNV  Allergies: NKDA  FH: as above.  Adopted - little known about family.  Soc: quit smoking 9 years ago, denies ETOH use during pregnancy  Review of Systems: no vaginal bleeding or cramping/contractions, no LOF, no nausea/vomiting. All other systems reviewed and are negative.   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  In approximately 01/998 individuals a Robertsonian translocation occurs that is associated with 45 total chromosomes but no net loss or gain of genetic material (balanced).  Unfortunately, their offspring frequently have monosomic or trisomic karyotypes which frequently results in multiple / early losses.  [name] has previously been counseled about her potential risks of fetal loss or potential for Trisomy 38.  Ultrasound today is very reassuring without evidence of fetal anomalies.  The patient is not interested in amniocentesis, but expressed some desire for cell free fetal DNA.  Would recommend formal genetic counseling (scheduled) to discuss further - I am not certain whether or not this abnormality is detectable by cell free fetal DNA.         4) Previous child with heart defect (VSD) - fetal echo with Peds cardiology scheduled.  Screening exam is within normal limits.         5) Previous preterm delivery at 21  weeks - child with autism, pancreatic failure felt to be do to genetic syndrome.  Recommend: 1) Genetic counseling (scheduled) to discuss the issues described above 2) Fetal echo 3) Follow up ultrasound in 4 weeks for interval growth and to reevaluate fetal anatomy.    Thank you for the opportunity to be a part of the care of [name]. Please contact our office if we can be of further assistance.   I spent approximately 30 minutes with this patient with over 50% of time spent in face-to-face counseling.  Alpha Gula, MD Maternal-Fetal Medicine

## 2012-06-12 ENCOUNTER — Ambulatory Visit (INDEPENDENT_AMBULATORY_CARE_PROVIDER_SITE_OTHER): Payer: BC Managed Care – PPO | Admitting: Family

## 2012-06-12 ENCOUNTER — Encounter: Payer: Self-pay | Admitting: Family

## 2012-06-12 VITALS — BP 124/77 | Wt 252.0 lb

## 2012-06-12 DIAGNOSIS — R42 Dizziness and giddiness: Secondary | ICD-10-CM

## 2012-06-12 DIAGNOSIS — Z3480 Encounter for supervision of other normal pregnancy, unspecified trimester: Secondary | ICD-10-CM

## 2012-06-12 DIAGNOSIS — R21 Rash and other nonspecific skin eruption: Secondary | ICD-10-CM

## 2012-06-12 LAB — CBC
HCT: 35.3 % — ABNORMAL LOW (ref 36.0–46.0)
Hemoglobin: 11.6 g/dL — ABNORMAL LOW (ref 12.0–15.0)
WBC: 9.4 10*3/uL (ref 4.0–10.5)

## 2012-06-12 LAB — GLUCOSE, POCT (MANUAL RESULT ENTRY): POC Glucose: 80 mg/dl (ref 70–99)

## 2012-06-12 NOTE — Progress Notes (Signed)
Feels dizzy; doing low glycemic diet in pregnancy; CBG 88  ; check hemoglobin ; repeat ultrasound scheduled for mid-June; also has echo scheduled next week.  Rash on legs/breast/chin that started four months ago > refer to dermatologist for eval.  Recommended increasing carbs with meals.

## 2012-06-12 NOTE — Progress Notes (Signed)
p-81  Rash on legs chin and breast

## 2012-06-17 ENCOUNTER — Telehealth: Payer: Self-pay

## 2012-06-17 NOTE — Telephone Encounter (Signed)
Appt. Made for [facility] office on June 13 at 10:30 faxed notes to [phone]

## 2012-07-07 ENCOUNTER — Other Ambulatory Visit: Payer: Self-pay | Admitting: Family

## 2012-07-07 DIAGNOSIS — O358XX9 Maternal care for other (suspected) fetal abnormality and damage, other fetus: Secondary | ICD-10-CM

## 2012-07-07 DIAGNOSIS — O09529 Supervision of elderly multigravida, unspecified trimester: Secondary | ICD-10-CM

## 2012-07-10 ENCOUNTER — Encounter: Payer: Self-pay | Admitting: Advanced Practice Midwife

## 2012-07-10 ENCOUNTER — Other Ambulatory Visit: Payer: Self-pay

## 2012-07-10 ENCOUNTER — Ambulatory Visit (INDEPENDENT_AMBULATORY_CARE_PROVIDER_SITE_OTHER): Payer: BC Managed Care – PPO | Admitting: Advanced Practice Midwife

## 2012-07-10 ENCOUNTER — Encounter: Payer: Self-pay | Admitting: Family

## 2012-07-10 ENCOUNTER — Ambulatory Visit (HOSPITAL_COMMUNITY)
Admission: RE | Admit: 2012-07-10 | Discharge: 2012-07-10 | Disposition: A | Discharge status: Home / Self Care | Payer: Medicaid Other | Source: Ambulatory Visit | Attending: Obstetrics & Gynecology | Admitting: Obstetrics & Gynecology

## 2012-07-10 ENCOUNTER — Ambulatory Visit (HOSPITAL_COMMUNITY)
Admission: RE | Admit: 2012-07-10 | Discharge: 2012-07-10 | Disposition: A | Discharge status: Home / Self Care | Payer: Medicaid Other | Source: Ambulatory Visit | Attending: Family | Admitting: Family

## 2012-07-10 VITALS — BP 127/77 | Wt 254.0 lb

## 2012-07-10 DIAGNOSIS — O352XX0 Maternal care for (suspected) hereditary disease in fetus, not applicable or unspecified: Secondary | ICD-10-CM | POA: Insufficient documentation

## 2012-07-10 DIAGNOSIS — O09529 Supervision of elderly multigravida, unspecified trimester: Secondary | ICD-10-CM | POA: Insufficient documentation

## 2012-07-10 DIAGNOSIS — O358XX9 Maternal care for other (suspected) fetal abnormality and damage, other fetus: Secondary | ICD-10-CM

## 2012-07-10 DIAGNOSIS — Z3482 Encounter for supervision of other normal pregnancy, second trimester: Secondary | ICD-10-CM

## 2012-07-10 DIAGNOSIS — Z3480 Encounter for supervision of other normal pregnancy, unspecified trimester: Secondary | ICD-10-CM

## 2012-07-10 DIAGNOSIS — O09299 Supervision of pregnancy with other poor reproductive or obstetric history, unspecified trimester: Secondary | ICD-10-CM | POA: Insufficient documentation

## 2012-07-10 DIAGNOSIS — O2620 Pregnancy care for patient with recurrent pregnancy loss, unspecified trimester: Secondary | ICD-10-CM | POA: Insufficient documentation

## 2012-07-10 DIAGNOSIS — Z8751 Personal history of pre-term labor: Secondary | ICD-10-CM | POA: Insufficient documentation

## 2012-07-10 DIAGNOSIS — Z3689 Encounter for other specified antenatal screening: Secondary | ICD-10-CM | POA: Insufficient documentation

## 2012-07-10 MED ORDER — ACCU-CHEK FASTCLIX LANCETS MISC: 1.0000 | Freq: Four times a day (QID) | Status: DC

## 2012-07-10 MED ORDER — GLUCOSE BLOOD VI STRP: ORAL_STRIP | Status: DC

## 2012-07-10 MED ORDER — ACCU-CHEK NANO SMARTVIEW W/DEVICE KIT: 1.0000 | PACK | Freq: Four times a day (QID) | Status: DC

## 2012-07-10 NOTE — Progress Notes (Signed)
Well, increased carbs in diet and feeling better. Has u/s today, had to reschedule echo. Pt requests not to do 1 hour GCT, would rather check sugars at home. Has never failed the 1 hour and did not tolerate glucola well in last pregnancy. Will check fasting CBGs at home and some 2 hours and bring numbers at next visit.

## 2012-07-10 NOTE — Progress Notes (Signed)
p-80 

## 2012-07-10 NOTE — Patient Instructions (Signed)
Pregnancy - Second Trimester The second trimester is the period between 13 to 27 weeks of your pregnancy. It is important to follow your doctor's instructions. HOME CARE   Do not smoke.  Do not drink alcohol or use drugs.  Only take medicine as told by your doctor.  Take prenatal vitamins as told. The vitamin should contain 1 milligram of folic acid.  Exercise.  Eat healthy foods. Eat regular, well-balanced meals.  You can have sex (intercourse) if there are no other problems with the pregnancy.  Do not use hot tubs, steam rooms, or saunas.  Wear a seat belt while driving.  Avoid raw meat, uncooked cheese, and litter boxes and soil used by cats.  Visit your dentist. Cleanings are okay. GET HELP RIGHT AWAY IF:   You have a temperature by mouth above 102 F (38.9 C), not controlled by medicine.  Fluid is coming from your vagina.  Blood is coming from your vagina. Light spotting is common, especially after sex (intercourse).  You have a bad smelling fluid (discharge) coming from the vagina. The fluid changes from clear to white.  You still feel sick to your stomach (nauseous).  You throw up (vomit) blood.  You lose or gain more than 2 pounds (0.9 kilograms) of weight in a week, or as suggested by your doctor.  Your face, hands, feet, or legs get puffy (swell).  You get exposed to German measles and have never had them.  You get exposed to fifth disease or chickenpox.  You have belly (abdominal) pain.  You have a bad headache that will not go away.  You have watery poop (diarrhea), pain when you pee (urinate), or have shortness of breath.  You start to have problems seeing (blurry or double vision).  You fall, are in a car accident, or have any kind of trauma.  There is mental or physical violence at home.  You have any concerns or worries during your pregnancy. MAKE SURE YOU:   Understand these instructions.  Will watch your condition.  Will get help  right away if you are not doing well or get worse. Document Released: 04/03/2009 Document Revised: 04/01/2011 Document Reviewed: 04/03/2009 [employer] Patient Information 2014 [employer].  

## 2012-07-13 NOTE — Progress Notes (Signed)
Genetic Counseling  High-Risk Gestation Note  Appointment Date:  07/10/2012 Referred By: Melissa Noon, CNM Date of Birth:  04/13/1975 Partner:  Renetta Chalk    Pregnancy History: [id number] Estimated Date of Delivery: 10/22/12 Estimated Gestational Age: [redacted]w[redacted]d Attending: Damaris Hippo, MD   [name] and her husband, Mr. Milanya Sunderland, were seen for genetic counseling because of a maternal age of 37 y.o. 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 [name] was previously seen for genetic counseling in 2009 regarding the translocation. See previous genetic counseling note for additional detailed discussion.   They were counseled regarding maternal age and the association with risk for chromosome conditions due to nondisjunction with aging of the ova.   We reviewed chromosomes, nondisjunction, and the associated 1 in 141 risk for fetal aneuploidy related to a maternal age of 37 y.o. at [redacted]w[redacted]d gestation.  They were counseled that the risk for aneuploidy decreases as gestational age increases, accounting for those pregnancies which spontaneously abort.  We specifically discussed Down syndrome (trisomy 74), trisomies 36 and 106, and sex chromosome aneuploidies (47,XXX and 47,XXY) including the common features and prognoses of each.   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 The following is an excerpt from the patient's 2009 genetic counseling visit and detailed discussion: "This particular translocation is the most common Robertsonian translocation, and Robertsonian translocations involve breakage of two acrocentric chromosomes at the centromeres. The two long arms of each chromosome combine, producing a derivative chromosome. The loss of the two p or short arms, do not cause clinical significance since this region consists of repeated satellite  material. Therefore, balanced Robertsonian translocation carriers typically do not have medical issues related to their chromosome rearrangement. Balanced Robertsonian translocation carriers do, however have an increased risk for reproductive problems." We reviewed with the couple today that during maternal meiosis, six different gametes can be formed; three of these gametes (nullisomic for 68, nullisomic for 14, and disomic for 14) are unstable, typically resulting in very early pregnancy loss. The remaining three gametes include normal chromosome, balanced derivative chromosome, and disomic for 13. Nullisomic gametes when combined with a normal gamete will result in monosomic conception, while a disomic gamete when combined with a normal gamete will result in a trisomic conception. [name] understands that her chromosome rearrangement increases her likelihood to have a pregnancy with translocation trisomy 64. We reviewed that this risk is estimated to be less than 1% during the second trimester. In addition, there is also an increased chance to have a conception with uniparental disomy (UPD) of chromosome 14 due to trisomic/monosomic rescue. Maternal UPD 14 is associated with prenatal and postnatal growth retardation, characteristic facial features, and normal to delayed development. The theoretical risk for UPD 14 is estimated to be less than 1%. Lastly, the risk for spontaneous abortion may be increased.   We reviewed available screening options including noninvasive prenatal screening (NIPS)/cell free fetal DNA (cffDNA) testing and detailed ultrasound. They were counseled that screening tests are used to modify a patient's a priori risk for aneuploidy, typically based on age. This estimate provides a pregnancy specific risk assessment. We reviewed the benefits and limitations of each option. Specifically, we discussed the conditions for which each test screens, the detection rates, and false positive rates  of each. We reviewed that NIPS would not provide information regarding the presence of a translocation, given that it  does not assess configuration of the chromosomes. Additionally, NIPS would not assess for UPD 14.  They were also counseled regarding diagnostic testing via amniocentesis. We reviewed the approximate 1 in 300-500 risk for complications for amniocentesis, including spontaneous pregnancy loss or preterm labor and delivery at this gestational age.  The patient also expressed interest in having a detailed ultrasound.  A complete ultrasound was performed today. An echogenic intracardiac focus (EIF) was visualized at this time. The ultrasound report will be sent under separate cover. There were no visualized fetal anomalies or additional markers suggestive of aneuploidy. An isolated echogenic focus is generally believed to be a normal variation without any concerns for the pregnancy.  Isolated echogenic cardiac foci are not associated with congenital heart defects in the baby or compromised cardiac function after birth.  However, an echogenic cardiac focus is associated with a slightly increased chance for Down syndrome in the pregnancy. Thus, the presence of an EIF would increase the risk for Down syndrome above the patient's age related risk to approximately 1 in 67.   Diagnostic testing via amniocentesis was declined today.  They understand that screening tests cannot rule out all birth defects or genetic syndromes. The patient was advised of this limitation.  After consideration of all the options, they elected to proceed with NIPS (Panorama), which was drawn at the time of today's visit.  Those results will be available in approximately 8-10 days.    Both family histories were updated from the couple's genetic counseling visit in 2009 and found to be noncontributory for update regarding their 60 year-old daughter with ventricular septal defect and moderate to severe autism. Her VSD was  reportedly diagnosed at age 54 year. She reportedly has normal chromosomes; she does not have the 13;14 Robertsonian translocation, and she reportedly has had normal Fragile X studies. A specific underlying cause for her features has currently not been identified. Congenital heart defects occur in approximately 1% of pregnancies.  Congenital heart defects may occur due to multifactorial influences, chromosomal abnormalities, genetic syndromes or environmental exposures.  Isolated heart defects are generally multifactorial. In the case of an isolated CHD for full sibling to the pregnancy and assuming multifactorial inheritance, the risk for a congenital heart defect in the current pregnancy would be approximately 1-2%. Fetal echocardiogram is available during the pregnancy to assess fetal heart in more detail, if desired. Mrs. Lares previously had fetal echo scheduled in the pregnancy but elected to cancel this appointment due to insurance concerns. They may consider rescheduling this appointment in the future.   We discussed that autism is part of the spectrum of conditions referred to as Autistic spectrum disorders (ASD). We discussed that ASDs are among the most common neurodevelopmental disorders, with approximately 1 in 88 children meeting criteria for ASD. Approximately 80% of individuals diagnosed are female. There is strong evidence that genetic factors play a critical role in development of ASD. There have been recent advances in identifying specific genetic causes of ASD, however, there are still many individuals for whom the etiology of the ASD is not known. Once a family has a child with a diagnosis of ASD, there is a 13.5% chance to have another child with ASD. If the pregnancy is female the chance is approximately 9%, and approximately 26% if the pregnancy is female. When there is more than one affected sibling, the recurrence chance is 32%. They understand that at this time there is not genetic testing  available for ASD for most families. Without further  information regarding the provided family history, an accurate genetic risk cannot be calculated. Further genetic counseling is warranted if more information is obtained. Please see previous genetic counseling note from 2009 for additional detailed family history discussion.   Mrs. Teti denied exposure to environmental toxins or chemical agents. She denied the use of alcohol, tobacco or street drugs. She denied significant viral illnesses during the course of her pregnancy. Her medical and surgical histories were noncontributory.   I counseled this couple regarding the above risks and available options.  The approximate face-to-face time with the genetic counselor was 30 minutes.  Quinn Plowman, MS,  Certified [employer] 07/13/2012

## 2012-07-17 DIAGNOSIS — Z8279 Family history of other congenital malformations, deformations and chromosomal abnormalities: Secondary | ICD-10-CM | POA: Insufficient documentation

## 2012-07-21 ENCOUNTER — Telehealth (HOSPITAL_COMMUNITY): Payer: Self-pay | Admitting: MS"

## 2012-07-21 NOTE — Telephone Encounter (Signed)
Called [name] to discuss her cell free fetal DNA test results.  Mrs. [name] had Panorama testing through [facility].  Testing was offered because of advanced maternal age and given that the patient is a balanced 13;14 translocation carrier.   The patient was identified by name and DOB.  We reviewed that these are within normal limits, showing a less than 1 in 10,000 risk for trisomies 21, 18 and 13, and monosomy X (Turner syndrome).  In addition, the risk for triploidy/vanishing twin and sex chromosome trisomies (47,XXX and 47,XXY) was also low risk.  We reviewed that this testing identifies > 99% of pregnancies with trisomy 25, trisomy 58, trisomy 58, sex chromosome trisomies (47,XXX and 47,XXY), and triploidy.  The detection rate for monosomy X is ~92%.  The false positive rate is <0.1% for all conditions. Testing was also consistent with female/ female gender.  The patient did wish to know gender.  She understands that this testing does not identify all genetic conditions.  All questions were answered to her satisfaction, she was encouraged to call with additional questions or concerns.  Quinn Plowman, MS Certified Genetic Counselor 07/21/2012 2:46 PM

## 2012-08-04 ENCOUNTER — Other Ambulatory Visit: Payer: Self-pay | Admitting: Family Medicine

## 2012-08-04 DIAGNOSIS — O352XX1 Maternal care for (suspected) hereditary disease in fetus, fetus 1: Secondary | ICD-10-CM

## 2012-08-06 ENCOUNTER — Inpatient Hospital Stay (HOSPITAL_COMMUNITY): Payer: BC Managed Care – PPO

## 2012-08-06 ENCOUNTER — Encounter (HOSPITAL_COMMUNITY): Payer: Self-pay | Admitting: *Deleted

## 2012-08-06 ENCOUNTER — Inpatient Hospital Stay (HOSPITAL_COMMUNITY)
Admission: AD | Admit: 2012-08-06 | Discharge: 2012-08-06 | Disposition: A | Discharge status: Home / Self Care | Payer: BC Managed Care – PPO | Source: Ambulatory Visit | Attending: Obstetrics & Gynecology | Admitting: Obstetrics & Gynecology

## 2012-08-06 DIAGNOSIS — O2343 Unspecified infection of urinary tract in pregnancy, third trimester: Secondary | ICD-10-CM

## 2012-08-06 DIAGNOSIS — Z87442 Personal history of urinary calculi: Secondary | ICD-10-CM | POA: Insufficient documentation

## 2012-08-06 DIAGNOSIS — O99891 Other specified diseases and conditions complicating pregnancy: Secondary | ICD-10-CM | POA: Insufficient documentation

## 2012-08-06 DIAGNOSIS — R109 Unspecified abdominal pain: Secondary | ICD-10-CM | POA: Insufficient documentation

## 2012-08-06 DIAGNOSIS — R319 Hematuria, unspecified: Secondary | ICD-10-CM | POA: Insufficient documentation

## 2012-08-06 DIAGNOSIS — O09292 Supervision of pregnancy with other poor reproductive or obstetric history, second trimester: Secondary | ICD-10-CM

## 2012-08-06 DIAGNOSIS — R300 Dysuria: Secondary | ICD-10-CM | POA: Insufficient documentation

## 2012-08-06 DIAGNOSIS — Z3481 Encounter for supervision of other normal pregnancy, first trimester: Secondary | ICD-10-CM

## 2012-08-06 LAB — URINE MICROSCOPIC-ADD ON

## 2012-08-06 LAB — URINALYSIS, ROUTINE W REFLEX MICROSCOPIC
Glucose, UA: NEGATIVE mg/dL
Protein, ur: NEGATIVE mg/dL
Specific Gravity, Urine: 1.01 (ref 1.005–1.030)
Urobilinogen, UA: 0.2 mg/dL (ref 0.0–1.0)

## 2012-08-06 MED ORDER — TRAMADOL HCL 50 MG PO TABS: 50.0000 mg | ORAL_TABLET | Freq: Three times a day (TID) | ORAL | Status: DC | PRN

## 2012-08-06 MED ORDER — CEPHALEXIN 500 MG PO CAPS: 500.0000 mg | ORAL_CAPSULE | Freq: Four times a day (QID) | ORAL | Status: DC

## 2012-08-06 NOTE — MAU Note (Signed)
Yesterday thought was getting UTI, hx of UTI and kidney stones.  Urgency w/ small amts, doesn't feel like emptying bladder, some pain associated with urination.  No fever.  This morning noted blood and having flank pain.

## 2012-08-06 NOTE — MAU Provider Note (Signed)
History     CSN: [id number]  Arrival date and time: 08/06/12 0902   None     No chief complaint on file.  HPI This is a 37 y.o. female at [redacted]w[redacted]d who presents with c/o urgency, dysuria and one episode of blood in urine. Has some left flank pain. Has a history of 3-4 mm stones and thinks this may be one. Declines analgesic now.  RN Note: Burgess Estelle thought was getting UTI, hx of UTI and kidney stones. Urgency w/ small amts, doesn't feel like emptying bladder, some pain associated with urination. No fever. This morning noted blood and having flank pain  OB History   Grav Para Term Preterm Abortions TAB SAB Ect Mult Living   16 6 5 1 9 1 8  0 0 6      Past Medical History  Diagnosis Date  . Miscarriage     x 8  . ADHD (attention deficit hyperactivity disorder)     husband  . Chromosomal abnormality   . HSV (herpes simplex virus) anogenital infection   . Kidney stone on left side     Past Surgical History  Procedure Laterality Date  . Dilation and curettage of uterus  1998  . Arm surgery for fracture    . Knee surgery    . Wisdom tooth extraction      Family History  Problem Relation Age of Onset  . Autism spectrum disorder Daughter   . Other Daughter     Pancreatic insufficiency.     History  Substance Use Topics  . Smoking status: Former Games developer  . Smokeless tobacco: Not on file  . Alcohol Use: No    Allergies: No Known Allergies  Prescriptions prior to admission  Medication Sig Dispense Refill  . acetaminophen (TYLENOL) 325 MG tablet Take by mouth every 8 (eight) hours as needed for pain.      . Multiple Vitamins-Minerals (MULTIVITAMIN WITH MINERALS) tablet Take 1 tablet by mouth daily.      . pseudoephedrine (SUDAFED) 30 MG tablet Take 60 mg by mouth every 6 (six) hours as needed for congestion.      Marland Kitchen ACCU-CHEK FASTCLIX LANCETS MISC 1 Device by Does not apply route 4 (four) times daily.  120 each  3  . Blood Glucose Monitoring Suppl (ACCU-CHEK NANO  SMARTVIEW) W/DEVICE KIT 1 Device by Does not apply route 4 (four) times daily.  1 kit  0  . glucose blood test strip Use as instructed  100 each  12    Review of Systems  Constitutional: Negative for fever, chills and malaise/fatigue.  Gastrointestinal: Negative for nausea, vomiting, abdominal pain, diarrhea and constipation.  Genitourinary: Positive for dysuria, urgency, hematuria and flank pain.  Musculoskeletal: Negative for myalgias.  Neurological: Negative for dizziness.   Physical Exam   Blood pressure 128/72, pulse 98, temperature 98.7 F (37.1 C), temperature source Oral, resp. rate 18, weight 117.028 kg (258 lb), last menstrual period 01/16/2012.  Physical Exam  Constitutional: She is oriented to person, place, and time. She appears well-developed and well-nourished. No distress.  Cardiovascular: Normal rate.   Respiratory: Effort normal.  GI: Soft. She exhibits no distension. There is no tenderness. There is no rebound and no guarding.  Musculoskeletal: Normal range of motion.  Neurological: She is alert and oriented to person, place, and time.  Skin: Skin is warm and dry.  Psychiatric: She has a normal mood and affect.   Fetal heart rate reactive, no decels No contractions  MAU Course  Procedures  MDM Will culture urine and do renal ultrasound.   Assessment and Plan  Keflex 500mg  QID for 10 days Tramadol 50mg  q 8 prn #30  [facility] 08/06/2012, 10:29 AM

## 2012-08-06 NOTE — MAU Provider Note (Signed)
Attestation of Attending Supervision of Advanced Practitioner (CNM/NP): Evaluation and management procedures were performed by the Advanced Practitioner under my supervision and collaboration. I have reviewed the Advanced Practitioner's note and chart, and I agree with the management and plan.  [name] 1:29 PM

## 2012-08-07 ENCOUNTER — Ambulatory Visit (INDEPENDENT_AMBULATORY_CARE_PROVIDER_SITE_OTHER): Payer: BC Managed Care – PPO | Admitting: Family

## 2012-08-07 ENCOUNTER — Ambulatory Visit (HOSPITAL_COMMUNITY): Payer: BC Managed Care – PPO

## 2012-08-07 ENCOUNTER — Ambulatory Visit (HOSPITAL_COMMUNITY)
Admission: RE | Admit: 2012-08-07 | Discharge: 2012-08-07 | Disposition: A | Discharge status: Home / Self Care | Payer: BC Managed Care – PPO | Source: Ambulatory Visit | Attending: Family Medicine | Admitting: Family Medicine

## 2012-08-07 VITALS — BP 134/80 | Temp 97.4°F | Wt 258.0 lb

## 2012-08-07 DIAGNOSIS — O352XX0 Maternal care for (suspected) hereditary disease in fetus, not applicable or unspecified: Secondary | ICD-10-CM | POA: Insufficient documentation

## 2012-08-07 DIAGNOSIS — O09299 Supervision of pregnancy with other poor reproductive or obstetric history, unspecified trimester: Secondary | ICD-10-CM | POA: Insufficient documentation

## 2012-08-07 DIAGNOSIS — Z3481 Encounter for supervision of other normal pregnancy, first trimester: Secondary | ICD-10-CM

## 2012-08-07 DIAGNOSIS — O09529 Supervision of elderly multigravida, unspecified trimester: Secondary | ICD-10-CM | POA: Insufficient documentation

## 2012-08-07 DIAGNOSIS — O358XX0 Maternal care for other (suspected) fetal abnormality and damage, not applicable or unspecified: Secondary | ICD-10-CM | POA: Insufficient documentation

## 2012-08-07 DIAGNOSIS — O352XX1 Maternal care for (suspected) hereditary disease in fetus, fetus 1: Secondary | ICD-10-CM

## 2012-08-07 DIAGNOSIS — O2620 Pregnancy care for patient with recurrent pregnancy loss, unspecified trimester: Secondary | ICD-10-CM | POA: Insufficient documentation

## 2012-08-07 DIAGNOSIS — Z3480 Encounter for supervision of other normal pregnancy, unspecified trimester: Secondary | ICD-10-CM

## 2012-08-07 DIAGNOSIS — Z8751 Personal history of pre-term labor: Secondary | ICD-10-CM | POA: Insufficient documentation

## 2012-08-07 NOTE — Progress Notes (Signed)
p-99  Seen in MAU yesterday with UTI  Blood - neg today and no Leukecytes

## 2012-08-07 NOTE — Progress Notes (Signed)
Checking blood sugars x 4 wks; FBS wnl, PP's wnl; agreed to do Jelly Bean challenge at next visit; next ultrasound for recheck of anatomy today at 2 pm; seen in MAU yesterday diagnosed with UTI.  RX Keflex

## 2012-08-21 ENCOUNTER — Encounter: Payer: Self-pay | Admitting: Obstetrics and Gynecology

## 2012-08-21 ENCOUNTER — Ambulatory Visit (INDEPENDENT_AMBULATORY_CARE_PROVIDER_SITE_OTHER): Payer: BC Managed Care – PPO | Admitting: Obstetrics and Gynecology

## 2012-08-21 VITALS — BP 133/78 | Wt 263.0 lb

## 2012-08-21 DIAGNOSIS — Z3482 Encounter for supervision of other normal pregnancy, second trimester: Secondary | ICD-10-CM

## 2012-08-21 DIAGNOSIS — Z3480 Encounter for supervision of other normal pregnancy, unspecified trimester: Secondary | ICD-10-CM

## 2012-08-21 DIAGNOSIS — O09299 Supervision of pregnancy with other poor reproductive or obstetric history, unspecified trimester: Secondary | ICD-10-CM

## 2012-08-21 DIAGNOSIS — Z3483 Encounter for supervision of other normal pregnancy, third trimester: Secondary | ICD-10-CM

## 2012-08-21 DIAGNOSIS — O09293 Supervision of pregnancy with other poor reproductive or obstetric history, third trimester: Secondary | ICD-10-CM

## 2012-08-21 NOTE — Progress Notes (Signed)
p=103 

## 2012-08-21 NOTE — Progress Notes (Signed)
[place] 51st%ile at 29.4 1 hr glucola today. Watching diet. States home CBGs in target range. Completed Keflex for UTI.

## 2012-08-22 LAB — CBC
Hemoglobin: 11.3 g/dL — ABNORMAL LOW (ref 12.0–15.0)
MCH: 27.4 pg (ref 26.0–34.0)
MCV: 84 fL (ref 78.0–100.0)
RBC: 4.12 MIL/uL (ref 3.87–5.11)

## 2012-08-24 ENCOUNTER — Telehealth: Payer: Self-pay | Admitting: *Deleted

## 2012-08-24 NOTE — Telephone Encounter (Signed)
Pt notified of 28 week labs that were normal

## 2012-09-04 ENCOUNTER — Encounter: Payer: BC Managed Care – PPO | Admitting: Family

## 2012-09-07 ENCOUNTER — Emergency Department
Admission: EM | Admit: 2012-09-07 | Discharge: 2012-09-07 | Disposition: A | Discharge status: Home / Self Care | Payer: BC Managed Care – PPO | Source: Home / Self Care | Attending: Family Medicine | Admitting: Family Medicine

## 2012-09-07 DIAGNOSIS — J029 Acute pharyngitis, unspecified: Secondary | ICD-10-CM

## 2012-09-07 DIAGNOSIS — Z20818 Contact with and (suspected) exposure to other bacterial communicable diseases: Secondary | ICD-10-CM

## 2012-09-07 MED ORDER — AMOXICILLIN 875 MG PO TABS: 875.0000 mg | ORAL_TABLET | Freq: Two times a day (BID) | ORAL | Status: DC

## 2012-09-07 NOTE — ED Provider Notes (Signed)
CSN: [id number]     Arrival date & time 09/07/12  1929 History     First MD Initiated Contact with Patient 09/07/12 1947     Chief Complaint  Patient presents with  . Sore Throat    couple days   HPI  SORE THROAT  Onset: 2 days  Description: mild sore throat  Modifying factors: all 5 kids at home tested positive for strep throat. Currently [redacted] weeks pregnant.   Symptoms  Fever:  no URI symptoms: no Cough: no Headache: no Rash:  no Swollen glands:   Minimal  Recent Strep Exposure: no LUQ pain: no Heartburn/brash: no Allergy Symptoms: no  Red Flags STD exposure: no Breathing difficulty: no Drooling: no Trismus: no   Past Medical History  Diagnosis Date  . Miscarriage     x 8  . ADHD (attention deficit hyperactivity disorder)     husband  . Chromosomal abnormality   . HSV (herpes simplex virus) anogenital infection   . Kidney stone on left side    Past Surgical History  Procedure Laterality Date  . Dilation and curettage of uterus  1998  . Arm surgery for fracture    . Knee surgery    . Wisdom tooth extraction     Family History  Problem Relation Age of Onset  . Autism spectrum disorder Daughter   . Other Daughter     Pancreatic insufficiency.    History  Substance Use Topics  . Smoking status: Former Games developer  . Smokeless tobacco: Not on file  . Alcohol Use: No   OB History   Grav Para Term Preterm Abortions TAB SAB Ect Mult Living   16 6 5 1 9 1 8  0 0 6     Review of Systems  All other systems reviewed and are negative.    Allergies  Review of patient's allergies indicates no known allergies.  Home Medications   Current Outpatient Rx  Name  Route  Sig  Dispense  Refill  . Multiple Vitamins-Minerals (MULTIVITAMIN WITH MINERALS) tablet   Oral   Take 1 tablet by mouth daily.         Marland Kitchen ACCU-CHEK FASTCLIX LANCETS MISC   Does not apply   1 Device by Does not apply route 4 (four) times daily.   120 each   3   . acetaminophen (TYLENOL)  325 MG tablet   Oral   Take by mouth every 8 (eight) hours as needed for pain.         . Blood Glucose Monitoring Suppl (ACCU-CHEK NANO SMARTVIEW) W/DEVICE KIT   Does not apply   1 Device by Does not apply route 4 (four) times daily.   1 kit   0   . butalbital-acetaminophen-caffeine (FIORICET, ESGIC) 50-325-40 MG per tablet               . cephALEXin (KEFLEX) 500 MG capsule   Oral   Take 1 capsule (500 mg total) by mouth 4 (four) times daily.   40 capsule   0   . glucose blood test strip      Use as instructed   100 each   12   . ondansetron (ZOFRAN) 4 MG tablet               . pseudoephedrine (SUDAFED) 30 MG tablet   Oral   Take 60 mg by mouth every 6 (six) hours as needed for congestion.         . traMADol Janean Sark)  50 MG tablet   Oral   Take 1 tablet (50 mg total) by mouth every 8 (eight) hours as needed for pain.   30 tablet   0    BP 114/75  Pulse 85  Temp(Src) 98.3 F (36.8 C) (Oral)  Ht 5' 5.5" (1.664 m)  Wt 263 lb (119.296 kg)  BMI 43.08 kg/m2  SpO2 96%  LMP 01/16/2012 Physical Exam  Constitutional: She appears well-developed and well-nourished.  HENT:  Head: Normocephalic and atraumatic.  Right Ear: External ear normal.  Left Ear: External ear normal.  Mild post oropharyngeal erythema    Eyes: Conjunctivae are normal. Pupils are equal, round, and reactive to light.  Neck: Normal range of motion.  Cardiovascular: Normal rate and regular rhythm.   Pulmonary/Chest: Effort normal and breath sounds normal.  Abdominal: Soft.  Musculoskeletal: Normal range of motion.  Neurological: She is alert.  Skin: Skin is warm.    ED Course   Procedures (including critical care time)  Labs Reviewed - No data to display No results found. 1. Pharyngitis   2. Exposure to strep throat     MDM  Will place on amox for soft tissue coverage. Lower threshold for treatment given multiple strep exposure and pregnancy.   Discussed infectious and ENT  red flags.  Follow up as needed.     The patient and/or caregiver has been counseled thoroughly with regard to treatment plan and/or medications prescribed including dosage, schedule, interactions, rationale for use, and possible side effects and they verbalize understanding. Diagnoses and expected course of recovery discussed and will return if not improved as expected or if the condition worsens. Patient and/or caregiver verbalized understanding.        Doree Albee, MD 09/07/12 2001

## 2012-09-07 NOTE — ED Notes (Signed)
[name] has a sore throat for a couple of days. All five of her kids tested positive for strep throat. Denies fever, chills or sweats.

## 2012-09-10 ENCOUNTER — Telehealth: Payer: Self-pay | Admitting: *Deleted

## 2012-09-11 ENCOUNTER — Ambulatory Visit (INDEPENDENT_AMBULATORY_CARE_PROVIDER_SITE_OTHER): Payer: Self-pay | Admitting: Family

## 2012-09-11 VITALS — BP 111/76 | Wt 262.0 lb

## 2012-09-11 DIAGNOSIS — Z3481 Encounter for supervision of other normal pregnancy, first trimester: Secondary | ICD-10-CM

## 2012-09-11 DIAGNOSIS — Z3480 Encounter for supervision of other normal pregnancy, unspecified trimester: Secondary | ICD-10-CM

## 2012-09-11 NOTE — Progress Notes (Signed)
p-89 

## 2012-09-11 NOTE — Progress Notes (Signed)
Doing well; no questions or concerns.  Reviewed 1 hr glucola results (normal).

## 2012-09-25 ENCOUNTER — Ambulatory Visit (INDEPENDENT_AMBULATORY_CARE_PROVIDER_SITE_OTHER): Payer: BC Managed Care – PPO | Admitting: Family

## 2012-09-25 VITALS — BP 120/73 | Wt 263.0 lb

## 2012-09-25 DIAGNOSIS — Z3483 Encounter for supervision of other normal pregnancy, third trimester: Secondary | ICD-10-CM

## 2012-09-25 DIAGNOSIS — Z3480 Encounter for supervision of other normal pregnancy, unspecified trimester: Secondary | ICD-10-CM

## 2012-09-25 DIAGNOSIS — Z3481 Encounter for supervision of other normal pregnancy, first trimester: Secondary | ICD-10-CM

## 2012-09-25 LAB — OB RESULTS CONSOLE GC/CHLAMYDIA: Gonorrhea: NEGATIVE

## 2012-09-25 NOTE — Progress Notes (Signed)
p-87 

## 2012-09-25 NOTE — Progress Notes (Signed)
Reports obvious sensation of baby dropping this past week.  No bleeding or leaking of fluid.  Reports bruising that's remaining > obtain CBC.

## 2012-09-26 LAB — CBC
HCT: 34.9 % — ABNORMAL LOW (ref 36.0–46.0)
Hemoglobin: 11.6 g/dL — ABNORMAL LOW (ref 12.0–15.0)
RBC: 4.18 MIL/uL (ref 3.87–5.11)
WBC: 10.8 10*3/uL — ABNORMAL HIGH (ref 4.0–10.5)

## 2012-09-28 ENCOUNTER — Encounter: Payer: Self-pay | Admitting: Family

## 2012-09-28 LAB — CULTURE, BETA STREP (GROUP B ONLY)

## 2012-10-02 ENCOUNTER — Ambulatory Visit (INDEPENDENT_AMBULATORY_CARE_PROVIDER_SITE_OTHER): Payer: BC Managed Care – PPO | Admitting: Family

## 2012-10-02 VITALS — BP 127/83 | Wt 268.0 lb

## 2012-10-02 DIAGNOSIS — Z3493 Encounter for supervision of normal pregnancy, unspecified, third trimester: Secondary | ICD-10-CM

## 2012-10-02 DIAGNOSIS — Z3480 Encounter for supervision of other normal pregnancy, unspecified trimester: Secondary | ICD-10-CM

## 2012-10-02 NOTE — Progress Notes (Signed)
P-96 

## 2012-10-02 NOTE — Progress Notes (Signed)
No questions or concerns.  Reviewed GBS results.  Labor precautions reviewed.

## 2012-10-09 ENCOUNTER — Ambulatory Visit (INDEPENDENT_AMBULATORY_CARE_PROVIDER_SITE_OTHER): Payer: BC Managed Care – PPO | Admitting: Family

## 2012-10-09 ENCOUNTER — Encounter: Payer: Self-pay | Admitting: Family

## 2012-10-09 ENCOUNTER — Other Ambulatory Visit: Payer: Self-pay | Admitting: Family

## 2012-10-09 VITALS — BP 120/78 | Wt 267.0 lb

## 2012-10-09 DIAGNOSIS — Z3480 Encounter for supervision of other normal pregnancy, unspecified trimester: Secondary | ICD-10-CM

## 2012-10-09 DIAGNOSIS — A6009 Herpesviral infection of other urogenital tract: Secondary | ICD-10-CM

## 2012-10-09 DIAGNOSIS — Z3483 Encounter for supervision of other normal pregnancy, third trimester: Secondary | ICD-10-CM

## 2012-10-09 DIAGNOSIS — O98313 Other infections with a predominantly sexual mode of transmission complicating pregnancy, third trimester: Secondary | ICD-10-CM

## 2012-10-09 MED ORDER — VALACYCLOVIR HCL 500 MG PO TABS: 500.0000 mg | ORAL_TABLET | Freq: Every day | ORAL | Status: DC

## 2012-10-09 NOTE — Progress Notes (Signed)
Doing well - no questions or concerns

## 2012-10-16 ENCOUNTER — Ambulatory Visit (INDEPENDENT_AMBULATORY_CARE_PROVIDER_SITE_OTHER): Payer: BC Managed Care – PPO | Admitting: Advanced Practice Midwife

## 2012-10-16 VITALS — BP 116/79 | Wt 265.0 lb

## 2012-10-16 DIAGNOSIS — Z3481 Encounter for supervision of other normal pregnancy, first trimester: Secondary | ICD-10-CM

## 2012-10-16 DIAGNOSIS — Z3480 Encounter for supervision of other normal pregnancy, unspecified trimester: Secondary | ICD-10-CM

## 2012-10-16 NOTE — Progress Notes (Signed)
Feeling well. Active baby. No BH.

## 2012-10-16 NOTE — Progress Notes (Signed)
t-97.4  p-86

## 2012-10-20 ENCOUNTER — Telehealth: Payer: Self-pay | Admitting: *Deleted

## 2012-10-20 ENCOUNTER — Encounter (HOSPITAL_COMMUNITY): Payer: Self-pay

## 2012-10-20 ENCOUNTER — Encounter: Payer: BC Managed Care – PPO | Admitting: Obstetrics & Gynecology

## 2012-10-20 ENCOUNTER — Inpatient Hospital Stay (HOSPITAL_COMMUNITY)
Admission: AD | Admit: 2012-10-20 | Discharge: 2012-10-20 | Disposition: A | Discharge status: Home / Self Care | Payer: BC Managed Care – PPO | Source: Ambulatory Visit | Attending: Obstetrics and Gynecology | Admitting: Obstetrics and Gynecology

## 2012-10-20 DIAGNOSIS — O4693 Antepartum hemorrhage, unspecified, third trimester: Secondary | ICD-10-CM

## 2012-10-20 DIAGNOSIS — O4690 Antepartum hemorrhage, unspecified, unspecified trimester: Secondary | ICD-10-CM | POA: Insufficient documentation

## 2012-10-20 NOTE — MAU Provider Note (Signed)
Attestation of Attending Supervision of Advanced Practitioner (CNM/NP): Evaluation and management procedures were performed by the Advanced Practitioner under my supervision and collaboration.  I have reviewed the Advanced Practitioner's note and chart, and I agree with the management and plan.  Ronit Cranfield 10/20/2012 5:05 PM

## 2012-10-20 NOTE — Telephone Encounter (Signed)
Pt called stating that she thinks she lost her mucous plug yesterday.  Today she is not having any mucous but now has bright red bleeding.  She is in [place] now and advised to come to office to be checked.  Pt called back and stated that she is now having cramping along with the bleeding.  Advised to go to MAU now.  Pt voices understanding and states that she is going now.

## 2012-10-20 NOTE — MAU Note (Signed)
Patient states that she is having new onset vaginal bleeding (no bleeding now). She denies pain or contractions. Reports decreased fetal movement

## 2012-10-20 NOTE — Progress Notes (Signed)
Notified of patient and preference that walidah cnm evaluate her today.

## 2012-10-20 NOTE — MAU Provider Note (Signed)
  History     CSN: [id number]  Arrival date and time: 10/20/12 1346   First Provider Initiated Contact with Patient 10/20/12 1435      Chief Complaint  Patient presents with  . Vaginal Bleeding   HPI  Pt is [id number] at [redacted]w[redacted]d weeks IUP with report of bright red blood seen in underwear this am.  No report of contractions.  +fetal movement.  Desired to have bleeding assessed.    Past Medical History  Diagnosis Date  . Miscarriage     x 8  . ADHD (attention deficit hyperactivity disorder)     husband  . Chromosomal abnormality   . HSV (herpes simplex virus) anogenital infection   . Kidney stone on left side     Past Surgical History  Procedure Laterality Date  . Dilation and curettage of uterus  1998  . Arm surgery for fracture    . Knee surgery    . Wisdom tooth extraction      Family History  Problem Relation Age of Onset  . Autism spectrum disorder Daughter   . Other Daughter     Pancreatic insufficiency.     History  Substance Use Topics  . Smoking status: Former Games developer  . Smokeless tobacco: Not on file  . Alcohol Use: No    Allergies: No Known Allergies  Prescriptions prior to admission  Medication Sig Dispense Refill  . Prenatal Vit-Fe Fumarate-FA (PRENATAL MULTIVITAMIN) TABS tablet Take 1 tablet by mouth daily at 12 noon.      . valACYclovir (VALTREX) 500 MG tablet Take 1 tablet (500 mg total) by mouth daily.  30 tablet  6  . ACCU-CHEK FASTCLIX LANCETS MISC 1 Device by Does not apply route 4 (four) times daily.  120 each  3  . Blood Glucose Monitoring Suppl (ACCU-CHEK NANO SMARTVIEW) W/DEVICE KIT 1 Device by Does not apply route 4 (four) times daily.  1 kit  0  . glucose blood test strip Use as instructed  100 each  12    Review of Systems  Gastrointestinal: Positive for abdominal pain (intermittent contractions).  Genitourinary:       Spotting of blood  All other systems reviewed and are negative.   Physical Exam   Blood pressure 127/83, pulse  97, temperature 98.6 F (37 C), temperature source Oral, resp. rate 18, height 5\' 6"  (1.676 m), weight 121.564 kg (268 lb), last menstrual period 01/16/2012, SpO2 99.00%.  Physical Exam  Constitutional: She is oriented to person, place, and time. She appears well-developed and well-nourished. No distress.  HENT:  Head: Normocephalic.  Neck: Normal range of motion. Neck supple.  Cardiovascular: Normal rate, regular rhythm and normal heart sounds.   Respiratory: Effort normal and breath sounds normal.  GI: Soft. There is no tenderness.  Genitourinary: No bleeding around the vagina. Vaginal discharge (mucusy) found.  Musculoskeletal: Normal range of motion. She exhibits edema (trace bilat).  Neurological: She is alert and oriented to person, place, and time.  Skin: Skin is warm and dry.   Dilation: 3 Effacement (%): 50 Cervical Position: Middle Presentation: Vertex Exam by:: Roney Marion, CNM  MAU Course  Procedures  FHR 130's, +accels, reactive Toco - irregular  Assessment and Plan  37 yo [id number] at [redacted]w[redacted]d weeks IUP Bleeding in Pregnancy - Normal Exam Category I FHR Tracing  Plan: Discharge to home Provided reassurance Reviewed labor precautions  [facility] 10/20/2012, 2:36 PM

## 2012-10-20 NOTE — MAU Note (Signed)
Patient states she is having a little bloody show. Denies active bleeding. States baby is not moving as much as usual. No contractions.

## 2012-10-22 ENCOUNTER — Ambulatory Visit (INDEPENDENT_AMBULATORY_CARE_PROVIDER_SITE_OTHER): Payer: BC Managed Care – PPO | Admitting: Obstetrics & Gynecology

## 2012-10-22 VITALS — BP 135/91 | Wt 268.0 lb

## 2012-10-22 DIAGNOSIS — Z3483 Encounter for supervision of other normal pregnancy, third trimester: Secondary | ICD-10-CM

## 2012-10-22 DIAGNOSIS — O09299 Supervision of pregnancy with other poor reproductive or obstetric history, unspecified trimester: Secondary | ICD-10-CM

## 2012-10-22 DIAGNOSIS — Z3480 Encounter for supervision of other normal pregnancy, unspecified trimester: Secondary | ICD-10-CM

## 2012-10-22 DIAGNOSIS — O09293 Supervision of pregnancy with other poor reproductive or obstetric history, third trimester: Secondary | ICD-10-CM

## 2012-10-22 DIAGNOSIS — O480 Post-term pregnancy: Secondary | ICD-10-CM

## 2012-10-22 NOTE — Patient Instructions (Signed)
Return to clinic for any obstetric concerns or go to [facility] for evaluation  

## 2012-10-22 NOTE — Progress Notes (Signed)
Seen in MAU on 10/20/12 for labor evaluation, was 3/50/-3.  Today, she is 4/60/-3.  Postdates NST performed today was reviewed and was found to be reactive.  Needs NST and AFI on 10/26/12. No other complaints or concerns.  Fetal movement and labor precautions reviewed.

## 2012-10-22 NOTE — Progress Notes (Signed)
P - 104 

## 2012-10-23 ENCOUNTER — Encounter: Payer: BC Managed Care – PPO | Admitting: Advanced Practice Midwife

## 2012-10-26 ENCOUNTER — Ambulatory Visit (INDEPENDENT_AMBULATORY_CARE_PROVIDER_SITE_OTHER): Payer: BC Managed Care – PPO | Admitting: Family

## 2012-10-26 ENCOUNTER — Encounter (HOSPITAL_COMMUNITY): Payer: Self-pay | Admitting: Anesthesiology

## 2012-10-26 ENCOUNTER — Encounter (HOSPITAL_COMMUNITY): Payer: Self-pay | Admitting: *Deleted

## 2012-10-26 ENCOUNTER — Inpatient Hospital Stay (HOSPITAL_COMMUNITY): Payer: BC Managed Care – PPO

## 2012-10-26 ENCOUNTER — Inpatient Hospital Stay (HOSPITAL_COMMUNITY): Payer: BC Managed Care – PPO | Admitting: Anesthesiology

## 2012-10-26 ENCOUNTER — Inpatient Hospital Stay (HOSPITAL_COMMUNITY)
Admission: AD | Admit: 2012-10-26 | Discharge: 2012-10-28 | DRG: 372 | Disposition: A | Discharge status: Home / Self Care | Payer: BC Managed Care – PPO | Source: Ambulatory Visit | Attending: Obstetrics & Gynecology | Admitting: Obstetrics & Gynecology

## 2012-10-26 VITALS — BP 137/81 | Wt 269.0 lb

## 2012-10-26 DIAGNOSIS — O09529 Supervision of elderly multigravida, unspecified trimester: Secondary | ICD-10-CM | POA: Diagnosis present

## 2012-10-26 DIAGNOSIS — O09292 Supervision of pregnancy with other poor reproductive or obstetric history, second trimester: Secondary | ICD-10-CM

## 2012-10-26 DIAGNOSIS — O479 False labor, unspecified: Secondary | ICD-10-CM

## 2012-10-26 DIAGNOSIS — O98519 Other viral diseases complicating pregnancy, unspecified trimester: Secondary | ICD-10-CM | POA: Diagnosis present

## 2012-10-26 DIAGNOSIS — Z3483 Encounter for supervision of other normal pregnancy, third trimester: Secondary | ICD-10-CM

## 2012-10-26 DIAGNOSIS — A6000 Herpesviral infection of urogenital system, unspecified: Secondary | ICD-10-CM

## 2012-10-26 LAB — CBC
HCT: 33.7 % — ABNORMAL LOW (ref 36.0–46.0)
MCH: 27.7 pg (ref 26.0–34.0)
MCV: 82.6 fL (ref 78.0–100.0)
Platelets: 288 10*3/uL (ref 150–400)
RBC: 4.08 MIL/uL (ref 3.87–5.11)
RDW: 14.3 % (ref 11.5–15.5)
WBC: 13.6 10*3/uL — ABNORMAL HIGH (ref 4.0–10.5)

## 2012-10-26 LAB — RPR: RPR Ser Ql: NONREACTIVE

## 2012-10-26 LAB — PREPARE RBC (CROSSMATCH)

## 2012-10-26 MED ORDER — DIPHENHYDRAMINE HCL 50 MG/ML IJ SOLN: 12.5000 mg | INTRAMUSCULAR | Status: DC | PRN

## 2012-10-26 MED ORDER — MISOPROSTOL 200 MCG PO TABS: 1000.0000 ug | ORAL_TABLET | Freq: Once | ORAL | Status: DC

## 2012-10-26 MED ORDER — LIDOCAINE HCL (PF) 1 % IJ SOLN
30.0000 mL | INTRAMUSCULAR | Status: DC | PRN
  Filled 2012-10-26 (×2): qty 30

## 2012-10-26 MED ORDER — PHENYLEPHRINE 40 MCG/ML (10ML) SYRINGE FOR IV PUSH (FOR BLOOD PRESSURE SUPPORT)
80.0000 ug | PREFILLED_SYRINGE | INTRAVENOUS | Status: DC | PRN
  Filled 2012-10-26: qty 2

## 2012-10-26 MED ORDER — FENTANYL 2.5 MCG/ML BUPIVACAINE 1/10 % EPIDURAL INFUSION (WH - ANES)
14.0000 mL/h | INTRAMUSCULAR | Status: DC | PRN
  Administered 2012-10-26: 14 mL/h via EPIDURAL
  Filled 2012-10-26: qty 125

## 2012-10-26 MED ORDER — ACETAMINOPHEN 325 MG PO TABS: 650.0000 mg | ORAL_TABLET | ORAL | Status: DC | PRN

## 2012-10-26 MED ORDER — PHENYLEPHRINE 40 MCG/ML (10ML) SYRINGE FOR IV PUSH (FOR BLOOD PRESSURE SUPPORT)
80.0000 ug | PREFILLED_SYRINGE | INTRAVENOUS | Status: DC | PRN
  Filled 2012-10-26: qty 5
  Filled 2012-10-26: qty 2

## 2012-10-26 MED ORDER — CITRIC ACID-SODIUM CITRATE 334-500 MG/5ML PO SOLN: 30.0000 mL | ORAL | Status: DC | PRN

## 2012-10-26 MED ORDER — OXYTOCIN BOLUS FROM INFUSION
500.0000 mL | INTRAVENOUS | Status: DC
  Administered 2012-10-27: 500 mL via INTRAVENOUS

## 2012-10-26 MED ORDER — IBUPROFEN 600 MG PO TABS
600.0000 mg | ORAL_TABLET | Freq: Four times a day (QID) | ORAL | Status: DC | PRN
  Administered 2012-10-27: 600 mg via ORAL
  Filled 2012-10-26: qty 1

## 2012-10-26 MED ORDER — LACTATED RINGERS IV SOLN
INTRAVENOUS | Status: DC
  Administered 2012-10-26 (×2): via INTRAVENOUS

## 2012-10-26 MED ORDER — OXYTOCIN 40 UNITS IN LACTATED RINGERS INFUSION - SIMPLE MED: 62.5000 mL/h | INTRAVENOUS | Status: DC

## 2012-10-26 MED ORDER — FLEET ENEMA 7-19 GM/118ML RE ENEM: 1.0000 | ENEMA | RECTAL | Status: DC | PRN

## 2012-10-26 MED ORDER — LACTATED RINGERS IV SOLN: 500.0000 mL | INTRAVENOUS | Status: DC | PRN

## 2012-10-26 MED ORDER — LIDOCAINE HCL (PF) 1 % IJ SOLN
INTRAMUSCULAR | Status: DC | PRN
  Administered 2012-10-26 (×2): 5 mL

## 2012-10-26 MED ORDER — ONDANSETRON HCL 4 MG/2ML IJ SOLN: 4.0000 mg | Freq: Four times a day (QID) | INTRAMUSCULAR | Status: DC | PRN

## 2012-10-26 MED ORDER — OXYCODONE-ACETAMINOPHEN 5-325 MG PO TABS: 1.0000 | ORAL_TABLET | ORAL | Status: DC | PRN

## 2012-10-26 MED ORDER — TERBUTALINE SULFATE 1 MG/ML IJ SOLN: 0.2500 mg | Freq: Once | INTRAMUSCULAR | Status: AC | PRN

## 2012-10-26 MED ORDER — MISOPROSTOL 200 MCG PO TABS
ORAL_TABLET | ORAL | Status: AC
  Filled 2012-10-26: qty 5

## 2012-10-26 MED ORDER — VALACYCLOVIR HCL 500 MG PO TABS
500.0000 mg | ORAL_TABLET | Freq: Every day | ORAL | Status: DC
  Administered 2012-10-26: 500 mg via ORAL
  Filled 2012-10-26 (×3): qty 1

## 2012-10-26 MED ORDER — OXYTOCIN 40 UNITS IN LACTATED RINGERS INFUSION - SIMPLE MED
1.0000 m[IU]/min | INTRAVENOUS | Status: DC
  Administered 2012-10-26: 2 m[IU]/min via INTRAVENOUS
  Filled 2012-10-26: qty 1000

## 2012-10-26 MED ORDER — LACTATED RINGERS IV SOLN
500.0000 mL | Freq: Once | INTRAVENOUS | Status: AC
  Administered 2012-10-26: 500 mL via INTRAVENOUS

## 2012-10-26 MED ORDER — EPHEDRINE 5 MG/ML INJ
10.0000 mg | INTRAVENOUS | Status: DC | PRN
  Filled 2012-10-26: qty 4
  Filled 2012-10-26: qty 2

## 2012-10-26 MED ORDER — EPHEDRINE 5 MG/ML INJ
10.0000 mg | INTRAVENOUS | Status: DC | PRN
  Filled 2012-10-26: qty 2

## 2012-10-26 NOTE — Anesthesia Preprocedure Evaluation (Signed)
Anesthesia Evaluation  Patient identified by MRN, date of birth, ID band Patient awake    Reviewed: Allergy & Precautions, H&P , Patient's Chart, lab work & pertinent test results  Airway Mallampati: III TM Distance: >3 FB Neck ROM: full    Dental no notable dental hx.    Pulmonary neg pulmonary ROS,  breath sounds clear to auscultation  Pulmonary exam normal       Cardiovascular negative cardio ROS  Rhythm:regular Rate:Normal     Neuro/Psych negative neurological ROS  negative psych ROS   GI/Hepatic negative GI ROS, Neg liver ROS,   Endo/Other  negative endocrine ROSMorbid obesity  Renal/GU Renal diseasenegative Renal ROS     Musculoskeletal   Abdominal   Peds  Hematology negative hematology ROS (+)   Anesthesia Other Findings   Reproductive/Obstetrics (+) Pregnancy                           Anesthesia Physical Anesthesia Plan  ASA: III  Anesthesia Plan: Epidural   Post-op Pain Management:    Induction:   Airway Management Planned:   Additional Equipment:   Intra-op Plan:   Post-operative Plan:   Informed Consent: I have reviewed the patients History and Physical, chart, labs and discussed the procedure including the risks, benefits and alternatives for the proposed anesthesia with the patient or authorized representative who has indicated his/her understanding and acceptance.     Plan Discussed with:   Anesthesia Plan Comments:         Anesthesia Quick Evaluation

## 2012-10-26 NOTE — MAU Note (Signed)
Patient was sent from the office to rule out rupture of membranes. Was 5-6 cm in the office and patient states she thinks she felt baby hair. Patient states she is only feeling about 3 cm per hour. Reports no leaking or bleeding and reports good fetal movement.

## 2012-10-26 NOTE — H&P (Signed)
[name] is a 37 y.o. female [id number] with IUP at [redacted]w[redacted]d presenting for r/o rupture and contractions. Pt states she has been having irregular, every 4-7 minutes contractions for the last week, associated with none vaginal bleeding. She has normal active fetal movement.    Pt was seen in [place] today for routine OBV and on exam, had the appearance of SROM because hair was felt and on speculum exam there was no fluid below the head.  She was sent here for r/o rupture with amnisure and AFI.    Pt states she is extremely uncomfortable and requiring narcotics at night just to sleep and stall out her contractions.   [facility] [place]. Has been uncomplicated thus far. GBS negative. HSV + but has been on suppression without lesions and nothing noted on speculum earlier today.   Prenatal History/Complications:  Past Medical History: Past Medical History  Diagnosis Date  . Miscarriage     x 8  . ADHD (attention deficit hyperactivity disorder)     husband  . Chromosomal abnormality   . HSV (herpes simplex virus) anogenital infection   . Kidney stone on left side     Past Surgical History: Past Surgical History  Procedure Laterality Date  . Dilation and curettage of uterus  1998  . Arm surgery for fracture    . Knee surgery    . Wisdom tooth extraction      Obstetrical History: OB History   Grav Para Term Preterm Abortions TAB SAB Ect Mult Living   16 6 5 1 9 1 8  0 0 6    all SVDs- proven to 8lb14oz.    Gynecological History: OB History   Grav Para Term Preterm Abortions TAB SAB Ect Mult Living   16 6 5 1 9 1 8  0 0 6      Social History: History   Social History  . Marital Status: Married    Spouse Name: [name]    Number of Children: 4  . Years of Education: college   Occupational History  . Homemaker    Social History Main Topics  . Smoking status: Former Games developer  . Smokeless tobacco: Not on file  . Alcohol Use: No  . Drug Use: No  . Sexual  Activity: Yes    Partners: Male   Other Topics Concern  . Not on file   Social History Narrative  . No narrative on file    Family History: Family History  Problem Relation Age of Onset  . Autism spectrum disorder Daughter   . Other Daughter     Pancreatic insufficiency.     Allergies: No Known Allergies  Prescriptions prior to admission  Medication Sig Dispense Refill  . acetaminophen (TYLENOL) 325 MG tablet Take 650 mg by mouth every 6 (six) hours as needed for pain.      . diphenhydrAMINE (BENADRYL) 25 MG tablet Take 25 mg by mouth every 6 (six) hours as needed for itching or sleep.      . Prenatal Vit-Fe Fumarate-FA (PRENATAL MULTIVITAMIN) TABS tablet Take 1 tablet by mouth daily at 12 noon.      . traMADol (ULTRAM) 50 MG tablet Take 50 mg by mouth every 6 (six) hours as needed for pain.      . valACYclovir (VALTREX) 500 MG tablet Take 1 tablet (500 mg total) by mouth daily.  30 tablet  6     Review of Systems   Constitutional: Negative for fever, chills, weight loss, malaise/fatigue and  diaphoresis.  HENT: Negative for hearing loss, ear pain, nosebleeds, congestion, sore throat, neck pain, tinnitus and ear discharge.   Eyes: Negative for blurred vision, double vision, photophobia, pain, discharge and redness.  Respiratory: Negative for cough, hemoptysis, sputum production, shortness of breath, wheezing and stridor.   Cardiovascular: Negative for chest pain, palpitations, orthopnea,  leg swelling  Gastrointestinal: Positive for abdominal pain. Negative for heartburn, nausea, vomiting, diarrhea, constipation, blood in stool Genitourinary: Negative for dysuria, urgency, frequency, hematuria and flank pain.  Musculoskeletal: Negative for myalgias, back pain, joint pain and falls.  Skin: Negative for itching and rash.  Neurological: Negative for dizziness, tingling, tremors, sensory change, speech change, focal weakness, seizures, loss of consciousness, weakness and  headaches.  Endo/Heme/Allergies: Negative for environmental allergies and polydipsia. Does not bruise/bleed easily.  Psychiatric/Behavioral: Negative for depression, suicidal ideas, hallucinations, memory loss and substance abuse. The patient is not nervous/anxious and does not have insomnia.       Blood pressure 120/77, pulse 88, temperature 98.9 F (37.2 C), temperature source Oral, resp. rate 20, last menstrual period 01/16/2012, SpO2 100.00%. General appearance: alert, cooperative and no distress Lungs: clear to auscultation bilaterally Heart: regular rate and rhythm Abdomen: soft, non-tender; bowel sounds normal Pelvic: 5/50/-3 Extremities: Homans sign is negative, no sign of DVT DTR's 2+ Presentation: cephalic Fetal monitoringBaseline: 125 bpm, Variability: Good {> 6 bpm), Accelerations: Reactive and Decelerations: Absent Uterine activity irregular       Prenatal labs: ABO, Rh: A/POS/-- (02/18 1500) Antibody: NEG (02/18 1500) Rubella:   RPR: NON REAC (08/01 0830)  HBsAg: NEGATIVE (02/18 1500)  HIV: NON REACTIVE (08/01 0830)  GBS:    1 hr Glucola 81 Genetic screening  Normal harmony Anatomy US normal    Assessment: [name] is a 37 y.o. [id number] with an IUP at [redacted]w[redacted]d presenting for r/o rupture and contractions. Pt with negative amnisure and normal AFI but continued discomfort and a bishops score of 7. After long discussion of risks to induction, pt opted to be admitted for induction at term.   Plan: *Admit to L&D - routine orders - epidural prn   * IOL - start pit - will get into a good contraction pattern and then plan for AROM  *FWB - cat I tracing - GBS neg  * anticipate SVD   [name] L, MD 10/26/2012, 4:23 PM

## 2012-10-26 NOTE — Progress Notes (Signed)
P = 87 

## 2012-10-26 NOTE — H&P (Signed)
Attestation of Attending Supervision of Fellow: Evaluation and management procedures were performed by the Fellow under my supervision and collaboration.  I have reviewed the Fellow's note and chart, and I agree with the management and plan.    

## 2012-10-26 NOTE — Progress Notes (Signed)
Presents with regular contractions, feels pain in intolerable.  Reports questionable leaking of fluid; fern negative, pooling negative.  Cervix 5-6/60 > to MAU for amnisure and labor assessment.

## 2012-10-26 NOTE — Anesthesia Procedure Notes (Signed)
Epidural Patient location during procedure: OB Start time: 10/26/2012 9:24 PM  Staffing Anesthesiologist: Angus Seller., Harrell Gave. Performed by: anesthesiologist   Preanesthetic Checklist Completed: patient identified, site marked, surgical consent, pre-op evaluation, timeout performed, IV checked, risks and benefits discussed and monitors and equipment checked  Epidural Patient position: sitting Prep: site prepped and draped and DuraPrep Patient monitoring: continuous pulse ox and blood pressure Approach: midline Injection technique: LOR air and LOR saline  Needle:  Needle type: Tuohy  Needle gauge: 17 G Needle length: 9 cm and 9 Needle insertion depth: 8 cm Catheter type: closed end flexible Catheter size: 19 Gauge Catheter at skin depth: 13 cm Test dose: negative  Assessment Events: blood not aspirated, injection not painful, no injection resistance, negative IV test and no paresthesia  Additional Notes Patient identified.  Risk benefits discussed including failed block, incomplete pain control, headache, nerve damage, paralysis, blood pressure changes, nausea, vomiting, reactions to medication both toxic or allergic, and postpartum back pain.  Patient expressed understanding and wished to proceed.  All questions were answered.  Sterile technique used throughout procedure and epidural site dressed with sterile barrier dressing. No paresthesia or other complications noted.The patient did not experience any signs of intravascular injection such as tinnitus or metallic taste in mouth nor signs of intrathecal spread such as rapid motor block. Please see nursing notes for vital signs.

## 2012-10-26 NOTE — Progress Notes (Signed)
Dr Reola Calkins notified of patient. She is awaiting ultrasound report for plan of care.

## 2012-10-27 ENCOUNTER — Encounter (HOSPITAL_COMMUNITY): Payer: Self-pay | Admitting: Obstetrics

## 2012-10-27 LAB — CBC
MCH: 28 pg (ref 26.0–34.0)
Platelets: 257 10*3/uL (ref 150–400)
RBC: 3.82 MIL/uL — ABNORMAL LOW (ref 3.87–5.11)
RDW: 14.4 % (ref 11.5–15.5)
WBC: 13.8 10*3/uL — ABNORMAL HIGH (ref 4.0–10.5)

## 2012-10-27 MED ORDER — LANOLIN HYDROUS EX OINT: TOPICAL_OINTMENT | CUTANEOUS | Status: DC | PRN

## 2012-10-27 MED ORDER — SENNOSIDES-DOCUSATE SODIUM 8.6-50 MG PO TABS: 2.0000 | ORAL_TABLET | Freq: Every day | ORAL | Status: DC

## 2012-10-27 MED ORDER — OXYCODONE-ACETAMINOPHEN 5-325 MG PO TABS
1.0000 | ORAL_TABLET | ORAL | Status: DC | PRN
  Administered 2012-10-27 (×2): 2 via ORAL
  Administered 2012-10-27: 1 via ORAL
  Administered 2012-10-27 – 2012-10-28 (×4): 2 via ORAL
  Filled 2012-10-27: qty 2
  Filled 2012-10-27: qty 1
  Filled 2012-10-27 (×4): qty 2

## 2012-10-27 MED ORDER — TETANUS-DIPHTH-ACELL PERTUSSIS 5-2.5-18.5 LF-MCG/0.5 IM SUSP: 0.5000 mL | Freq: Once | INTRAMUSCULAR | Status: DC

## 2012-10-27 MED ORDER — PRENATAL MULTIVITAMIN CH
1.0000 | ORAL_TABLET | Freq: Every day | ORAL | Status: DC
  Administered 2012-10-27: 1 via ORAL
  Filled 2012-10-27: qty 1

## 2012-10-27 MED ORDER — ONDANSETRON HCL 4 MG PO TABS: 4.0000 mg | ORAL_TABLET | ORAL | Status: DC | PRN

## 2012-10-27 MED ORDER — SENNOSIDES-DOCUSATE SODIUM 8.6-50 MG PO TABS
2.0000 | ORAL_TABLET | Freq: Every day | ORAL | Status: DC
  Administered 2012-10-27: 2 via ORAL

## 2012-10-27 MED ORDER — MEASLES, MUMPS & RUBELLA VAC ~~LOC~~ INJ
0.5000 mL | INJECTION | Freq: Once | SUBCUTANEOUS | Status: DC
  Filled 2012-10-27: qty 0.5

## 2012-10-27 MED ORDER — HYDROMORPHONE HCL 2 MG PO TABS
1.0000 mg | ORAL_TABLET | Freq: Once | ORAL | Status: DC
  Filled 2012-10-27: qty 1

## 2012-10-27 MED ORDER — DIBUCAINE 1 % RE OINT: 1.0000 "application " | TOPICAL_OINTMENT | RECTAL | Status: DC | PRN

## 2012-10-27 MED ORDER — BENZOCAINE-MENTHOL 20-0.5 % EX AERO: 1.0000 "application " | INHALATION_SPRAY | CUTANEOUS | Status: DC | PRN

## 2012-10-27 MED ORDER — IBUPROFEN 600 MG PO TABS
600.0000 mg | ORAL_TABLET | Freq: Four times a day (QID) | ORAL | Status: DC
  Administered 2012-10-27 – 2012-10-28 (×5): 600 mg via ORAL
  Filled 2012-10-27 (×5): qty 1

## 2012-10-27 MED ORDER — ONDANSETRON HCL 4 MG/2ML IJ SOLN: 4.0000 mg | INTRAMUSCULAR | Status: DC | PRN

## 2012-10-27 MED ORDER — ZOLPIDEM TARTRATE 5 MG PO TABS: 5.0000 mg | ORAL_TABLET | Freq: Every evening | ORAL | Status: DC | PRN

## 2012-10-27 MED ORDER — DIPHENHYDRAMINE HCL 25 MG PO CAPS: 25.0000 mg | ORAL_CAPSULE | Freq: Four times a day (QID) | ORAL | Status: DC | PRN

## 2012-10-27 MED ORDER — SIMETHICONE 80 MG PO CHEW: 80.0000 mg | CHEWABLE_TABLET | ORAL | Status: DC | PRN

## 2012-10-27 MED ORDER — WITCH HAZEL-GLYCERIN EX PADS
1.0000 "application " | MEDICATED_PAD | CUTANEOUS | Status: DC | PRN
  Administered 2012-10-27: 1 via TOPICAL

## 2012-10-27 NOTE — Progress Notes (Signed)
Post Partum Day 1 Subjective: no complaints, up ad lib, voiding and tolerating PO  Objective: Blood pressure 102/64, pulse 83, temperature 98.6 F (37 C), temperature source Oral, resp. rate 18, height 5\' 6"  (1.676 m), weight 121.564 kg (268 lb), last menstrual period 01/16/2012, SpO2 99.00%, unknown if currently breastfeeding.  Physical Exam:  General: alert, cooperative and no distress Lochia: appropriate Uterine Fundus: firm Incision: na DVT Evaluation: No evidence of DVT seen on physical exam. No cords or calf tenderness. No significant calf/ankle edema.   Recent Labs  10/26/12 1730 10/27/12 0604  HGB 11.3* 10.7*  HCT 33.7* 31.6*    Assessment/Plan: Plan for discharge tomorrow, Breastfeeding, Lactation consult and Contraception undecided but leaning toward vasectomy   LOS: 1 day   Sharnita Bogucki L 10/27/2012, 9:08 AM

## 2012-10-27 NOTE — Anesthesia Postprocedure Evaluation (Signed)
  Anesthesia Post-op Note  Anesthesia Post Note  Patient: [name]  Procedure(s) Performed: * No procedures listed *  Anesthesia type: Epidural  Patient location: Mother/Baby  Post pain: Pain level controlled  Post assessment: Post-op Vital signs reviewed  Last Vitals:  Filed Vitals:   10/27/12 0645  BP: 102/64  Pulse: 83  Temp: 37 C  Resp: 18    Post vital signs: Reviewed  Level of consciousness:alert  Complications: No apparent anesthesia complications

## 2012-10-28 MED ORDER — OXYCODONE-ACETAMINOPHEN 5-325 MG PO TABS: 1.0000 | ORAL_TABLET | ORAL | Status: DC | PRN

## 2012-10-28 MED ORDER — IBUPROFEN 600 MG PO TABS: 600.0000 mg | ORAL_TABLET | Freq: Four times a day (QID) | ORAL | Status: DC

## 2012-10-30 LAB — TYPE AND SCREEN
ABO/RH(D): A POS
Unit division: 0
Unit division: 0

## 2012-10-30 NOTE — Discharge Summary (Signed)
Obstetric Discharge Summary Reason for Admission: onset of labor and rupture of membranes Prenatal Procedures: NST Intrapartum Procedures: spontaneous vaginal delivery Postpartum Procedures: none Complications-Operative and Postpartum: 1st degree perineal laceration Hemoglobin  Date Value Range Status  10/27/2012 10.7* 12.0 - 15.0 g/dL Final     HCT  Date Value Range Status  10/27/2012 31.6* 36.0 - 46.0 % Final  Hospital Course: [name] is a 37 y.o. female [id number] with IUP at [redacted]w[redacted]d presenting for r/o rupture and contractions. Pt states she has been having irregular, every 4-7 minutes contractions for the last week, associated with none vaginal bleeding. She has normal active fetal movement.  Pt was seen in [place] today for routine OBV and on exam, had the appearance of SROM because hair was felt and on speculum exam there was no fluid below the head. She was sent here for r/o rupture with amnisure and AFI.  Pt states she is extremely uncomfortable and requiring narcotics at night just to sleep and stall out her contractions.  PNCare at [place]. Has been uncomplicated thus far. GBS negative. HSV + but has been on suppression without lesions and nothing noted on speculum earlier today.  Delivery Note  At 11:59 PM a viable female was delivered via Vaginal, Spontaneous Delivery (Presentation: direct occiput anterior ). APGAR: 9, 9 ; weight TBD.  Placenta status: Intact, Spontaneous. Cord: 3 vessels with the following complications: None.  Anesthesia: Epidural  Episiotomy: None  Lacerations: 1st degree  Suture Repair: 3.0 vicryl  Est. Blood Loss (mL): 300  Mom to postpartum. Baby to nursery-stable.  Pt progressed to c/c/+2 and pushed to deliver a liveborn female infant via SVD with spontaneous cry. Apgars were 9 and 9. Placenta delivered spontaneously intact with 3 vessel cord. No postpartum hemorrhage. Mom and baby stable. 1st degree repaired with single stitch for hemostasis.   BECK, KELI L  10/27/2012, 12:25 AM    Has done well postpartum, except for some pain. Ready for discharge    Physical Exam:  General: alert and no distress Lochia: appropriate Uterine Fundus: firm Incision: healing well DVT Evaluation: No evidence of DVT seen on physical exam.  Discharge Diagnoses: Term Pregnancy-delivered  Discharge Information: Date: 10/30/2012 Activity: unrestricted and pelvic rest Diet: routine Medications: PNV, Ibuprofen and Percocet Condition: stable Instructions: refer to practice specific booklet Discharge to: home   Newborn Data: Live born female  Birth Weight: 8 lb 4.8 oz (3765 g) APGAR: 9, 9  Home with mother.  [facility] 10/30/2012, 6:35 PM

## 2012-11-02 NOTE — Discharge Summary (Signed)
Attestation of Attending Supervision of Advanced Practitioner (CNM/NP): Evaluation and management procedures were performed by the Advanced Practitioner under my supervision and collaboration.  I have reviewed the Advanced Practitioner's note and chart, and I agree with the management and plan.  Ceanna Wareing 11/02/2012 12:29 PM

## 2013-06-11 ENCOUNTER — Ambulatory Visit (INDEPENDENT_AMBULATORY_CARE_PROVIDER_SITE_OTHER): Payer: BC Managed Care – PPO | Admitting: Family Medicine

## 2013-06-11 ENCOUNTER — Encounter: Payer: Self-pay | Admitting: Family Medicine

## 2013-06-11 VITALS — BP 123/79 | HR 69 | Temp 98.4°F | Wt 259.0 lb

## 2013-06-11 DIAGNOSIS — R1032 Left lower quadrant pain: Secondary | ICD-10-CM

## 2013-06-11 LAB — POCT URINALYSIS DIPSTICK
Bilirubin, UA: NEGATIVE
Blood, UA: NEGATIVE
Glucose, UA: NEGATIVE
Ketones, UA: NEGATIVE
LEUKOCYTES UA: NEGATIVE
NITRITE UA: NEGATIVE
PROTEIN UA: NEGATIVE
Spec Grav, UA: 1.025
UROBILINOGEN UA: 0.2
pH, UA: 6.5

## 2013-06-11 NOTE — Addendum Note (Signed)
Addended by: Wyline Beady on: 06/11/2013 04:06 PM   Modules accepted: Orders

## 2013-06-11 NOTE — Progress Notes (Signed)
CC: [name] is a 38 y.o. female is here for pelvic pain/abd pain   Subjective: HPI:  Complains of left lower quadrant pain that has been present on a daily basis for one month. Mild in severity, described as a pressure, "like a balloon that has too much in it.", radiates into the left flank wrapping around the perimeter of her pelvis. It is worse with bowel movements, Pain is slightly improved with ibuprofen, no other interventions and nothing else makes better or worse.. There is no positional component to her pain.  She denies any recent or remote fevers, chills, vaginal discharge, urinary hesitancy, urinary urgency, dysuria, diarrhea, nor constipation. She does not have a bowel movement on a daily basis.  Denies fevers, chills, nausea, lack of appetite.  She's had one period since the birth of her most recent child pain did not change through menses.  Review Of Systems Outlined In HPI  Past Medical History  Diagnosis Date  . Miscarriage     x 8  . ADHD (attention deficit hyperactivity disorder)     husband  . Chromosomal abnormality   . HSV (herpes simplex virus) anogenital infection   . Kidney stone on left side     Past Surgical History  Procedure Laterality Date  . Dilation and curettage of uterus  1998  . Arm surgery for fracture    . Knee surgery    . Wisdom tooth extraction     Family History  Problem Relation Age of Onset  . Autism spectrum disorder Daughter   . Other Daughter     Pancreatic insufficiency.     History   Social History  . Marital Status: Married    Spouse Name: Morrie Sheldonshley    Number of Children: 4  . Years of Education: college   Occupational History  . Homemaker    Social History Main Topics  . Smoking status: Former Games developermoker  . Smokeless tobacco: Not on file  . Alcohol Use: No  . Drug Use: No  . Sexual Activity: Yes    Partners: Male   Other Topics Concern  . Not on file   Social History Narrative  . No narrative on file      Objective: BP 123/79  Pulse 69  Temp(Src) 98.4 F (36.9 C) (Oral)  Wt 259 lb (117.482 kg)  General: Alert and Oriented, No Acute Distress HEENT: Pupils equal, round, reactive to light. Conjunctivae clear.   Lungs: Clear to auscultation bilaterally, no wheezing/ronchi/rales.  Comfortable work of breathing. Good air movement. Cardiac: Regular rate and rhythm. Normal S1/S2.  No murmurs, rubs, nor gallops.   Abdomen: Normal bowel sounds, soft without palpable masses, no guarding, no rebound. Pain is reproduced with palpation of the left lower quadrant or in the suprapubic region that radiates into her left flank. Back: No CVA tenderness, pain is not reproduced with lateral or anterior posterior compression of the rib cage Extremities: No peripheral edema.  Strong peripheral pulses.  Mental Status: No depression, anxiety, nor agitation. Skin: Warm and dry.  Assessment & Plan: Herbert SetaHeather was seen today for pelvic pain/abd pain.  Diagnoses and associated orders for this visit:  LLQ pain - Urine culture - GC/Chlamydia Probe Amp    Discussed with patient that I differential includes in descending likelihood relative constipation, genitourinary infection, ovarian cyst. Unlikely infection other than above. Will obtain urine culture and GC/Chlamydia, before we begin further labs or imaging I like her to take 3 doses of MiraLax on a daily  basis for the next 3 days to see if this improves her discomfort.Signs and symptoms requring emergent/urgent reevaluation were discussed with the patient.   Return if symptoms worsen or fail to improve.

## 2013-06-12 LAB — GC/CHLAMYDIA PROBE AMP
CT PROBE, AMP APTIMA: NEGATIVE
GC Probe RNA: NEGATIVE

## 2013-06-13 LAB — URINE CULTURE: Colony Count: 40000

## 2013-07-02 ENCOUNTER — Ambulatory Visit (INDEPENDENT_AMBULATORY_CARE_PROVIDER_SITE_OTHER): Payer: BC Managed Care – PPO | Admitting: Advanced Practice Midwife

## 2013-07-02 ENCOUNTER — Encounter: Payer: Self-pay | Admitting: Advanced Practice Midwife

## 2013-07-02 VITALS — BP 124/80 | HR 99 | Resp 16 | Ht 66.0 in | Wt 258.0 lb

## 2013-07-02 DIAGNOSIS — Z01419 Encounter for gynecological examination (general) (routine) without abnormal findings: Secondary | ICD-10-CM

## 2013-07-02 DIAGNOSIS — Z124 Encounter for screening for malignant neoplasm of cervix: Secondary | ICD-10-CM

## 2013-07-02 DIAGNOSIS — Z1151 Encounter for screening for human papillomavirus (HPV): Secondary | ICD-10-CM

## 2013-07-02 NOTE — Patient Instructions (Signed)
Place premenopausal annual exam patient instructions here.  °

## 2013-07-02 NOTE — Progress Notes (Signed)
Subjective:     [name] is a [age] y.o. female here for a routine exam.  Current complaints: irregular periods.  Pt is still frequently breastfeeding 709 month old son. Personal health questionnaire reviewed: not asked.   Gynecologic History Patient's last menstrual period was 06/25/2013. Contraception: condoms Last Pap: 2011. Results were: normal Last mammogram: NA. Results were: NA  Obstetric History OB History  Gravida Para Term Preterm AB SAB TAB Ectopic Multiple Living  16 7 6 1 9 8 1  0 0 7    # Outcome Date GA Lbr Len/2nd Weight Sex Delivery Anes PTL Lv  16 TRM 10/26/12 8478w4d / 00:09  M SVD EPI  Y  15 TRM 03/13/11 1153w0d 09:30 / 03:38 8 lb 14.3 oz (4.035 kg) M SVD EPI  Y  14 TRM 06/04/09 7816w0d  8 lb 3 oz (3.714 kg) F SVD None N Y     Comments: delivered on route to hosiptal.  Autistic, Dx w/ VSD 2013- [facility]  [facility] 1652[facility]1d  8 lb 7 oz (3.827 kg) M      12 TRM 02/09/04 773w3d  7 lb 13 oz (3.544 kg) F SVD None N Y  11 PRE 05/25/02   4 lb 13 oz (2.183 kg) F SVD EPI N Y  10 TAB 01/22/96          9 TRM 03/16/95 2495w1d  7 lb 3 oz (3.26 kg) F SVD EPI N Y     Comments: adopted  8 SAB           7 SAB           6 SAB           5 SAB           4 SAB           3 SAB           2 SAB           1 SAB                The following portions of the patient's history were reviewed and updated as appropriate: allergies, current medications, past family history, past medical history, past social history, past surgical history and problem list.   Review of Systems Genitourinary:negative for urinary incontinence Integument/breast: negative for breast lump, rash, skin color change and possitive for generalized right breast tenderness which pt attributes to breastfeeding (only nurses on right) Endocrine: negative for temperature intolerance and changes in hair, nails, energy level    Objective:    BP 124/80  Pulse 99  Resp 16  Ht 5\' 6"  (1.676 m)  Wt 258 lb (117.028 kg)   BMI 41.66 kg/m2  LMP 06/25/2013  Breastfeeding? Yes General appearance: alert, cooperative, appears stated age, mild distress and morbidly obese Head: Normocephalic, without obvious abnormality, atraumatic Neck: no adenopathy, supple, symmetrical, trachea midline and thyroid not enlarged, symmetric, no tenderness/mass/nodules Lungs: clear to auscultation bilaterally Breasts: normal appearance, no masses or tenderness, No nipple retraction or dimpling, No axillary or supraclavicular adenopathy, Taught monthly breast self examination, positive findings: scratches 5 cm border around right nipple (attributed to breasfeeding son) Heart: regular rate and rhythm, S1, S2 normal, no murmur, click, rub or gallop Abdomen: soft, non-tender; bowel sounds normal; no masses,  no organomegaly Pelvic: cervix normal in appearance, exam obscured by obesity, external genitalia normal, no adnexal masses or tenderness, no cervical motion tenderness, uterus normal size, shape, and consistency, vagina  normal without discharge and Retroverted uterus Extremities: extremities normal, atraumatic, no cyanosis or edema and Homans sign is negative, no sign of DVT Neurologic: Grossly normal. Alert and oriented x 4    Assessment:    Healthy female exam.  Routine gynecological examination - Plan: Cytology - PAP Screening for malignant neoplasm of cervix.  Plan:    Education reviewed: self breast exams. Contraception: condoms. Follow up in: 1 year.   If menstrual irregularities continue after breastfeeding stops recommend TSH.   [place] Maxie Slovacek, CNM 07/02/2013 9:41 AM

## 2013-07-05 LAB — CYTOLOGY - PAP

## 2013-10-01 ENCOUNTER — Ambulatory Visit (INDEPENDENT_AMBULATORY_CARE_PROVIDER_SITE_OTHER): Payer: BC Managed Care – PPO | Admitting: Family

## 2013-10-01 ENCOUNTER — Other Ambulatory Visit: Payer: Self-pay | Admitting: Family

## 2013-10-01 ENCOUNTER — Encounter: Payer: Self-pay | Admitting: Family

## 2013-10-01 VITALS — BP 98/60 | HR 84 | Wt 245.0 lb

## 2013-10-01 DIAGNOSIS — O09899 Supervision of other high risk pregnancies, unspecified trimester: Secondary | ICD-10-CM | POA: Insufficient documentation

## 2013-10-01 DIAGNOSIS — Z3481 Encounter for supervision of other normal pregnancy, first trimester: Secondary | ICD-10-CM

## 2013-10-01 DIAGNOSIS — Z3480 Encounter for supervision of other normal pregnancy, unspecified trimester: Secondary | ICD-10-CM

## 2013-10-01 MED ORDER — DOXYLAMINE-PYRIDOXINE 10-10 MG PO TBEC: DELAYED_RELEASE_TABLET | ORAL | Status: DC

## 2013-10-01 MED ORDER — PROGESTERONE 200 MG VA SUPP: 200.0000 mg | Freq: Every day | VAGINAL | Status: DC

## 2013-10-01 NOTE — Progress Notes (Signed)
Subjective:    [name] is a [id number] [redacted]w[redacted]d being seen today for her first obstetrical visit.  Her obstetrical history is significant for advanced maternal age and history of multiple SABs, and close spacing of pregnancies.    Surgical history updated:  Pt had adenoids, tonsils, and partial uvula removed due to history of smoking and abnormal appearance.  Biopsy negative.  Patient does intend to breast feed.  Currently breastfeeding at this time.  Pregnancy history fully reviewed.  Patient reports fatigue, nausea, no bleeding and no cramping.  Filed Vitals:   10/01/13 1001  BP: 98/60  Pulse: 84  Weight: 245 lb (111.131 kg)    HISTORY: OB History  Gravida Para Term Preterm AB SAB TAB Ectopic Multiple Living  0 0 7    # Outcome Date GA Lbr Len/2nd Weight Sex Delivery Anes PTL Lv  17 CUR           16 TRM 10/26/12 [redacted]w[redacted]d / 00:09  M SVD EPI  Y  15 TRM 03/13/11 [redacted]w[redacted]d 09:30 / 03:38 8 lb 14.3 oz (4.035 kg) M SVD EPI  Y  14 TRM 06/04/09 [redacted]w[redacted]d  8 lb 3 oz (3.714 kg) F SVD None N Y     Comments: delivered on route to hosiptal.  Autistic, Dx w/ VSD 2013- [facility]  13 [facility] 08/06/07 [redacted]w[redacted]d  8 lb 7 oz (3.827 kg) M      12 TRM 02/09/04 [redacted]w[redacted]d  7 lb 13 oz (3.544 kg) F SVD None N Y  11 PRE 05/25/02   4 lb 13 oz (2.183 kg) F SVD EPI N Y  10 TAB 01/22/96          9 TRM 03/16/95 [redacted]w[redacted]d  7 lb 3 oz (3.26 kg) F SVD EPI N Y     Comments: adopted  8 SAB           7 SAB           6 SAB           5 SAB           4 SAB           3 SAB           2 SAB           1 SAB              Past Medical History  Diagnosis Date  . Miscarriage     x 8  . ADHD (attention deficit hyperactivity disorder)     husband  . Chromosomal abnormality   . HSV (herpes simplex virus) anogenital infection   . Kidney stone on left side    Past Surgical History  Procedure Laterality Date  . Dilation and curettage of uterus  1998  . Arm surgery for fracture    . Knee surgery    . Wisdom tooth  extraction    . Tonsillectomy and adenoidectomy     Family History  Problem Relation Age of Onset  . Autism spectrum disorder Daughter   . Other Daughter     Pancreatic insufficiency.    Exam   BP 98/60  Pulse 84  Wt 245 lb (111.131 kg)  LMP 07/22/2013 Uterine Size: size equals dates  System: Breast:  No nipple retraction or dimpling, No nipple discharge or bleeding, No axillary or supraclavicular adenopathy, Normal to palpation without dominant masses   Skin: normal coloration and  turgor, no rashes    Neurologic: negative   Extremities: normal strength, tone, and muscle mass   HEENT neck supple with midline trachea and thyroid without masses   Mouth/Teeth mucous membranes moist, pharynx normal without lesions   Neck supple and no masses   Cardiovascular: regular rate and rhythm, no murmurs or gallops   Respiratory:  appears well, vitals normal, no respiratory distress, acyanotic, normal RR, neck free of mass or lymphadenopathy, chest clear, no wheezing, crepitations, rhonchi, normal symmetric air entry   Abdomen: soft, non-tender; bowel sounds normal; no masses,  no organomegaly   Urinary: urethral meatus normal     Assessment:    Pregnancy:   38 yo [id number] at [redacted]w[redacted]d wks IUP Advanced Maternal Age Hx of multiple early pregnancy loss Close Spacing of Pregnancies  Patient Active Problem List   Diagnosis Date Noted  . Ureteral calculus, left 04/12/2011  . 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 BURN, FOREARM 10/12/2010  . Obesity 10/01/2010  . HSV 10/03/2006  . ATTENTION DEFICIT, W/HYPERACTIVITY 10/29/2005  . 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 Prenatal vitamins. Problem list reviewed and updated. Genetic Screening discussed, desires cell free DNA, refer to MFM for genetic counseling.   Rx for Progesterone 200 mg suppository per vagina hs (per pt request) Follow up in 4 weeks.   Marlis Edelson 10/01/2013

## 2013-10-01 NOTE — Progress Notes (Signed)
Unsure of dates.  Had tonsillectomy 08/23/13 and had neg pregnancy test that date.  Bedside U/S showed IUP with FHT of 163 BPM  CRL 10.49mm

## 2013-10-02 LAB — OBSTETRIC PANEL
ANTIBODY SCREEN: NEGATIVE
BASOS ABS: 0 10*3/uL (ref 0.0–0.1)
BASOS PCT: 0 % (ref 0–1)
Eosinophils Absolute: 0.2 10*3/uL (ref 0.0–0.7)
Eosinophils Relative: 2 % (ref 0–5)
HCT: 35.9 % — ABNORMAL LOW (ref 36.0–46.0)
HEP B S AG: NEGATIVE
Hemoglobin: 11.9 g/dL — ABNORMAL LOW (ref 12.0–15.0)
Lymphocytes Relative: 23 % (ref 12–46)
Lymphs Abs: 2.1 10*3/uL (ref 0.7–4.0)
MCH: 26.8 pg (ref 26.0–34.0)
MCHC: 33.1 g/dL (ref 30.0–36.0)
MCV: 80.9 fL (ref 78.0–100.0)
Monocytes Absolute: 0.7 10*3/uL (ref 0.1–1.0)
Monocytes Relative: 7 % (ref 3–12)
NEUTROS ABS: 6.3 10*3/uL (ref 1.7–7.7)
Neutrophils Relative %: 68 % (ref 43–77)
Platelets: 375 10*3/uL (ref 150–400)
RBC: 4.44 MIL/uL (ref 3.87–5.11)
RDW: 14.5 % (ref 11.5–15.5)
Rh Type: POSITIVE
Rubella: 4.47 Index — ABNORMAL HIGH (ref <0.90)
WBC: 9.3 10*3/uL (ref 4.0–10.5)

## 2013-10-02 LAB — GC/CHLAMYDIA PROBE AMP
CT PROBE, AMP APTIMA: NEGATIVE
GC Probe RNA: NEGATIVE

## 2013-10-02 LAB — HIV ANTIBODY (ROUTINE TESTING W REFLEX): HIV 1&2 Ab, 4th Generation: NONREACTIVE

## 2013-10-03 LAB — CULTURE, URINE COMPREHENSIVE
Colony Count: NO GROWTH
ORGANISM ID, BACTERIA: NO GROWTH

## 2013-10-19 ENCOUNTER — Telehealth: Payer: Self-pay | Admitting: *Deleted

## 2013-10-19 DIAGNOSIS — B850 Pediculosis due to Pediculus humanus capitis: Secondary | ICD-10-CM

## 2013-10-19 MED ORDER — BENZYL ALCOHOL 5 % EX LOTN: TOPICAL_LOTION | CUTANEOUS | Status: DC

## 2013-10-19 NOTE — Telephone Encounter (Signed)
Pt called stating that all 5 of her children has head lice and wanted to know what she could use since she is pregnant.  Per Dr Imagene Richesove Ulesfia lotion may be called into her pharmamcy

## 2013-10-21 ENCOUNTER — Ambulatory Visit (INDEPENDENT_AMBULATORY_CARE_PROVIDER_SITE_OTHER): Payer: BC Managed Care – PPO | Admitting: Obstetrics & Gynecology

## 2013-10-21 ENCOUNTER — Encounter: Payer: Self-pay | Admitting: Obstetrics & Gynecology

## 2013-10-21 ENCOUNTER — Telehealth: Payer: Self-pay | Admitting: *Deleted

## 2013-10-21 VITALS — BP 126/75 | HR 104 | Wt 250.0 lb

## 2013-10-21 DIAGNOSIS — O021 Missed abortion: Secondary | ICD-10-CM

## 2013-10-21 MED ORDER — MISOPROSTOL 200 MCG PO TABS: 800.0000 ug | ORAL_TABLET | Freq: Once | ORAL | Status: DC

## 2013-10-21 MED ORDER — HYDROCODONE-ACETAMINOPHEN 5-325 MG PO TABS: ORAL_TABLET | ORAL | Status: DC

## 2013-10-21 NOTE — Telephone Encounter (Signed)
Pt called in stating she has started bleeding bright red blood. No cramping but feels as though she is losing the pregnancy. I adv pt to report to MAU for eval. Pt expressed understanding.

## 2013-10-22 NOTE — Progress Notes (Signed)
Pt presents c/o staining.  Pt has previous Fh on 7 week US.  Known history of balanced translocation and multiple miscarriages.   Bedside US shows no IUP.  Pt elects for cytotec.  Pt is not anemic.  Protocol initiated.

## 2013-10-24 ENCOUNTER — Inpatient Hospital Stay (HOSPITAL_COMMUNITY)
Admission: AD | Admit: 2013-10-24 | Discharge: 2013-10-24 | Disposition: A | Discharge status: Home / Self Care | Payer: BC Managed Care – PPO | Source: Ambulatory Visit | Attending: Obstetrics and Gynecology | Admitting: Obstetrics and Gynecology

## 2013-10-24 ENCOUNTER — Inpatient Hospital Stay (HOSPITAL_COMMUNITY): Payer: BC Managed Care – PPO

## 2013-10-24 ENCOUNTER — Encounter (HOSPITAL_COMMUNITY): Payer: Self-pay | Admitting: *Deleted

## 2013-10-24 DIAGNOSIS — O039 Complete or unspecified spontaneous abortion without complication: Secondary | ICD-10-CM | POA: Diagnosis not present

## 2013-10-24 DIAGNOSIS — O4691 Antepartum hemorrhage, unspecified, first trimester: Secondary | ICD-10-CM | POA: Diagnosis present

## 2013-10-24 DIAGNOSIS — Z87891 Personal history of nicotine dependence: Secondary | ICD-10-CM | POA: Diagnosis not present

## 2013-10-24 DIAGNOSIS — O09899 Supervision of other high risk pregnancies, unspecified trimester: Secondary | ICD-10-CM

## 2013-10-24 DIAGNOSIS — Z3A10 10 weeks gestation of pregnancy: Secondary | ICD-10-CM | POA: Diagnosis not present

## 2013-10-24 LAB — CBC WITH DIFFERENTIAL/PLATELET
BASOS ABS: 0 10*3/uL (ref 0.0–0.1)
Basophils Relative: 0 % (ref 0–1)
EOS ABS: 0.1 10*3/uL (ref 0.0–0.7)
Eosinophils Relative: 0 % (ref 0–5)
HCT: 31.7 % — ABNORMAL LOW (ref 36.0–46.0)
Hemoglobin: 10.6 g/dL — ABNORMAL LOW (ref 12.0–15.0)
LYMPHS ABS: 2.2 10*3/uL (ref 0.7–4.0)
Lymphocytes Relative: 16 % (ref 12–46)
MCH: 27.2 pg (ref 26.0–34.0)
MCHC: 33.4 g/dL (ref 30.0–36.0)
MCV: 81.3 fL (ref 78.0–100.0)
Monocytes Absolute: 0.6 10*3/uL (ref 0.1–1.0)
Monocytes Relative: 4 % (ref 3–12)
Neutro Abs: 11.1 10*3/uL — ABNORMAL HIGH (ref 1.7–7.7)
Neutrophils Relative %: 80 % — ABNORMAL HIGH (ref 43–77)
PLATELETS: 365 10*3/uL (ref 150–400)
RBC: 3.9 MIL/uL (ref 3.87–5.11)
RDW: 13.5 % (ref 11.5–15.5)
WBC: 14 10*3/uL — AB (ref 4.0–10.5)

## 2013-10-24 LAB — CBC
HCT: 25.7 % — ABNORMAL LOW (ref 36.0–46.0)
Hemoglobin: 8.7 g/dL — ABNORMAL LOW (ref 12.0–15.0)
MCH: 27.5 pg (ref 26.0–34.0)
MCHC: 33.9 g/dL (ref 30.0–36.0)
MCV: 81.3 fL (ref 78.0–100.0)
Platelets: 309 10*3/uL (ref 150–400)
RBC: 3.16 MIL/uL — ABNORMAL LOW (ref 3.87–5.11)
RDW: 13.4 % (ref 11.5–15.5)
WBC: 12.9 10*3/uL — ABNORMAL HIGH (ref 4.0–10.5)

## 2013-10-24 LAB — HCG, QUANTITATIVE, PREGNANCY: HCG, BETA CHAIN, QUANT, S: 1818 m[IU]/mL — AB (ref <5)

## 2013-10-24 MED ORDER — MISOPROSTOL 200 MCG PO TABS
800.0000 ug | ORAL_TABLET | Freq: Once | ORAL | Status: AC
  Administered 2013-10-24: 800 ug via ORAL
  Filled 2013-10-24: qty 4

## 2013-10-24 MED ORDER — OXYCODONE-ACETAMINOPHEN 5-325 MG PO TABS
1.0000 | ORAL_TABLET | ORAL | Status: AC
  Administered 2013-10-24: 1 via ORAL
  Filled 2013-10-24: qty 1

## 2013-10-24 MED ORDER — LACTATED RINGERS IV BOLUS (SEPSIS)
1000.0000 mL | Freq: Once | INTRAVENOUS | Status: AC
  Administered 2013-10-24: 1000 mL via INTRAVENOUS

## 2013-10-24 MED ORDER — OXYCODONE-ACETAMINOPHEN 5-325 MG PO TABS
1.0000 | ORAL_TABLET | ORAL | Status: AC
Start: 2013-10-24 — End: 2013-10-24
  Administered 2013-10-24: 1 via ORAL
  Filled 2013-10-24: qty 1

## 2013-10-24 MED ORDER — FERROUS SULFATE 325 (65 FE) MG PO TABS: 325.0000 mg | ORAL_TABLET | Freq: Three times a day (TID) | ORAL | Status: DC

## 2013-10-24 MED ORDER — OXYCODONE-ACETAMINOPHEN 5-325 MG PO TABS: 1.0000 | ORAL_TABLET | ORAL | Status: DC | PRN

## 2013-10-24 NOTE — Discharge Instructions (Signed)
Miscarriage A miscarriage is the sudden loss of an unborn baby (fetus) before the 20th week of pregnancy. Most miscarriages happen in the first 3 months of pregnancy. Sometimes, it happens before a woman even knows she is pregnant. A miscarriage is also called a "spontaneous miscarriage" or "early pregnancy loss." Having a miscarriage can be an emotional experience. Talk with your caregiver about any questions you may have about miscarrying, the grieving process, and your future pregnancy plans. CAUSES   Problems with the fetal chromosomes that make it impossible for the baby to develop normally. Problems with the baby's genes or chromosomes are most often the result of errors that occur, by chance, as the embryo divides and grows. The problems are not inherited from the parents.  Infection of the cervix or uterus.   Hormone problems.   Problems with the cervix, such as having an incompetent cervix. This is when the tissue in the cervix is not strong enough to hold the pregnancy.   Problems with the uterus, such as an abnormally shaped uterus, uterine fibroids, or congenital abnormalities.   Certain medical conditions.   Smoking, drinking alcohol, or taking illegal drugs.   Trauma.  Often, the cause of a miscarriage is unknown.  SYMPTOMS   Vaginal bleeding or spotting, with or without cramps or pain.  Pain or cramping in the abdomen or lower back.  Passing fluid, tissue, or blood clots from the vagina. DIAGNOSIS  Your caregiver will perform a physical exam. You may also have an ultrasound to confirm the miscarriage. Blood or urine tests may also be ordered. TREATMENT   Sometimes, treatment is not necessary if you naturally pass all the fetal tissue that was in the uterus. If some of the fetus or placenta remains in the body (incomplete miscarriage), tissue left behind may become infected and must be removed. Usually, a dilation and curettage (D and C) procedure is performed.  During a D and C procedure, the cervix is widened (dilated) and any remaining fetal or placental tissue is gently removed from the uterus.  Antibiotic medicines are prescribed if there is an infection. Other medicines may be given to reduce the size of the uterus (contract) if there is a lot of bleeding.  If you have Rh negative blood and your baby was Rh positive, you will need a Rh immunoglobulin shot. This shot will protect any future baby from having Rh blood problems in future pregnancies. HOME CARE INSTRUCTIONS   Your caregiver may order bed rest or may allow you to continue light activity. Resume activity as directed by your caregiver.  Have someone help with home and family responsibilities during this time.   Keep track of the number of sanitary pads you use each day and how soaked (saturated) they are. Write down this information.   Do not use tampons. Do not douche or have sexual intercourse until approved by your caregiver.   Only take over-the-counter or prescription medicines for pain or discomfort as directed by your caregiver.   Do not take aspirin. Aspirin can cause bleeding.   Keep all follow-up appointments with your caregiver.   If you or your partner have problems with grieving, talk to your caregiver or seek counseling to help cope with the pregnancy loss. Allow enough time to grieve before trying to get pregnant again.  SEEK IMMEDIATE MEDICAL CARE IF:   You have severe cramps or pain in your back or abdomen.  You have a fever.  You pass large blood clots (walnut-sized   or larger) ortissue from your vagina. Save any tissue for your caregiver to inspect.   Your bleeding increases.   You have a thick, bad-smelling vaginal discharge.  You become lightheaded, weak, or you faint.   You have chills.  MAKE SURE YOU:  Understand these instructions.  Will watch your condition.  Will get help right away if you are not doing well or get  worse. Document Released: 07/03/2000 Document Revised: 05/04/2012 Document Reviewed: 02/26/2011 Exit[employer] Patient Information 2015 [employer]. This information is not intended to replace advice given to you by your health care provider. Make sure you discuss any questions you have with your health care provider.  

## 2013-10-24 NOTE — MAU Note (Signed)
Pt discharged and getting up to go home and began to feel weak, dizzy and feeling faint. Pt. Back in bed, BP taken and fluid po given.

## 2013-10-24 NOTE — MAU Note (Signed)
Bleeding started 3 hours ago, passing clots, cramping.

## 2013-10-24 NOTE — Progress Notes (Signed)
Bedside US at time of office visit shows fetal pole with no heart beat measuring approximately 8 weeks.

## 2013-10-24 NOTE — MAU Provider Note (Signed)
History     CSN: [id number]  Arrival date and time: 10/24/13 1606   First Provider Initiated Contact with Patient 10/24/13 1623      Chief Complaint  Patient presents with  . Vaginal Bleeding   HPI [name] 38 y.o. [id number] @[redacted]w[redacted]d  presents to MAU complaining of having a miscarriage.  She was having pain for several days and was seen 10/1 at [facility] by Dr. Penne Lash.  She began having increased pain 8/10 with regular contractions this morning.  She took 2 Vicodin at 11am today.  Her contractions stopped and then she began to have heavy bleeding passing large clots and soaking many pads.  When she began to feel weak and decided to come in.  She has a known chromosomal problem that has resulted in miscarriages in the past.   OB History   Grav Para Term Preterm Abortions TAB SAB Ect Mult Living   17 7 6 1 9 1 8  0 0 7      Past Medical History  Diagnosis Date  . Miscarriage     x 8  . ADHD (attention deficit hyperactivity disorder)     husband  . Chromosomal abnormality   . HSV (herpes simplex virus) anogenital infection   . Kidney stone on left side     Past Surgical History  Procedure Laterality Date  . Dilation and curettage of uterus  1998  . Arm surgery for fracture    . Knee surgery    . Wisdom tooth extraction    . Tonsillectomy and adenoidectomy      Family History  Problem Relation Age of Onset  . Autism spectrum disorder Daughter   . Other Daughter     Pancreatic insufficiency.     History  Substance Use Topics  . Smoking status: Former Games developer  . Smokeless tobacco: Not on file  . Alcohol Use: No    Allergies: No Known Allergies  Prescriptions prior to admission  Medication Sig Dispense Refill  . Benzyl Alcohol 5 % LOTN Use as directed for head lice only  1 Bottle  1  . Doxylamine-Pyridoxine (DICLEGIS) 10-10 MG TBEC Take two at bedtime, if no improvement add another one in the morning.  If still no improvement then add another one in the  afternoon.  100 tablet  1  . HYDROcodone-acetaminophen (NORCO) 5-325 MG per tablet Take 1-2 tablets by mouth every 4 hours as needed for pain  10 tablet  0  . misoprostol (CYTOTEC) 200 MCG tablet Place 4 tablets (800 mcg total) vaginally once. Insert four tablets vaginally the night prior to your appointment  4 tablet  1  . ondansetron (ZOFRAN) 8 MG tablet       . Prenatal Multivit-Min-Fe-FA (PRENATAL VITAMINS PO) Take by mouth daily.      . progesterone 200 MG SUPP Place 1 suppository (200 mg total) vaginally at bedtime.  30 each  1    Review of Systems  Constitutional: Positive for chills and diaphoresis. Negative for fever.  HENT: Negative for congestion and sore throat.   Eyes: Negative for blurred vision and double vision.  Respiratory: Positive for shortness of breath. Negative for cough and wheezing.   Cardiovascular: Negative for chest pain and palpitations.  Gastrointestinal: Positive for nausea and abdominal pain. Negative for heartburn, vomiting, diarrhea and constipation.  Genitourinary: Negative for dysuria, frequency and hematuria.  Musculoskeletal: Negative for back pain and neck pain.  Skin: Negative for itching and rash.  Neurological: Positive for dizziness, tingling  and weakness. Negative for headaches.  Psychiatric/Behavioral: Negative for depression, suicidal ideas and substance abuse. The patient is not nervous/anxious.    Physical Exam   Blood pressure 161/107, pulse 89, temperature 98 F (36.7 C), temperature source Oral, resp. rate 18, last menstrual period 07/22/2013, currently breastfeeding.  Physical Exam  Constitutional: She appears well-developed and well-nourished. No distress.  HENT:  Head: Normocephalic and atraumatic.  Eyes: EOM are normal.  Neck: Normal range of motion.  Cardiovascular: Normal rate, regular rhythm and normal heart sounds.   Respiratory: Effort normal and breath sounds normal.  GI: Soft. She exhibits no distension. There is no  tenderness.  Genitourinary:  Copious bleeding.  Products visible in vagina and removed with some gentle traction.  This was collected to be sent for pathology.   No further products seen in cervix.Cervix is open 1 cm.   Active bleeding slowed briefly and then resumed.      Pt had another episode of gush of bleeding with passage of what appears to be further POC.  THis was collected and sent for pathology.     MAU Course  Procedures  MDM IV started with LR.  POC appear to have passed throughout her time in MAU.  Bedside ultrasound during time of bleeding suggested retained POC.  Cytotec given buccally.  Further passage of blood clots and suspected POC..  Pain controlled with Percocet.  IV hydration with 2 Liters of LR.   Care turned over to Thressa ShellerHeather Hogan, CNM at 8:09pm. 2229: Patient felt dizzy when she tried to leave. Orthostatic vitals signs stable. Hgb is decreased from before (10.6 >> 8.7) Will start iron supplementation.   Assessment and Plan  A: Spontaneous abortion  P: Discharge to home [facility] Return to MAU for emergency  Bertram Denvereague Clark, Karen E 10/24/2013, 4:27 PM

## 2013-10-24 NOTE — MAU Note (Signed)
Pt in side lying semi fowlers position and feeling better however states that she is still not feeling well and has some tingling in fingers.

## 2013-10-25 ENCOUNTER — Telehealth: Payer: Self-pay | Admitting: *Deleted

## 2013-10-25 ENCOUNTER — Telehealth (HOSPITAL_COMMUNITY): Payer: Self-pay | Admitting: *Deleted

## 2013-10-25 NOTE — Telephone Encounter (Signed)
Called pt to check on her after her visit to MAU for SAB.  Pt started to cry stating that she was so weak from her blood loss that she couldn't do anything.  She stated she didn't understand why they didn't give her any blood.  i explained that hgb of 8.7 was not the protocol for a blood transfusion.  I encouraged her to please take her FeSo4 TID as ordered.  She stated that she is experiencing dizziness so bad that she cannot move around in the house.  She is tearful as she is home with all 6 kids and husband is at work.  Pt states that she does have some Meclizine and she will try and take that.  Encouraged pt to call if we could help.  She states that her bleeding today is like a normal cycle.  I will call her tomorrow to check on her.

## 2013-10-25 NOTE — MAU Provider Note (Signed)
Attestation of Attending Supervision of Advanced Practitioner (CNM/NP): Evaluation and management procedures were performed by the Advanced Practitioner under my supervision and collaboration.  I have reviewed the Advanced Practitioner's note and chart, and I agree with the management and plan.  Shania Bjelland 10/25/2013 2:59 AM   

## 2013-10-28 ENCOUNTER — Telehealth: Payer: Self-pay | Admitting: *Deleted

## 2013-10-28 NOTE — Telephone Encounter (Signed)
[place] [facility] [place] [facility]'s hgb today was 9.7.  This is up from the weekend when it was 8.7.  She is taking FeSo4 TID.  Nurse stated that anytime it is below 10 they are to contact the providers office.

## 2013-10-29 ENCOUNTER — Encounter: Payer: BC Managed Care – PPO | Admitting: Family

## 2013-11-22 ENCOUNTER — Encounter (HOSPITAL_COMMUNITY): Payer: Self-pay | Admitting: *Deleted

## 2014-01-21 DIAGNOSIS — I8290 Acute embolism and thrombosis of unspecified vein: Secondary | ICD-10-CM

## 2014-01-21 HISTORY — DX: Acute embolism and thrombosis of unspecified vein: I82.90

## 2014-08-06 ENCOUNTER — Encounter (HOSPITAL_COMMUNITY): Payer: Self-pay | Admitting: *Deleted

## 2014-09-15 ENCOUNTER — Encounter: Payer: Self-pay | Admitting: Family Medicine

## 2014-09-15 ENCOUNTER — Ambulatory Visit (INDEPENDENT_AMBULATORY_CARE_PROVIDER_SITE_OTHER): Payer: Medicaid Other | Admitting: Family Medicine

## 2014-09-15 VITALS — BP 124/75 | HR 96 | Wt 268.0 lb

## 2014-09-15 DIAGNOSIS — R091 Pleurisy: Secondary | ICD-10-CM

## 2014-09-15 NOTE — Progress Notes (Signed)
CC: [name] is a 39 y.o. female is here for rib pain?   Subjective: HPI:  Patient is currently just over [redacted] weeks pregnant  Past 3 days patient has noticed some postnasal drip, and some chest discomfort. She is describing the pain as a sharpness that was initially in the right upper quadrant and now feels like it's involving the entire anterior chest wall. It's present when taking deep breaths or when breathing quickly while walking fast. Other than that there is no clear exertional component to this. She woke up this morning it was absent, as the day goes on it seems to be more noticeable. It's mild in severity at its worst. She denies any rapid heartbeat, shortness of breath, nor cough. She denies any swelling of the appendages or irregular heartbeat. She's had some nasal congestion ever since these symptoms began. She denies any wheezing, fevers, chills or headache.  She wants to talk about a family history of colon cancer. Before he could cut her off she went me know that her biological mother was diagnosed with colon cancer in her 30s and then had returned later in the 31s to cause death. She wants to know when she should start getting colonoscopies.  Review Of Systems Outlined In HPI  Past Medical History  Diagnosis Date  . Miscarriage     x 8  . ADHD (attention deficit hyperactivity disorder)     husband  . Chromosomal abnormality   . HSV (herpes simplex virus) anogenital infection   . Kidney stone on left side     Past Surgical History  Procedure Laterality Date  . Dilation and curettage of uterus  1998  . Arm surgery for fracture    . Knee surgery    . Wisdom tooth extraction    . Tonsillectomy and adenoidectomy     Family History  Problem Relation Age of Onset  . Autism spectrum disorder Daughter   . Other Daughter     Pancreatic insufficiency.     Social History   Social History  . Marital Status: Married    Spouse Name: [name]  . Number of Children: 4   . Years of Education: college   Occupational History  . Homemaker    Social History Main Topics  . Smoking status: Former Games developer  . Smokeless tobacco: Not on file  . Alcohol Use: No  . Drug Use: No  . Sexual Activity:    Partners: Male   Other Topics Concern  . Not on file   Social History Narrative     Objective: BP 124/75 mmHg  Pulse 96  Wt 268 lb (121.564 kg)  SpO2 97%  General: Alert and Oriented, No Acute Distress HEENT: Pupils equal, round, reactive to light. Conjunctivae clear.  External ears unremarkable, canals clear with intact TMs with appropriate landmarks.  Middle ear appears open without effusion. Pink inferior turbinates.  Moist mucous membranes, pharynx without inflammation nor lesions.  Neck supple without palpable lymphadenopathy nor abnormal masses. Lungs: Clear to auscultation bilaterally, no wheezing/ronchi/rales.  Comfortable work of breathing. Good air movement. Cardiac: Regular rate and rhythm. Normal S1/S2.  No murmurs, rubs, nor gallops.   Extremities: No peripheral edema.  Strong peripheral pulses.  Mental Status: No depression, anxiety, nor agitation. Skin: Warm and dry.  Assessment & Plan: [name] was seen today for rib pain?.  Diagnoses and all orders for this visit:  Pleurisy   Symptoms are most likely due to pleurisy from a resolving viral URI.  Discussed 500 mg of Tylenol every 6 hours on an as-needed basis.Signs and symptoms requring emergent/urgent reevaluation were discussed with the patient. Discussed signs and symptoms that would be some more suggestive of pneumonia, pneumothorax, PE, cardiac chest pain. Discussed that if her biologic mother had colon cancer in her 30s the patient should probably start getting screening colonoscopies after she is delivered and recovered from the delivery. I've asked her to contact me or her PCP if she needs referrals to a local gastroenterologist.  25 minutes spent face-to-face during visit today of  which at least 50% was counseling or coordinating care regarding: 1. Pleurisy        Return if symptoms worsen or fail to improve.

## 2014-12-23 ENCOUNTER — Ambulatory Visit (INDEPENDENT_AMBULATORY_CARE_PROVIDER_SITE_OTHER): Payer: BLUE CROSS/BLUE SHIELD | Admitting: Family Medicine

## 2014-12-23 DIAGNOSIS — Z23 Encounter for immunization: Secondary | ICD-10-CM | POA: Diagnosis not present

## 2015-01-06 ENCOUNTER — Encounter (HOSPITAL_COMMUNITY): Payer: Self-pay

## 2015-01-06 ENCOUNTER — Emergency Department (HOSPITAL_COMMUNITY)
Admission: EM | Admit: 2015-01-06 | Discharge: 2015-01-06 | Disposition: A | Discharge status: Home / Self Care | Payer: BLUE CROSS/BLUE SHIELD | Attending: Emergency Medicine | Admitting: Emergency Medicine

## 2015-01-06 ENCOUNTER — Emergency Department (EMERGENCY_DEPARTMENT_HOSPITAL): Payer: BLUE CROSS/BLUE SHIELD

## 2015-01-06 ENCOUNTER — Emergency Department (HOSPITAL_COMMUNITY): Payer: BLUE CROSS/BLUE SHIELD

## 2015-01-06 DIAGNOSIS — Q999 Chromosomal abnormality, unspecified: Secondary | ICD-10-CM | POA: Diagnosis not present

## 2015-01-06 DIAGNOSIS — M7989 Other specified soft tissue disorders: Secondary | ICD-10-CM | POA: Diagnosis not present

## 2015-01-06 DIAGNOSIS — Z79899 Other long term (current) drug therapy: Secondary | ICD-10-CM | POA: Insufficient documentation

## 2015-01-06 DIAGNOSIS — R791 Abnormal coagulation profile: Secondary | ICD-10-CM | POA: Insufficient documentation

## 2015-01-06 DIAGNOSIS — G4452 New daily persistent headache (NDPH): Secondary | ICD-10-CM | POA: Insufficient documentation

## 2015-01-06 DIAGNOSIS — R7989 Other specified abnormal findings of blood chemistry: Secondary | ICD-10-CM

## 2015-01-06 DIAGNOSIS — Z8659 Personal history of other mental and behavioral disorders: Secondary | ICD-10-CM | POA: Insufficient documentation

## 2015-01-06 DIAGNOSIS — Z8619 Personal history of other infectious and parasitic diseases: Secondary | ICD-10-CM | POA: Diagnosis not present

## 2015-01-06 DIAGNOSIS — R519 Headache, unspecified: Secondary | ICD-10-CM

## 2015-01-06 DIAGNOSIS — Z87442 Personal history of urinary calculi: Secondary | ICD-10-CM | POA: Insufficient documentation

## 2015-01-06 DIAGNOSIS — R51 Headache: Secondary | ICD-10-CM

## 2015-01-06 DIAGNOSIS — Z87891 Personal history of nicotine dependence: Secondary | ICD-10-CM | POA: Insufficient documentation

## 2015-01-06 LAB — COMPREHENSIVE METABOLIC PANEL
ALT: 15 U/L (ref 14–54)
ANION GAP: 9 (ref 5–15)
AST: 14 U/L — ABNORMAL LOW (ref 15–41)
Albumin: 3.2 g/dL — ABNORMAL LOW (ref 3.5–5.0)
Alkaline Phosphatase: 71 U/L (ref 38–126)
BUN: 11 mg/dL (ref 6–20)
CALCIUM: 9.1 mg/dL (ref 8.9–10.3)
CO2: 26 mmol/L (ref 22–32)
Chloride: 107 mmol/L (ref 101–111)
Creatinine, Ser: 0.78 mg/dL (ref 0.44–1.00)
GFR calc Af Amer: >60 mL/min (ref >60)
GFR calc non Af Amer: >60 mL/min (ref >60)
Glucose, Bld: 92 mg/dL (ref 65–99)
POTASSIUM: 4.2 mmol/L (ref 3.5–5.1)
Sodium: 142 mmol/L (ref 135–145)
Total Bilirubin: 0.5 mg/dL (ref 0.3–1.2)
Total Protein: 7 g/dL (ref 6.5–8.1)

## 2015-01-06 LAB — CBC WITH DIFFERENTIAL/PLATELET
BASOS ABS: 0.1 10*3/uL (ref 0.0–0.1)
BASOS PCT: 1 %
Eosinophils Absolute: 0.3 10*3/uL (ref 0.0–0.7)
Eosinophils Relative: 3 %
HEMATOCRIT: 36.5 % (ref 36.0–46.0)
HEMOGLOBIN: 11.8 g/dL — AB (ref 12.0–15.0)
Lymphocytes Relative: 30 %
Lymphs Abs: 3.1 10*3/uL (ref 0.7–4.0)
MCH: 27.3 pg (ref 26.0–34.0)
MCHC: 32.3 g/dL (ref 30.0–36.0)
MCV: 84.5 fL (ref 78.0–100.0)
Monocytes Absolute: 0.7 10*3/uL (ref 0.1–1.0)
Monocytes Relative: 7 %
NEUTROS ABS: 6.1 10*3/uL (ref 1.7–7.7)
NEUTROS PCT: 59 %
Platelets: 357 10*3/uL (ref 150–400)
RBC: 4.32 MIL/uL (ref 3.87–5.11)
RDW: 14.3 % (ref 11.5–15.5)
WBC: 10.3 10*3/uL (ref 4.0–10.5)

## 2015-01-06 MED ORDER — GADOBENATE DIMEGLUMINE 529 MG/ML IV SOLN
20.0000 mL | Freq: Once | INTRAVENOUS | Status: AC | PRN
  Administered 2015-01-06: 20 mL via INTRAVENOUS

## 2015-01-06 NOTE — ED Provider Notes (Signed)
CSN: [id number]     Arrival date & time 01/06/15  1049 History   First MD Initiated Contact with Patient 01/06/15 1052     Chief Complaint  Patient presents with  . Leg Swelling     (Consider location/radiation/quality/duration/timing/severity/associated sxs/prior Treatment) Patient is a 39 y.o. female presenting with headaches. The history is provided by the patient.  Headache Pain location:  Frontal Quality:  Dull Radiates to:  Does not radiate Severity currently:  1/10 Severity at highest:  9/10 Onset quality:  Gradual Duration:  3 weeks Timing:  Constant Progression:  Unchanged Chronicity:  New Similar to prior headaches: no   Context: not activity and not exposure to bright light   Context comment:  Upon waking Relieved by:  Nothing Worsened by:  Nothing Ineffective treatments:  None tried Associated symptoms: no blurred vision, no congestion, no dizziness, no fever, no myalgias, no nausea, no near-syncope, no numbness, no tingling, no URI and no vomiting    39 yo F With a chief complaint of a headache. Patient just delivered a baby about 3 weeks ago. Patient since then has been having morning headaches usually resolve throughout the day. Nothing seems to make these better or worse. Patient also is having some right-sided vaginal pain which she went to go see her midwife who did an I&D taking it was a Bartholin's glands abscess however turned out to be hemorrhagic. They're concerned that this was worsening blood clots and so they obtained a d-dimer which was elevated at 1. They then center to the hospital for further evaluation. Patient has been complaining of right lower extremity swelling for about the same 3 weeks. Denies history of prior blood clots. Does have a history of family genetic predisposition to homocystinemia.  Denies other recent surgeries or history of cancer. Denies chest pain or shortness of breath.   Past Medical History  Diagnosis Date  . Miscarriage    x 8  . ADHD (attention deficit hyperactivity disorder)     husband  . Chromosomal abnormality   . HSV (herpes simplex virus) anogenital infection   . Kidney stone on left side    Past Surgical History  Procedure Laterality Date  . Dilation and curettage of uterus  1998  . Arm surgery for fracture    . Knee surgery    . Wisdom tooth extraction    . Tonsillectomy and adenoidectomy     Family History  Problem Relation Age of Onset  . Autism spectrum disorder Daughter   . Other Daughter     Pancreatic insufficiency.    Social History  Substance Use Topics  . Smoking status: Former Games developermoker  . Smokeless tobacco: None  . Alcohol Use: No   OB History    Gravida Para Term Preterm AB TAB SAB Ectopic Multiple Living   17 7 6 1 9 1 8  0 0 7     Review of Systems  Constitutional: Negative for fever and chills.  HENT: Negative for congestion and rhinorrhea.   Eyes: Negative for blurred vision, redness and visual disturbance.  Respiratory: Negative for shortness of breath and wheezing.   Cardiovascular: Positive for leg swelling (Only R leg). Negative for chest pain, palpitations and near-syncope.  Gastrointestinal: Negative for nausea and vomiting.  Genitourinary: Negative for dysuria and urgency.  Musculoskeletal: Negative for myalgias and arthralgias.  Skin: Negative for pallor and wound.  Neurological: Positive for headaches (worst in the morning). Negative for dizziness and numbness.      Allergies  Review of patient's allergies indicates no known allergies.  Home Medications   Prior to Admission medications   Medication Sig Start Date End Date Taking? Authorizing Provider  ibuprofen (ADVIL,MOTRIN) 200 MG tablet Take 200 mg by mouth every 6 (six) hours as needed.   Yes Historical Provider, MD  Prenatal Multivit-Min-Fe-FA (PRENATAL VITAMINS PO) Take 1 tablet by mouth daily.    Yes Historical Provider, MD   BP 121/74 mmHg  Pulse 88  Temp(Src) 98.4 F (36.9 C) (Oral)   Resp 16  Ht  (1.676 m)  Wt 265 lb (120.203 kg)  BMI 42.79 kg/m2  SpO2 99% Physical Exam  Constitutional: She is oriented to person, place, and time. She appears well-developed and well-nourished. No distress.  HENT:  Head: Normocephalic and atraumatic.  Eyes: EOM are normal. Pupils are equal, round, and reactive to light.  Neck: Normal range of motion. Neck supple.  Cardiovascular: Normal rate and regular rhythm.  Exam reveals no gallop and no friction rub.   No murmur heard. Pulmonary/Chest: Effort normal. She has no wheezes. She has no rales.  Abdominal: Soft. She exhibits no distension. There is no tenderness. There is no rebound and no guarding.  Musculoskeletal: She exhibits no edema or tenderness.  No appreciable edema.  PMS intact distally  Neurological: She is alert and oriented to person, place, and time. She has normal strength. No cranial nerve deficit or sensory deficit. Coordination normal. GCS eye subscore is 4. GCS verbal subscore is 5. GCS motor subscore is 6. She displays no Babinski's sign on the right side. She displays no Babinski's sign on the left side.  Reflex Scores:      Tricep reflexes are 2+ on the right side and 2+ on the left side.      Bicep reflexes are 2+ on the right side and 2+ on the left side.      Brachioradialis reflexes are 2+ on the right side and 2+ on the left side.      Patellar reflexes are 2+ on the right side and 2+ on the left side.      Achilles reflexes are 2+ on the right side and 2+ on the left side. Skin: Skin is warm and dry. She is not diaphoretic.  Psychiatric: She has a normal mood and affect. Her behavior is normal.  Nursing note and vitals reviewed.   ED Course  Procedures (including critical care time) Labs Review Labs Reviewed  COMPREHENSIVE METABOLIC PANEL - Abnormal; Notable for the following:    Albumin 3.2 (*)    AST 14 (*)    All other components within normal limits  CBC WITH DIFFERENTIAL/PLATELET - Abnormal;  Notable for the following:    Hemoglobin 11.8 (*)    All other components within normal limits    Imaging Review Mr Lodema Pilot Contrast  01/06/2015  CLINICAL DATA:  Post pregnancy morning headaches. Concern for dural venous sinus thrombosis. Uncomplicated delivery 3 weeks ago. EXAM: MRI HEAD WITHOUT AND WITH CONTRAST TECHNIQUE: Multiplanar, multiecho pulse sequences of the brain and surrounding structures were obtained without and with intravenous contrast. CONTRAST:  20mL MULTIHANCE GADOBENATE DIMEGLUMINE 529 MG/ML IV SOLN COMPARISON:  None. FINDINGS: The cerebellar tonsils project at or minimally below the foramen magnum, within normal limits. There is no evidence of acute infarct, acute intracranial hemorrhage, mass, midline shift, or extra-axial fluid collection. There is a 4 x 2 mm focus of enhancement and susceptibility artifact in the right lateral pons without precontrast T1 or  T2 correlate. The brain is otherwise normal in signal. The dural venous sinuses enhance normally without evidence of thrombosis. The pituitary gland is within normal limits. Orbits are unremarkable. A trace right mastoid effusion is noted. Paranasal sinuses are clear. Major intracranial vascular flow voids are preserved. IMPRESSION: 1. 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 2. Otherwise unremarkable appearance of the brain. 3. No evidence of dural venous sinus thrombosis. Electronically Signed   By: Sebastian Ache M.D.   On: 01/06/2015 15:25   I have personally reviewed and evaluated these images and lab results as part of my medical decision-making.   EKG Interpretation   Date/Time:  Friday January 06 2015 11:00:28 EST Ventricular Rate:  79 PR Interval:  152 QRS Duration: 81 QT Interval:  379 QTC Calculation: 434 R Axis:   67 Text Interpretation:  Sinus rhythm No old tracing to compare Confirmed by  Zuleica Seith MD, DANIEL [phone])  on 01/06/2015 12:15:40 PM      MDM   Final diagnoses:  Right leg swelling  Elevated d-dimer  Persistent headaches    39 yo F with a chief complaint of a headache. There are multiple red flags for this with worsening morning as well as immediately post pregnancy. Concern for possible venous sinus thrombosis with elevated d-dimer and outside hospital. Will obtain a MRI with and without contrast of the brain as well as a vascular lab study of the right lower extremity.  MRI of the brain negative. Patient with a negative vascular lab study of the right lower extremity. We'll discharge the patient home. Follow-up with her family doctor.  Tylenol and motrin for headache.   3:52 PM:  I have discussed the diagnosis/risks/treatment options with the patient and family and believe the pt to be eligible for discharge home to follow-up with PCP. We also discussed returning to the ED immediately if new or worsening sx occur. We discussed the sx which are most concerning (e.g., sudden worsening pain, fever, inability to tolerate by mouth) that necessitate immediate return. Medications administered to the patient during their visit and any new prescriptions provided to the patient are listed below.  Medications given during this visit Medications  gadobenate dimeglumine (MULTIHANCE) injection 20 mL (20 mLs Intravenous Contrast Given 01/06/15 1509)    New Prescriptions   No medications on file    The patient appears reasonably screen and/or stabilized for discharge and I doubt any other medical condition or other [facility] requiring further screening, evaluation, or treatment in the ED at this time prior to discharge.    Melene Plan, DO 01/06/15 [phone]

## 2015-01-06 NOTE — Progress Notes (Signed)
VASCULAR LAB PRELIMINARY  PRELIMINARY  PRELIMINARY  PRELIMINARY  Right lower extremity venous duplex completed.    Preliminary report:  Right:  No evidence of DVT, superficial thrombosis, or Baker's cyst.  Keayra Graham, RVS 01/06/2015, 1:13 PM

## 2015-01-06 NOTE — ED Notes (Signed)
Per PT, Pt is coming from home. Pt had an uncomplicated delivery three weeks ago. Pt reports headache starting after delivery that has not decreased. Starting yesterday, patient noticed a mass in vagina and was able to follow-up with mid-wife. Mid-Wife found to be what they thought was a cyst. During removal, provider found that mass was a blood clot. Pt had some removed and then provider stitched pt. Pt had blood work drawn and was called today and stated that D-Dimer was elevated and she needed to come to the hospital. Pt had noticed some increase in swelling and spiderveins in the right leg with slight pain in the right calf. Pt reports some intermittent right sharp back pain. Pt is alert and oriented x4 and reports pain in vagina region due to stitches.

## 2015-01-06 NOTE — ED Notes (Signed)
Birthing center called and advised patient had a labial tear and removed a blood clot yesterday.

## 2015-01-06 NOTE — ED Notes (Signed)
Phlebotomy at the bedside  

## 2015-01-06 NOTE — Discharge Instructions (Signed)
Take 4 over the counter ibuprofen tablets 3 times a day or 2 over-the-counter naproxen tablets twice a day for pain.  General Headache Without Cause A headache is pain or discomfort felt around the head or neck area. The specific cause of a headache may not be found. There are many causes and types of headaches. A few common ones are:  Tension headaches.  Migraine headaches.  Cluster headaches.  Chronic daily headaches. HOME CARE INSTRUCTIONS  Watch your condition for any changes. Take these steps to help with your condition: Managing Pain  Take over-the-counter and prescription medicines only as told by your health care provider.  Lie down in a dark, quiet room when you have a headache.  If directed, apply ice to the head and neck area:  Put ice in a plastic bag.  Place a towel between your skin and the bag.  Leave the ice on for 20 minutes, 2-3 times per day.  Use a heating pad or hot shower to apply heat to the head and neck area as told by your health care provider.  Keep lights dim if bright lights bother you or make your headaches worse. Eating and Drinking  Eat meals on a regular schedule.  Limit alcohol use.  Decrease the amount of caffeine you drink, or stop drinking caffeine. General Instructions  Keep all follow-up visits as told by your health care provider. This is important.  Keep a headache journal to help find out what may trigger your headaches. For example, write down:  What you eat and drink.  How much sleep you get.  Any change to your diet or medicines.  Try massage or other relaxation techniques.  Limit stress.  Sit up straight, and do not tense your muscles.  Do not use tobacco products, including cigarettes, chewing tobacco, or e-cigarettes. If you need help quitting, ask your health care provider.  Exercise regularly as told by your health care provider.  Sleep on a regular schedule. Get 7-9 hours of sleep, or the amount recommended  by your health care provider. SEEK MEDICAL CARE IF:   Your symptoms are not helped by medicine.  You have a headache that is different from the usual headache.  You have nausea or you vomit.  You have a fever. SEEK IMMEDIATE MEDICAL CARE IF:   Your headache becomes severe.  You have repeated vomiting.  You have a stiff neck.  You have a loss of vision.  You have problems with speech.  You have pain in the eye or ear.  You have muscular weakness or loss of muscle control.  You lose your balance or have trouble walking.  You feel faint or pass out.  You have confusion.   This information is not intended to replace advice given to you by your health care provider. Make sure you discuss any questions you have with your health care provider.   Document Released: 01/07/2005 Document Revised: 09/28/2014 Document Reviewed: 05/02/2014 Elsevier Interactive Patient Education [employer]2016 [employer]

## 2015-06-30 ENCOUNTER — Ambulatory Visit: Payer: BLUE CROSS/BLUE SHIELD | Admitting: Family Medicine

## 2015-08-01 ENCOUNTER — Ambulatory Visit (INDEPENDENT_AMBULATORY_CARE_PROVIDER_SITE_OTHER): Payer: BLUE CROSS/BLUE SHIELD | Admitting: Family Medicine

## 2015-08-01 ENCOUNTER — Encounter: Payer: Self-pay | Admitting: Family Medicine

## 2015-08-01 VITALS — BP 127/75 | HR 83 | Wt 274.0 lb

## 2015-08-01 DIAGNOSIS — M722 Plantar fascial fibromatosis: Secondary | ICD-10-CM | POA: Diagnosis not present

## 2015-08-01 NOTE — Patient Instructions (Signed)
Thank you for coming in today. Return for orthotics  Do the ice massage Do the standing heel raises stretch.

## 2015-08-01 NOTE — Progress Notes (Signed)
   Subjective:    I'm seeing this patient as a consultation for:  Dr. Eppie GibsonMetheny  CC: Foot pain  HPI: Patient has bilateral foot pain left worse than right present for the last 3 months. Pain occurred without injury. Pain is located in the plantar heels bilaterally and is worse with the first step in the morning and after a long day of walking. She suspects planter fasciitis and is already tried a home exercise program consisting of stretching and icing. Additionally she's used over-the-counter heel cushions and some over-the-counter insoles which have not helped much. She notes the pain has become debilitating at times. She denies any radiating pain weakness or numbness fevers or chills.  Past medical history, Surgical history, Family history not pertinant except as noted below, Social history, Allergies, and medications have been entered into the medical record, reviewed, and no changes needed.   Review of Systems: No headache, visual changes, nausea, vomiting, diarrhea, constipation, dizziness, abdominal pain, skin rash, fevers, chills, night sweats, weight loss, swollen lymph nodes, body aches, joint swelling, muscle aches, chest pain, shortness of breath, mood changes, visual or auditory hallucinations.   Objective:    Filed Vitals:   08/01/15 1026  BP: 127/75  Pulse: 83   General: Well Developed, well nourished, and in no acute distress.  Neuro/Psych: Alert and oriented x3, extra-ocular muscles intact, able to move all 4 extremities, sensation grossly intact. Skin: Warm and dry, no rashes noted.  Respiratory: Not using accessory muscles, speaking in full sentences, trachea midline.  Cardiovascular: Pulses palpable, no extremity edema. Abdomen: Does not appear distended. MSK: Feet are largely normal appearing bilaterally with no obvious deformity. The left foot is tender to palpation along the plantar calcaneus the right foot is nontender entirely. Pulses capillary refill sensation are  intact throughout. Normal gait.  No results found for this or any previous visit (from the past 24 hour(s)). No results found.  Impression and Recommendations:    Assessment and Plan: 40 y.o. female with Bilateral left worse than right plantar fasciitis.  Plan for eccentric calf exercises, custom orthotics, and dedicated ice massage. Return after a month or so. If not better would consider injection.

## 2015-08-04 ENCOUNTER — Encounter: Payer: Self-pay | Admitting: Family Medicine

## 2015-08-04 ENCOUNTER — Ambulatory Visit (INDEPENDENT_AMBULATORY_CARE_PROVIDER_SITE_OTHER): Payer: BLUE CROSS/BLUE SHIELD | Admitting: Family Medicine

## 2015-08-04 VITALS — BP 134/81 | HR 73 | Wt 274.0 lb

## 2015-08-04 DIAGNOSIS — M722 Plantar fascial fibromatosis: Secondary | ICD-10-CM | POA: Diagnosis not present

## 2015-08-04 NOTE — Progress Notes (Signed)
    Orthotics Note:   Patient was fitted for a : standard, cushioned, semi-rigid orthotic. The orthotic was heated and afterward the patient stood on the orthotic blank positioned on the orthotic stand. The patient was positioned in subtalar neutral position and 10 degrees of ankle dorsiflexion in a weight bearing stance. After completion of molding, a stable base was applied to the orthotic blank. The blank was ground to a stable position for weight bearing. Size: 9 Base: White EVA Additional Posting and Padding: None The patient ambulated these, and they were very comfortable.  I spent 40 minutes with this patient, greater than 50% was face-to-face time counseling regarding the below diagnosis.   

## 2015-08-04 NOTE — Patient Instructions (Signed)
Thank you for coming in today. Return in a month or so.

## 2015-08-24 ENCOUNTER — Encounter: Payer: Self-pay | Admitting: Family Medicine

## 2015-09-01 ENCOUNTER — Encounter: Payer: Self-pay | Admitting: Family Medicine

## 2015-09-01 ENCOUNTER — Ambulatory Visit (INDEPENDENT_AMBULATORY_CARE_PROVIDER_SITE_OTHER): Payer: BLUE CROSS/BLUE SHIELD | Admitting: Family Medicine

## 2015-09-01 VITALS — BP 108/71 | HR 73 | Wt 277.0 lb

## 2015-09-01 DIAGNOSIS — M722 Plantar fascial fibromatosis: Secondary | ICD-10-CM | POA: Diagnosis not present

## 2015-09-01 NOTE — Progress Notes (Signed)
       [name] is a 40 y.o. female who [name]: Primary Care Sports Medicine today for follow-up plantar fasciitis. Patient returns to month to discuss worsening plantar fasciitis. She notes pain is worsened and she has used a cam walker boot to get around. Pain is severe at times. Pain is worse with ambulation and better with rest. No radiating pain weakness or numbness. No repeat injury.   Past Medical History:  Diagnosis Date  . ADHD (attention deficit hyperactivity disorder)    husband  . Chromosomal abnormality   . HSV (herpes simplex virus) anogenital infection   . Kidney stone on left side   . Miscarriage    x 8   Past Surgical History:  Procedure Laterality Date  . arm surgery for fracture    . DILATION AND CURETTAGE OF UTERUS  1998  . KNEE SURGERY    . TONSILLECTOMY AND ADENOIDECTOMY    . WISDOM TOOTH EXTRACTION     Social History  Substance Use Topics  . Smoking status: Former Games developermoker  . Smokeless tobacco: Not on file  . Alcohol use No   family history includes Autism spectrum disorder in her daughter; Other in her daughter.  ROS as above:  Medications: Current Outpatient Prescriptions  Medication Sig Dispense Refill  . ibuprofen (ADVIL,MOTRIN) 200 MG tablet Take 200 mg by mouth every 6 (six) hours as needed.    . Prenatal Multivit-Min-Fe-FA (PRENATAL VITAMINS PO) Take 1 tablet by mouth daily.      No current facility-administered medications for this visit.    No Known Allergies   Exam:  BP 108/71   Pulse 73   Wt 277 lb (125.6 kg)   BMI 44.71 kg/m  Gen: Well NAD Left foot normal-appearing. Tender palpation medial plantar calcaneus. Pulses capillary refill and sensation intact distally.  Procedure: Real-time Ultrasound Guided Injection of left plantar fascia insertion onto calcaneus  Device: GE Logiq E  Images permanently stored and available  for review in the ultrasound unit. Verbal informed consent obtained. Discussed risks and benefits of procedure. Warned about infection bleeding damage to structures skin hypopigmentation and fat atrophy among others. Patient expresses understanding and agreement Time-out conducted.  Noted no overlying erythema, induration, or other signs of local infection.  Skin prepped in a sterile fashion.  Local anesthesia: Topical Ethyl chloride.  With sterile technique and under real time ultrasound guidance: 40 mg of Kenalog and 2 mL of Marcaine injected easily.  Completed without difficulty  Pain immediately resolved suggesting accurate placement of the medication.  Advised to call if fevers/chills, erythema, induration, drainage, or persistent bleeding.  Images permanently stored and available for review in the ultrasound unit.  Impression: Technically successful ultrasound guided injection.    No results found for this or any previous visit (from the past 24 hour(s)). No results found.    Assessment and Plan: 40 y.o. female with plantar fasciitis worsening. Plan to use cam walker boot sparingly and continue home exercise program. Injection today. Recheck in one month.   No orders of the defined types were placed in this encounter.   Discussed warning signs or symptoms. Please see discharge instructions. Patient expresses understanding.

## 2015-09-01 NOTE — Patient Instructions (Signed)
Thank you for coming in today. Call or go to the ER if you develop a large red swollen joint with extreme pain or oozing puss.  Return in 1 month.  Try to wean out of boot.  Continue exercises.

## 2015-10-06 ENCOUNTER — Encounter: Payer: Self-pay | Admitting: Family Medicine

## 2015-10-06 ENCOUNTER — Ambulatory Visit (INDEPENDENT_AMBULATORY_CARE_PROVIDER_SITE_OTHER): Payer: BLUE CROSS/BLUE SHIELD

## 2015-10-06 ENCOUNTER — Ambulatory Visit (INDEPENDENT_AMBULATORY_CARE_PROVIDER_SITE_OTHER): Payer: BLUE CROSS/BLUE SHIELD | Admitting: Family Medicine

## 2015-10-06 VITALS — BP 125/72 | HR 76 | Wt 275.0 lb

## 2015-10-06 DIAGNOSIS — M722 Plantar fascial fibromatosis: Secondary | ICD-10-CM

## 2015-10-06 DIAGNOSIS — R05 Cough: Secondary | ICD-10-CM

## 2015-10-06 DIAGNOSIS — J209 Acute bronchitis, unspecified: Secondary | ICD-10-CM | POA: Diagnosis not present

## 2015-10-06 MED ORDER — AZITHROMYCIN 250 MG PO TABS: 250.0000 mg | ORAL_TABLET | Freq: Every day | ORAL | 0 refills | Status: DC

## 2015-10-06 MED ORDER — GUAIFENESIN-CODEINE 100-10 MG/5ML PO SOLN: 5.0000 mL | Freq: Every evening | ORAL | 0 refills | Status: DC | PRN

## 2015-10-06 MED ORDER — ALBUTEROL SULFATE HFA 108 (90 BASE) MCG/ACT IN AERS: 2.0000 | INHALATION_SPRAY | Freq: Four times a day (QID) | RESPIRATORY_TRACT | 0 refills | Status: DC | PRN

## 2015-10-06 MED ORDER — IPRATROPIUM-ALBUTEROL 0.5-2.5 (3) MG/3ML IN SOLN
3.0000 mL | Freq: Once | RESPIRATORY_TRACT | Status: AC
  Administered 2015-10-06: 3 mL via RESPIRATORY_TRACT

## 2015-10-06 MED ORDER — PREDNISONE 10 MG PO TABS: 30.0000 mg | ORAL_TABLET | Freq: Every day | ORAL | 0 refills | Status: DC

## 2015-10-06 NOTE — Patient Instructions (Signed)
Thank you for coming in today. Continue foot exercises Take prednisone daily. Use albuterol and cough medicine as needed. Take azithromycin if not better.  Call or go to the emergency room if you get worse, have trouble breathing, have chest pains, or palpitations.    Acute Bronchitis Bronchitis is inflammation of the airways that extend from the windpipe into the lungs (bronchi). The inflammation often causes mucus to develop. This leads to a cough, which is the most common symptom of bronchitis.  In acute bronchitis, the condition usually develops suddenly and goes away over time, usually in a couple weeks. Smoking, allergies, and asthma can make bronchitis worse. Repeated episodes of bronchitis may cause further lung problems.  CAUSES Acute bronchitis is most often caused by the same virus that causes a cold. The virus can spread from person to person (contagious) through coughing, sneezing, and touching contaminated objects. SIGNS AND SYMPTOMS   Cough.   Fever.   Coughing up mucus.   Body aches.   Chest congestion.   Chills.   Shortness of breath.   Sore throat.  DIAGNOSIS  Acute bronchitis is usually diagnosed through a physical exam. Your health care provider will also ask you questions about your medical history. Tests, such as chest X-rays, are sometimes done to rule out other conditions.  TREATMENT  Acute bronchitis usually goes away in a couple weeks. Oftentimes, no medical treatment is necessary. Medicines are sometimes given for relief of fever or cough. Antibiotic medicines are usually not needed but may be prescribed in certain situations. In some cases, an inhaler may be recommended to help reduce shortness of breath and control the cough. A cool mist vaporizer may also be used to help thin bronchial secretions and make it easier to clear the chest.  HOME CARE INSTRUCTIONS  Get plenty of rest.   Drink enough fluids to keep your urine clear or pale yellow  (unless you have a medical condition that requires fluid restriction). Increasing fluids may help thin your respiratory secretions (sputum) and reduce chest congestion, and it will prevent dehydration.   Take medicines only as directed by your health care provider.  If you were prescribed an antibiotic medicine, finish it all even if you start to feel better.  Avoid smoking and secondhand smoke. Exposure to cigarette smoke or irritating chemicals will make bronchitis worse. If you are a smoker, consider using nicotine gum or skin patches to help control withdrawal symptoms. Quitting smoking will help your lungs heal faster.   Reduce the chances of another bout of acute bronchitis by washing your hands frequently, avoiding people with cold symptoms, and trying not to touch your hands to your mouth, nose, or eyes.   Keep all follow-up visits as directed by your health care provider.  SEEK MEDICAL CARE IF: Your symptoms do not improve after 1 week of treatment.  SEEK IMMEDIATE MEDICAL CARE IF:  You develop an increased fever or chills.   You have chest pain.   You have severe shortness of breath.  You have bloody sputum.   You develop dehydration.  You faint or repeatedly feel like you are going to pass out.  You develop repeated vomiting.  You develop a severe headache. MAKE SURE YOU:   Understand these instructions.  Will watch your condition.  Will get help right away if you are not doing well or get worse.   This information is not intended to replace advice given to you by your health care provider. Make sure you  discuss any questions you have with your health care provider.   Document Released: 02/15/2004 Document Revised: 01/28/2014 Document Reviewed: 06/30/2012 Elsevier Interactive Patient Education [employer].

## 2015-10-06 NOTE — Progress Notes (Signed)
Doris MaxinHeather M Hast is a 40 y.o. female who [facility] [name] [facility] today for follow-up foot pain and discuss new respiratory illness.  Patient has been seen several times for plantar fasciitis and received a plantar fascial injection about a month ago. She notes her feet are feeling much better now. She continues to use her home exercise program which has helped.  However she notes no onset of cough congestion runny nose and shortness of breath and wheezing. Symptoms present for about 2 weeks. Cough was initially productive but is no longer productive. She denies current fever. The cough is very bothersome especially at night.   Past Medical History:  Diagnosis Date  . ADHD (attention deficit hyperactivity disorder)    husband  . Chromosomal abnormality   . HSV (herpes simplex virus) anogenital infection   . Kidney stone on left side   . Miscarriage    x 8   Past Surgical History:  Procedure Laterality Date  . arm surgery for fracture    . DILATION AND CURETTAGE OF UTERUS  1998  . KNEE SURGERY    . TONSILLECTOMY AND ADENOIDECTOMY    . WISDOM TOOTH EXTRACTION     Social History  Substance Use Topics  . Smoking status: Former Games developermoker  . Smokeless tobacco: Not on file  . Alcohol use No   family history includes Autism spectrum disorder in her daughter; Other in her daughter.  ROS as above:  Medications: Current Outpatient Prescriptions  Medication Sig Dispense Refill  . albuterol (PROVENTIL HFA;VENTOLIN HFA) 108 (90 Base) MCG/ACT inhaler Inhale 2 puffs into the lungs every 6 (six) hours as needed for wheezing or shortness of breath. 1 Inhaler 0  . azithromycin (ZITHROMAX) 250 MG tablet Take 1 tablet (250 mg total) by mouth daily. Take first 2 tablets together, then 1 every day until finished. 6 tablet 0  . guaiFENesin-codeine 100-10 MG/5ML syrup Take 5 mLs by  mouth at bedtime as needed for cough. 120 mL 0  . ibuprofen (ADVIL,MOTRIN) 200 MG tablet Take 200 mg by mouth every 6 (six) hours as needed.    . predniSONE (DELTASONE) 10 MG tablet Take 3 tablets (30 mg total) by mouth daily with breakfast. 15 tablet 0  . Prenatal Multivit-Min-Fe-FA (PRENATAL VITAMINS PO) Take 1 tablet by mouth daily.      No current facility-administered medications for this visit.    No Known Allergies   Exam:  BP 125/72   Pulse 76   Wt 275 lb (124.7 kg)   LMP 09/22/2015   BMI 44.39 kg/m  Gen: Well NAD HEENT: EOMI,  MMM Clear nasal discharge Lungs: Normal work of breathing. Wheezing and prolonged expiratory phase and coarse breath sounds present bilaterally Heart: RRR no MRG Abd: NABS, Soft. Nondistended, Nontender Exts: Brisk capillary refill, warm and well perfused.   Patient was given a 2.5/0.5 mg DuoNeb nebulizer treatment and felt a bit better.  No results found for this or any previous visit (from the past 24 hour(s)). No results found.    Assessment and Plan: 40 y.o. female with  Resolving plantar fasciitis. Continue exercises  Bronchitis: Likely the etiology. Plan to treat with albuterol and prednisone and codeine cough syrup. Backup azithromycin as needed. Chest x-ray pending.   Orders Placed This Encounter  Procedures  . DG Chest 2 View    Order Specific Question:   Reason for exam:    Answer:   Cough, assess intra-thoracic  pathology    Order Specific Question:   Is the patient pregnant?    Answer:   No    Order Specific Question:   Preferred imaging location?    Answer:   [facility]    Discussed warning signs or symptoms. Please see discharge instructions. Patient expresses understanding.

## 2015-10-12 ENCOUNTER — Encounter: Payer: Self-pay | Admitting: Family Medicine

## 2015-10-27 ENCOUNTER — Other Ambulatory Visit: Payer: Self-pay | Admitting: Family Medicine

## 2015-10-27 ENCOUNTER — Ambulatory Visit (INDEPENDENT_AMBULATORY_CARE_PROVIDER_SITE_OTHER): Payer: BLUE CROSS/BLUE SHIELD | Admitting: Family Medicine

## 2015-10-27 VITALS — BP 128/74 | HR 81 | Temp 99.0°F | Wt 271.0 lb

## 2015-10-27 DIAGNOSIS — R059 Cough, unspecified: Secondary | ICD-10-CM

## 2015-10-27 DIAGNOSIS — R05 Cough: Secondary | ICD-10-CM

## 2015-10-27 DIAGNOSIS — J209 Acute bronchitis, unspecified: Secondary | ICD-10-CM | POA: Diagnosis not present

## 2015-10-27 MED ORDER — ALBUTEROL SULFATE (2.5 MG/3ML) 0.083% IN NEBU
2.5000 mg | INHALATION_SOLUTION | Freq: Once | RESPIRATORY_TRACT | Status: AC
  Administered 2015-10-27: 2.5 mg via RESPIRATORY_TRACT

## 2015-10-27 MED ORDER — PREDNISONE 20 MG PO TABS: 40.0000 mg | ORAL_TABLET | Freq: Every day | ORAL | 0 refills | Status: DC

## 2015-10-27 MED ORDER — LEVOFLOXACIN 500 MG PO TABS: 500.0000 mg | ORAL_TABLET | Freq: Every day | ORAL | 0 refills | Status: AC

## 2015-10-27 NOTE — Patient Instructions (Signed)
Increase albuterol to 2 puffs every 4 hours during the day.

## 2015-10-27 NOTE — Progress Notes (Signed)
   Subjective:    Patient ID: [name], female    DOB: 1975/02/27, 40 y.o.   MRN: [id number]  HPI [age] you female with Cough x 6 weeks. She was actually seen in was 3 weeks ago in our office and treated with azithromycin and prednisone. She did feel like the prednisone helped. She felt a little better. Her cough was no longer is productive but now she still coughing and wheezing. She has been using some albuterol which does seem to provide temporary relief. The cough is much worse at night. She says during the day she'll actually go long periods without coughing at all and then it seems to come in a fit or spasm. Does like the cough is mostly coming from her upper chest and the back of her throat. She doesn't feel like it's deep in her lungs.  Says the cough medicine is not helping.    Review of Systems     Objective:   Physical Exam  Constitutional: She is oriented to person, place, and time. She appears well-developed and well-nourished.  HENT:  Head: Normocephalic and atraumatic.  Right Ear: External ear normal.  Left Ear: External ear normal.  Nose: Nose normal.  Mouth/Throat: Oropharynx is clear and moist.  TMs and canals are clear.   Eyes: Conjunctivae and EOM are normal. Pupils are equal, round, and reactive to light.  Neck: Neck supple. No thyromegaly present.  Cardiovascular: Normal rate, regular rhythm and normal heart sounds.   Pulmonary/Chest: Effort normal and breath sounds normal. She has no wheezes.  Lymphadenopathy:    She has no cervical adenopathy.  Neurological: She is alert and oriented to person, place, and time.  Skin: Skin is warm and dry.  Psychiatric: She has a normal mood and affect.   Blood pressure (!) 141/69, pulse 96, temperature 99 F (37.2 C), temperature source Oral, weight 271 lb (122.9 kg), last menstrual period 09/22/2015, SpO2 98 %.       Assessment & Plan:  Cough persistent-I do hear wheezing on exam but no crackles or rhonchi. With a  lot of wheezing on exam I think she would benefit from a second round of prednisone. Because of the course of her cough I do think we should test her for pertussis. She has Artie had treatment herself but I think it'll be helpful to know she does have 8 children at home.  Continue with albuterol as needed. [place] did do an albuterol treatment here to see if that improved her wheezing and her peak flow. She improved post neb.  Increased albuterol to Q4.

## 2015-10-31 LAB — BORDETELLA PERTUSSIS PCR
B parapertussis, DNA: NOT DETECTED
B pertussis, DNA: NOT DETECTED

## 2015-10-31 LAB — CULTURE, BORDETELLA PERTUSSIS

## 2015-11-01 MED ORDER — DOXYCYCLINE HYCLATE 100 MG PO TABS: 100.0000 mg | ORAL_TABLET | Freq: Two times a day (BID) | ORAL | 0 refills | Status: DC

## 2015-11-01 NOTE — Addendum Note (Signed)
Addended by: Nani GasserMETHENEY, CATHERINE D on: 11/01/2015 03:58 PM   Modules accepted: Orders

## 2015-11-15 ENCOUNTER — Encounter: Payer: Self-pay | Admitting: Family Medicine

## 2015-12-04 ENCOUNTER — Telehealth: Payer: Self-pay | Admitting: Family Medicine

## 2015-12-04 NOTE — Telephone Encounter (Signed)
Please see note below. Patient did not read the last my chart note. Please contact her with the information below.  This message is to inform you that the patient has not yet read the following message. (Notification date: December 04, 2015)  RE: Visit Follow-Up Question   From Agapito Gamesatherine D Metheney, MD To Sent 11/20/2015 8:19 AM  Oh, Daleen Snookwow, I'm so sorry! The air quality could definitely be affecting your breathing. I hope you don't have any type of extensive mold issue!  We can also consider putting you on an inhaled corticosteroid like Flovent or Asmanex. Guidelines recommend that if you're using her albuterol more than 3 times per week to consider putting you on an inhaled corticosteroid for about 2 months to reduce the inflammation in the lungs. Usually after about a week and also reduces sure need for the albuterol. If you would like I can send one of these over to your pharmacy.   Dr. MJudie Petit

## 2015-12-04 NOTE — Telephone Encounter (Signed)
Called and informed pt of recommendations. She stated that she wasn't sure which medication is preferred on her medication list but is open to either one that is sent. She stated that they getting ready to start doing some mold remediation in her home in the next few days which will be hellpful. She would like whatever is generic or whatever has been on the market longer .Doris PacasBarkley, [name]

## 2015-12-05 MED ORDER — BECLOMETHASONE DIPROPIONATE 40 MCG/ACT IN AERS: 2.0000 | INHALATION_SPRAY | Freq: Two times a day (BID) | RESPIRATORY_TRACT | 2 refills | Status: DC

## 2015-12-05 NOTE — Telephone Encounter (Signed)
rx sent

## 2015-12-08 ENCOUNTER — Encounter: Payer: Self-pay | Admitting: Physician Assistant

## 2015-12-08 ENCOUNTER — Ambulatory Visit (INDEPENDENT_AMBULATORY_CARE_PROVIDER_SITE_OTHER): Payer: BLUE CROSS/BLUE SHIELD | Admitting: Physician Assistant

## 2015-12-08 VITALS — BP 113/62 | HR 88 | Ht 66.0 in | Wt 270.0 lb

## 2015-12-08 DIAGNOSIS — M722 Plantar fascial fibromatosis: Secondary | ICD-10-CM | POA: Diagnosis not present

## 2015-12-08 DIAGNOSIS — Z800 Family history of malignant neoplasm of digestive organs: Secondary | ICD-10-CM

## 2015-12-08 DIAGNOSIS — R059 Cough, unspecified: Secondary | ICD-10-CM

## 2015-12-08 DIAGNOSIS — Z1211 Encounter for screening for malignant neoplasm of colon: Secondary | ICD-10-CM

## 2015-12-08 DIAGNOSIS — R05 Cough: Secondary | ICD-10-CM

## 2015-12-08 DIAGNOSIS — R002 Palpitations: Secondary | ICD-10-CM

## 2015-12-08 DIAGNOSIS — R062 Wheezing: Secondary | ICD-10-CM

## 2015-12-08 NOTE — Patient Instructions (Signed)
Will schedule holter monitor, podiatry, colonoscopy.

## 2015-12-08 NOTE — Progress Notes (Signed)
   Subjective:    Patient ID: [name], female    DOB: 11/21/1975, 40 y.o.   MRN: [id number]  HPI  Pt is a 40 yo female who presents to the clinic with concern for palpitations. 2 weeks ago she had her first episode in the car. Her hands, arms, lips went numb. Her heart felt like was a skipping a beat. She couldn't get her breath. She pulled into fire station and EMS was called. EKG, glucose, pulse ox was normal. Since this her heart feels like skipping beats. She is adopted but concerned because she was told she had a heart condition for the first 4 months of life and in the hospital. No CP. She has been wheezing and coughing for weeks. She saw Dr. Linford ArnoldMetheney recently and was given albuterol and steroid. Continues to wheeze. She was sent qvar but not started.   Pt request colonoscopy. Mother died of colon cancer at age 38 she was diagnosed at age 40.   Plantar fasciitis has not improved with sports medicine follow ups here. She would like referral.    Review of Systems  All other systems reviewed and are negative.      Objective:   Physical Exam  Constitutional: She is oriented to person, place, and time. She appears well-developed and well-nourished.  HENT:  Head: Normocephalic and atraumatic.  Cardiovascular: Normal rate, regular rhythm and normal heart sounds.   Pulmonary/Chest:  Bilateral lung wheezing expiratory. No rales or crackles.   Neurological: She is alert and oriented to person, place, and time.  Psychiatric: She has a normal mood and affect. Her behavior is normal.          Assessment & Plan:  Marland Kitchen.Marland Kitchen.Diagnoses and all orders for this visit:  Palpitations -     EKG 12-Lead -     TSH -     CBC -     Ferritin -     COMPLETE METABOLIC PANEL WITH GFR -     Antinuclear Antib (ANA) -     CBC with Differential/Platelet -     Holter monitor - 24 hour; Future  Wheezing -     CBC with Differential/Platelet  Cough  Plantar fasciitis, bilateral -     Ambulatory  referral to Podiatry  Colon cancer screening -     Ambulatory referral to Gastroenterology   Wheezing- I do think there is an asthma component. She has not started qvar. Pt encouraged to start. Follow up with PCP after being on for 1 month.   Palpations- some of her episode sounds like could be some panic involved. Pt declines any increased anxiety. will get labs done and ordered holter monitor. Discussed BB for symptomatic control.

## 2015-12-09 LAB — FERRITIN: FERRITIN: 24 ng/mL (ref 10–154)

## 2015-12-09 LAB — COMPLETE METABOLIC PANEL WITH GFR
ALT: 12 U/L (ref 6–29)
AST: 11 U/L (ref 10–30)
Albumin: 4.2 g/dL (ref 3.6–5.1)
Alkaline Phosphatase: 64 U/L (ref 33–115)
BILIRUBIN TOTAL: 0.3 mg/dL (ref 0.2–1.2)
BUN: 14 mg/dL (ref 7–25)
CALCIUM: 9.5 mg/dL (ref 8.6–10.2)
CO2: 26 mmol/L (ref 20–31)
CREATININE: 0.87 mg/dL (ref 0.50–1.10)
Chloride: 104 mmol/L (ref 98–110)
GFR, Est Non African American: 85 mL/min (ref >=60)
Glucose, Bld: 83 mg/dL (ref 65–99)
Potassium: 4.5 mmol/L (ref 3.5–5.3)
Sodium: 141 mmol/L (ref 135–146)
TOTAL PROTEIN: 7.3 g/dL (ref 6.1–8.1)

## 2015-12-09 LAB — CBC
HCT: 39.2 % (ref 35.0–45.0)
HEMOGLOBIN: 12.7 g/dL (ref 11.7–15.5)
MCH: 26.2 pg — AB (ref 27.0–33.0)
MCHC: 32.4 g/dL (ref 32.0–36.0)
MCV: 80.8 fL (ref 80.0–100.0)
MPV: 8.8 fL (ref 7.5–12.5)
Platelets: 513 10*3/uL — ABNORMAL HIGH (ref 140–400)
RBC: 4.85 MIL/uL (ref 3.80–5.10)
RDW: 14.4 % (ref 11.0–15.0)
WBC: 11.9 10*3/uL — ABNORMAL HIGH (ref 3.8–10.8)

## 2015-12-09 LAB — TSH: TSH: 1.12 mIU/L

## 2015-12-11 LAB — ANA: Anti Nuclear Antibody(ANA): NEGATIVE

## 2015-12-12 DIAGNOSIS — Z800 Family history of malignant neoplasm of digestive organs: Secondary | ICD-10-CM | POA: Insufficient documentation

## 2015-12-13 ENCOUNTER — Encounter: Payer: Self-pay | Admitting: Family Medicine

## 2015-12-13 MED ORDER — ALBUTEROL SULFATE (2.5 MG/3ML) 0.083% IN NEBU: 2.5000 mg | INHALATION_SOLUTION | Freq: Four times a day (QID) | RESPIRATORY_TRACT | 1 refills | Status: DC | PRN

## 2015-12-13 NOTE — Telephone Encounter (Signed)
Yes, signed off on med. Thank yuou

## 2015-12-19 ENCOUNTER — Encounter: Payer: Self-pay | Admitting: Internal Medicine

## 2015-12-20 ENCOUNTER — Ambulatory Visit (INDEPENDENT_AMBULATORY_CARE_PROVIDER_SITE_OTHER): Payer: BLUE CROSS/BLUE SHIELD | Admitting: Podiatry

## 2015-12-20 ENCOUNTER — Encounter: Payer: Self-pay | Admitting: Podiatry

## 2015-12-20 VITALS — BP 131/82 | HR 88 | Ht 66.0 in | Wt 270.0 lb

## 2015-12-20 DIAGNOSIS — M21969 Unspecified acquired deformity of unspecified lower leg: Secondary | ICD-10-CM | POA: Diagnosis not present

## 2015-12-20 DIAGNOSIS — M216X2 Other acquired deformities of left foot: Secondary | ICD-10-CM | POA: Diagnosis not present

## 2015-12-20 DIAGNOSIS — M216X1 Other acquired deformities of right foot: Secondary | ICD-10-CM | POA: Diagnosis not present

## 2015-12-20 DIAGNOSIS — M722 Plantar fascial fibromatosis: Secondary | ICD-10-CM

## 2015-12-20 MED ORDER — TRAMADOL HCL 50 MG PO TABS: 50.0000 mg | ORAL_TABLET | Freq: Three times a day (TID) | ORAL | 0 refills | Status: DC | PRN

## 2015-12-20 NOTE — Patient Instructions (Addendum)
Seen for pain in both heels. Noted of short first metatarsal bone bilateral. Reviewed findings. Placed trial posting on existing orthotics under the first MPJ. May need well supported custom orthotics. Return in one week.

## 2015-12-20 NOTE — Progress Notes (Signed)
SUBJECTIVE: 40 y.o. year old female presents complaining of pain in both heels L>R. Pain in left whole heel since April, 2017 L>R.  Pain level at 7 on left, 5 on right in 1-10 scale with 10 being the most. Been treated at Sport medicine with custom orthotics for 6 months, Night Splints, different type of well supported shoes, cortisone injection, stretch exercise etc. Last injection helped for 2 months. Now pain is too much and wakes her up at night. Has 7 children at home to take care of, age range 113 and 1.  Patient is referred by Dr. Linford ArnoldMetheney.   REVIEW OF SYSTEMS: Pertinent items noted in HPI and remainder of comprehensive ROS otherwise negative.  OBJECTIVE: DERMATOLOGIC EXAMINATION: No abnormal skin lesions noted.  VASCULAR EXAMINATION OF LOWER LIMBS: All pedal pulses are palpable with normal pulsation.  Capillary Filling times within 3 seconds in all digits.  No edema or erythema noted. Temperature gradient from tibial crest to dorsum of foot is within normal bilateral.  NEUROLOGIC EXAMINATION OF THE LOWER LIMBS: All epicritic and tactile sensations grossly intact.  MUSCULOSKELETAL EXAMINATION: Positive for short great toe bilateral. Normal ankle joint flexion and extension.  Normal forefoot and rearfoot joint motion and muscle strength.  RADIOGRAPHIC STUDIES:  AP View:  Short first metatarsal bone bilateral (-6), increased lateral deviation of CCJ bilateral.  Lateral view:  Anterior advancement of the Talar head bilateral.  Positive of posterior calcaneal spur bilateral. No acute changes in osseous or articular surfaces noted.  ASSESSMENT: Brachy metatarsal first bilateral. Plantar fasciitis bilateral R>L. Compensated STJ Pronation bilateral.  PLAN: Reviewed clinical findings and available treatment options. 1/4" felt pad placed to support the first metatarsal head of the existing orthotics. May need hard shell custom orthotic with the short first metatarsal  accommodation. Return in one week to see if we need to change the existing orthotics.

## 2015-12-27 ENCOUNTER — Ambulatory Visit (INDEPENDENT_AMBULATORY_CARE_PROVIDER_SITE_OTHER): Payer: BLUE CROSS/BLUE SHIELD | Admitting: Podiatry

## 2015-12-27 DIAGNOSIS — M722 Plantar fascial fibromatosis: Secondary | ICD-10-CM | POA: Diagnosis not present

## 2015-12-27 DIAGNOSIS — M21969 Unspecified acquired deformity of unspecified lower leg: Secondary | ICD-10-CM | POA: Diagnosis not present

## 2015-12-27 DIAGNOSIS — M216X2 Other acquired deformities of left foot: Secondary | ICD-10-CM | POA: Diagnosis not present

## 2015-12-27 DIAGNOSIS — M216X1 Other acquired deformities of right foot: Secondary | ICD-10-CM

## 2015-12-27 NOTE — Progress Notes (Signed)
SUBJECTIVE: 40 y.o. year old female presents for 1 week follow up on bilateral foot pain.  Patient believes that pad placed under the big toe has helped on right foot significantly. Left foot is better but still having some heel pain at the end of the day.   During her initial visit she reported that pain in left whole heel since April, 2017 L>R.  Pain level at 7 on left, 5 on right in 1-10 scale with 10 being the most. Been treated at Sport medicine with custom orthotics for 6 months, Night Splints, different type of well supported shoes, cortisone injection, stretch exercise etc. Last injection helped for 2 months. Now pain is too much and wakes her up at night. Has 7 children at home to take care of, age range 6813 and 1.  Patient is referred by Dr. Linford ArnoldMetheney.   OBJECTIVE: DERMATOLOGIC EXAMINATION: No abnormal skin lesions noted.  VASCULAR EXAMINATION OF LOWER LIMBS: All pedal pulses are palpable with normal pulsation.  No abnormal findings.   NEUROLOGIC EXAMINATION OF THE LOWER LIMBS: All epicritic and tactile sensations grossly intact.  MUSCULOSKELETAL EXAMINATION: Positive for short great toe bilateral. Normal ankle joint flexion and extension.  Normal forefoot and rearfoot joint motion and muscle strength.  Last x-ray exam indicated,  Significant short first metatarsal bone bilateral (-6), increased lateral deviation of CCJ bilateral in AP view.  ASSESSMENT: Brachy metatarsal first bilateral. Plantar fasciitis bilateral R>L. Compensated STJ Pronation bilateral. Improved foot pain with first ray posting over existing orthotics.  PLAN: Reviewed clinical findings and available treatment options. Dispensed extra 1/4" and 3/8" Felt pad to add to the first metatarsal head area of the existing orthotics. May need hard shell custom orthotic with the short first metatarsal accommodation. Left heel Injected with mixture of 4 mg Dexamethasone, 4 mg Triamcinolone, and 1 cc of  0.5% Marcaine plain.  Patient tolerated well without difficulty.  The next step is to replace current orthotics with functional orthotics with built in posting at the first MPJ area if possible.

## 2015-12-27 NOTE — Patient Instructions (Signed)
Improved foot pain with added pad to orthotics. Residual pain on left heel injected with cortisone. Extra padding dispensed. May benefit from custom orthotics.

## 2015-12-28 ENCOUNTER — Encounter: Payer: Self-pay | Admitting: Podiatry

## 2016-01-22 HISTORY — PX: COLONOSCOPY: SHX174

## 2016-01-24 ENCOUNTER — Ambulatory Visit: Payer: BLUE CROSS/BLUE SHIELD | Admitting: Podiatry

## 2016-01-26 DIAGNOSIS — Z6841 Body Mass Index (BMI) 40.0 and over, adult: Secondary | ICD-10-CM | POA: Diagnosis not present

## 2016-01-26 DIAGNOSIS — M25561 Pain in right knee: Secondary | ICD-10-CM | POA: Diagnosis not present

## 2016-02-02 ENCOUNTER — Encounter: Payer: Self-pay | Admitting: Internal Medicine

## 2016-02-02 ENCOUNTER — Ambulatory Visit (INDEPENDENT_AMBULATORY_CARE_PROVIDER_SITE_OTHER): Payer: BLUE CROSS/BLUE SHIELD | Admitting: Internal Medicine

## 2016-02-02 VITALS — BP 120/70 | HR 84 | Ht 66.0 in | Wt 273.0 lb

## 2016-02-02 DIAGNOSIS — Z800 Family history of malignant neoplasm of digestive organs: Secondary | ICD-10-CM | POA: Diagnosis not present

## 2016-02-02 DIAGNOSIS — K5900 Constipation, unspecified: Secondary | ICD-10-CM

## 2016-02-02 DIAGNOSIS — Z1211 Encounter for screening for malignant neoplasm of colon: Secondary | ICD-10-CM

## 2016-02-02 NOTE — Patient Instructions (Signed)

## 2016-02-02 NOTE — Progress Notes (Signed)
HISTORY OF PRESENT ILLNESS:  Doris MaxinHeather M Fodge is a 41 y.o. female with morbid obesity who presents upon referral from her primary care provider Dr. Glade LloydMatheny with chief complaints of altered bowel habits and a family history of colon cancer. The patient reports that her mother was diagnosed with colon cancer at age 39. Also reports grandmother and great-grandmother with colon cancer at advanced age. The patient has not been evaluated. She also reports change in bowel habits. Difficulty evacuating. No bleeding. No pain. Some bloating. GI review of systems is otherwise remarkable for occasional reflux without dysphagia. She has had vaginal delivery 8.  REVIEW OF SYSTEMS:  All non-GI ROS negative except for urinary leakage  Past Medical History:  Diagnosis Date  . ADHD (attention deficit hyperactivity disorder)    husband  . Chromosomal abnormality   . Congenital abnormality   . HSV (herpes simplex virus) anogenital infection   . Kidney stone on left side   . Miscarriage    x 8  . Plantar fasciitis     Past Surgical History:  Procedure Laterality Date  . arm surgery for fracture    . DILATION AND CURETTAGE OF UTERUS  1998  . KNEE SURGERY Left    torn menicus  . TONSILLECTOMY AND ADENOIDECTOMY    . WISDOM TOOTH EXTRACTION      Social History [name]  reports that she quit smoking about 12 years ago. Her smoking use included Cigarettes. She has never used smokeless tobacco. She reports that she does not drink alcohol or use drugs.  family history includes Autism spectrum disorder in her daughter; Breast cancer in her maternal aunt; Colon cancer in her maternal grandmother and other; Colon cancer (age of onset: 6445) in her mother; Colon polyps in her mother; Diabetes in her paternal grandmother; Heart attack in her maternal grandfather; Hypertension in her maternal grandfather; Lung cancer in her father; Other in her daughter and maternal aunt.  No Known Allergies     PHYSICAL  EXAMINATION: Vital signs: BP 120/70 Comment: large cuff  Pulse 84   Ht 5\' 6"  (1.676 m)   Wt 273 lb (123.8 kg)   BMI 44.06 kg/m   Constitutional: Pleasant, obese, deferred until colonoscopy generally well-appearing, no acute distress Psychiatric: alert and oriented x3, cooperative Eyes: extraocular movements intact, anicteric, conjunctiva pink Mouth: oral pharynx moist, no lesions Neck: supple no lymphadenopathy Cardiovascular: heart regular rate and rhythm, no murmur Lungs: clear to auscultation bilaterally Abdomen: soft, obese, nontender, nondistended, no obvious ascites, no peritoneal signs, normal bowel sounds, no organomegaly Rectal: Extremities: no lower extremity edema bilaterally Skin: no lesions on visible extremities Neuro: No focal deficits. Cranial nerves intact  ASSESSMENT:  1. Constipation. Likely functional 2. Family history colon cancer in mother at age 39   PLAN:  1. Colonoscopy for colon cancer screening. Appropriate candidate without contraindication. High-risk patient.The nature of the procedure, as well as the risks, benefits, and alternatives were carefully and thoroughly reviewed with the patient. Ample time for discussion and questions allowed. The patient understood, was satisfied, and agreed to proceed. 2. Discuss options for constipation post procedure  A copy of this consultation note has been sent to Dr. Glade LloydMatheny

## 2016-02-05 ENCOUNTER — Encounter: Payer: Self-pay | Admitting: Internal Medicine

## 2016-02-05 ENCOUNTER — Ambulatory Visit (AMBULATORY_SURGERY_CENTER): Payer: BLUE CROSS/BLUE SHIELD | Admitting: Internal Medicine

## 2016-02-05 VITALS — BP 107/60 | HR 77 | Temp 98.4°F | Resp 12 | Ht 66.0 in | Wt 273.0 lb

## 2016-02-05 DIAGNOSIS — Z1212 Encounter for screening for malignant neoplasm of rectum: Secondary | ICD-10-CM | POA: Diagnosis not present

## 2016-02-05 DIAGNOSIS — Z800 Family history of malignant neoplasm of digestive organs: Secondary | ICD-10-CM

## 2016-02-05 DIAGNOSIS — Z1211 Encounter for screening for malignant neoplasm of colon: Secondary | ICD-10-CM

## 2016-02-05 MED ORDER — SODIUM CHLORIDE 0.9 % IV SOLN: 500.0000 mL | INTRAVENOUS | Status: DC

## 2016-02-05 NOTE — Op Note (Signed)
[facility] Patient Name: [name] Procedure Date: 02/05/2016 9:59 AM MRN: [id number] Endoscopist: Wilhemina Bonito. Marina Goodell , MD Age: 41 Referring MD:  Date of Birth: December 30, 1975 Gender: Female Account #: [id number] Procedure:                Colonoscopy Indications:              Screening in patient at increased risk: Colorectal                            cancer in mother before age 22 (32); grandmother                            and great-grandmother as well Medicines:                Monitored Anesthesia Care Procedure:                Pre-Anesthesia Assessment:                           - Prior to the procedure, a History and Physical                            was performed, and patient medications and                            allergies were reviewed. The patient's tolerance of                            previous anesthesia was also reviewed. The risks                            and benefits of the procedure and the sedation                            options and risks were discussed with the patient.                            All questions were answered, and informed consent                            was obtained. Prior Anticoagulants: The patient has                            taken no previous anticoagulant or antiplatelet                            agents. ASA Grade Assessment: II - A patient with                            mild systemic disease. After reviewing the risks                            and benefits, the patient was deemed in  satisfactory condition to undergo the procedure.                           After obtaining informed consent, the colonoscope                            was passed under direct vision. Throughout the                            procedure, the patient's blood pressure, pulse, and                            oxygen saturations were monitored continuously. The                            Model CF-HQ190L [id number](SN#[id number])  scope was introduced                            through the anus and advanced to the the cecum,                            identified by appendiceal orifice and ileocecal                            valve. The ileocecal valve, appendiceal orifice,                            and rectum were photographed. The quality of the                            bowel preparation was excellent. The colonoscopy                            was performed without difficulty. The patient                            tolerated the procedure well. The bowel preparation                            used was SUPREP. Scope In: 10:11:00 AM Scope Out: 10:26:29 AM Scope Withdrawal Time: 0 hours 12 minutes 13 seconds  Total Procedure Duration: 0 hours 15 minutes 29 seconds  Findings:                 Multiple diverticula were found in the sigmoid                            colon and cecum.                           Internal hemorrhoids were found during                            retroflexion. The hemorrhoids were small.  The exam was otherwise without abnormality on                            direct and retroflexion views. Complications:            No immediate complications. Estimated blood loss:                            None. Estimated Blood Loss:     Estimated blood loss: none. Impression:               - Diverticulosis in the sigmoid colon and in the                            cecum.                           - Internal hemorrhoids.                           - The examination was otherwise normal on direct                            and retroflexion views.                           - No specimens collected. Recommendation:           - Repeat colonoscopy in 5 years for screening                            purposes.                           - Patient has a contact number available for                            emergencies. The signs and symptoms of potential                             delayed complications were discussed with the                            patient. Return to normal activities tomorrow.                            Written discharge instructions were provided to the                            patient.                           - Resume previous diet.                           - Continue present medications. Wilhemina Bonito. Marina Goodell, MD 02/05/2016 10:37:15 AM This report has been signed electronically.

## 2016-02-05 NOTE — Progress Notes (Signed)
To recovery, report to RN, VSS. 

## 2016-02-05 NOTE — Patient Instructions (Signed)
Impression/Recommendations:  Diverticulosis handout given to patient. Hemorrhoid handout given to patient.  Repeat colonoscopy in 5 years for screening purposes.  YOU HAD AN ENDOSCOPIC PROCEDURE TODAY AT THE [facility]:   Refer to the procedure report that was given to you for any specific questions about what was found during the examination.  If the procedure report does not answer your questions, please call your gastroenterologist to clarify.  If you requested that your care partner not be given the details of your procedure findings, then the procedure report has been included in a sealed envelope for you to review at your convenience later.  YOU SHOULD EXPECT: Some feelings of bloating in the abdomen. Passage of more gas than usual.  Walking can help get rid of the air that was put into your GI tract during the procedure and reduce the bloating. If you had a lower endoscopy (such as a colonoscopy or flexible sigmoidoscopy) you may notice spotting of blood in your stool or on the toilet paper. If you underwent a bowel prep for your procedure, you may not have a normal bowel movement for a few days.  Please Note:  You might notice some irritation and congestion in your nose or some drainage.  This is from the oxygen used during your procedure.  There is no need for concern and it should clear up in a day or so.  SYMPTOMS TO REPORT IMMEDIATELY:   Following lower endoscopy (colonoscopy or flexible sigmoidoscopy):  Excessive amounts of blood in the stool  Significant tenderness or worsening of abdominal pains  Swelling of the abdomen that is new, acute  Fever of 100F or higher  For urgent or emergent issues, a gastroenterologist can be reached at any hour by calling [phone].   DIET:  We do recommend a small meal at first, but then you may proceed to your regular diet.  Drink plenty of fluids but you should avoid alcoholic beverages for 24 hours.  ACTIVITY:  You  should plan to take it easy for the rest of today and you should NOT DRIVE or use heavy machinery until tomorrow (because of the sedation medicines used during the test).    FOLLOW UP: Our staff will call the number listed on your records the next business day following your procedure to check on you and address any questions or concerns that you may have regarding the information given to you following your procedure. If we do not reach you, we will leave a message.  However, if you are feeling well and you are not experiencing any problems, there is no need to return our call.  We will assume that you have returned to your regular daily activities without incident.  If any biopsies were taken you will be contacted by phone or by letter within the next 1-3 weeks.  Please call us at [phone] if you have not heard about the biopsies in 3 weeks.    SIGNATURES/CONFIDENTIALITY: You and/or your care partner have signed paperwork which will be entered into your electronic medical record.  These signatures attest to the fact that that the information above on your After Visit Summary has been reviewed and is understood.  Full responsibility of the confidentiality of this discharge information lies with you and/or your care-partner.  

## 2016-02-06 ENCOUNTER — Telehealth: Payer: Self-pay

## 2016-02-06 NOTE — Telephone Encounter (Signed)
  Follow up Call-  Call back number 02/05/2016  Post procedure Call Back phone  # [phone]  Permission to leave phone message Yes  Some recent data might be hidden     Patient questions:  Do you have a fever, pain , or abdominal swelling? No. Pain Score  0 *  Have you tolerated food without any problems? Yes.    Have you been able to return to your normal activities? Yes.    Do you have any questions about your discharge instructions: Diet   No. Medications  No. Follow up visit  No.  Do you have questions or concerns about your Care? No.  Actions: * If pain score is 4 or above: No action needed, pain <4.

## 2016-03-28 ENCOUNTER — Encounter: Payer: Self-pay | Admitting: Family Medicine

## 2016-03-28 MED ORDER — VALACYCLOVIR HCL 1 G PO TABS: 1000.0000 mg | ORAL_TABLET | Freq: Every day | ORAL | 99 refills | Status: DC

## 2016-04-11 ENCOUNTER — Telehealth: Payer: Self-pay | Admitting: Family Medicine

## 2016-04-11 NOTE — Telephone Encounter (Signed)
Call pt: it looks like she never saw her my charte response:  This message is to inform you that the patient has not yet read the following message. (Notification date: April 11, 2016)  RE: Non-Urgent Medical Question   From Agapito Gamesatherine D Metheney, MD To [facility] 03/28/2016 5:24 PM  Hi Prudie, no problem. I sent it over today. Take care.   Dr. Judie PetitM   Previous Messages    ----- Message -----   From: Geroge BasemanHeather M. Rennis HardingEllis   Sent: 03/28/2016 8:45 AM EST    To: Nani GasserMETHENEY,CATHERINE, MD  Subject: Non-Urgent Medical Question   Hey! I wasn't able to request a refill, but I need a refill for valtrex/generic for genital herpes. I haven't had an outbreak in years but know the tingling means one is coming, so I don't have a current script to request. I can come in, just didn't want to risk the flu for a medicine I have been on for over 20 years :) The condition is noted on my chart.   Thanks!   Audit ArchitectTrail   MyChart User Last Read On  Duwaine MaxinHeather M Poplar Not Read

## 2016-04-11 NOTE — Telephone Encounter (Signed)
Pt has picked up medication.  

## 2016-07-10 DIAGNOSIS — M79671 Pain in right foot: Secondary | ICD-10-CM | POA: Diagnosis not present

## 2016-07-10 DIAGNOSIS — G8929 Other chronic pain: Secondary | ICD-10-CM | POA: Diagnosis not present

## 2016-07-10 DIAGNOSIS — M722 Plantar fascial fibromatosis: Secondary | ICD-10-CM | POA: Diagnosis not present

## 2016-10-03 ENCOUNTER — Ambulatory Visit (INDEPENDENT_AMBULATORY_CARE_PROVIDER_SITE_OTHER): Payer: BLUE CROSS/BLUE SHIELD | Admitting: Family Medicine

## 2016-10-03 ENCOUNTER — Encounter: Payer: Self-pay | Admitting: Family Medicine

## 2016-10-03 VITALS — BP 120/63 | HR 75 | Ht 66.0 in | Wt 289.0 lb

## 2016-10-03 DIAGNOSIS — Z800 Family history of malignant neoplasm of digestive organs: Secondary | ICD-10-CM

## 2016-10-03 DIAGNOSIS — Z23 Encounter for immunization: Secondary | ICD-10-CM

## 2016-10-03 DIAGNOSIS — S8992XA Unspecified injury of left lower leg, initial encounter: Secondary | ICD-10-CM | POA: Diagnosis not present

## 2016-10-03 DIAGNOSIS — Z0000 Encounter for general adult medical examination without abnormal findings: Secondary | ICD-10-CM

## 2016-10-03 DIAGNOSIS — M25562 Pain in left knee: Secondary | ICD-10-CM

## 2016-10-03 LAB — TSH: TSH: 1.13 mIU/L

## 2016-10-03 LAB — COMPLETE METABOLIC PANEL WITH GFR
AG RATIO: 1.5 (calc) (ref 1.0–2.5)
ALBUMIN MSPROF: 4.1 g/dL (ref 3.6–5.1)
ALT: 14 U/L (ref 6–29)
AST: 11 U/L (ref 10–30)
Alkaline phosphatase (APISO): 50 U/L (ref 33–115)
BILIRUBIN TOTAL: 0.2 mg/dL (ref 0.2–1.2)
BUN: 14 mg/dL (ref 7–25)
CHLORIDE: 107 mmol/L (ref 98–110)
CO2: 25 mmol/L (ref 20–32)
Calcium: 8.9 mg/dL (ref 8.6–10.2)
Creat: 0.71 mg/dL (ref 0.50–1.10)
GFR, EST AFRICAN AMERICAN: 124 mL/min/{1.73_m2} (ref >=60)
GFR, Est Non African American: 107 mL/min/{1.73_m2} (ref >=60)
Globulin: 2.7 g/dL (calc) (ref 1.9–3.7)
Glucose, Bld: 91 mg/dL (ref 65–139)
POTASSIUM: 4.3 mmol/L (ref 3.5–5.3)
Sodium: 139 mmol/L (ref 135–146)
TOTAL PROTEIN: 6.8 g/dL (ref 6.1–8.1)

## 2016-10-03 NOTE — Progress Notes (Signed)
Subjective:     [name] is a 41 y.o. female and is here for a comprehensive physical exam. The patient reports problems - weight gain, and left knee pain.  Patient reports that she had a left knee injury approximately 8 or 9 weeks ago. She got a part-time job at [employer] as not all of her kids are in school. She was trying to step up onto a truck, it was about 2-1/2 feet off the groundand suddenly felt 3 loud pops. Even the people around her could hear the pop. She was unable to bear weight immediately after the injury. She went to the emergency department and they did do an x-ray just to rule out fracture. It was negative. She was then seen by [facility]. She was seen multiple timesbut her Worker's Comp. Never actually approvedany of the recommended treatments. Thus she has been doing home physical therapy. She reviewed with me the specific stretches she's been working on. And she's been taking it anti-inflammatories regularly. She was concerned because the last 2 times that she was seen at [facility]. Her blood pressure was mildly elevated at 130/80. She wasn't sure if it was because of the pain with anti-inflammatories. She is concerned because she still having some pain in the left knee as well as the inability to fully flex or fully extend the joint.It stays persistently swollen and she's had instability. She's been trying to wear a knee brace daily for support.  Social History   Social History  . Marital status: Married    Spouse name: [name]  . Number of children: 8  . Years of education: college   Occupational History  . Homemaker    Social History Main Topics  . Smoking status: Former Smoker    Types: Cigarettes    Quit date: 01/27/2004  . Smokeless tobacco: Never Used  . Alcohol use No  . Drug use: No  . Sexual activity: Yes    Partners: Male   Other Topics Concern  . Not on file   Social History Narrative  . No narrative on file   Health  Maintenance  Topic Date Due  . TETANUS/TDAP  01/22/2012  . PAP SMEAR  07/02/2016  . INFLUENZA VACCINE  08/21/2016  . COLONOSCOPY  02/04/2021  . HIV Screening  Completed    The following portions of the patient's history were reviewed and updated as appropriate: allergies, current medications, past family history, past medical history, past social history, past surgical history and problem list.  Review of Systems A comprehensive review of systems was negative.   Objective:    BP 120/63   Pulse 75   Ht  (1.676 m)   Wt 289 lb (131.1 kg)   SpO2 100%   BMI 46.65 kg/m  General appearance: alert, cooperative and appears stated age Head: Normocephalic, without obvious abnormality, atraumatic Eyes: conj claer, EOMI, PEERLA Ears: normal TM's and external ear canals both ears Nose: Nares normal. Septum midline. Mucosa normal. No drainage or sinus tenderness. Throat: lips, mucosa, and tongue normal; teeth and gums normal Neck: no adenopathy, no carotid bruit, no JVD, supple, symmetrical, trachea midline and thyroid not enlarged, symmetric, no tenderness/mass/nodules Back: symmetric, no curvature. ROM normal. No CVA tenderness. Lungs: clear to auscultation bilaterally Breasts: normal appearance, no masses or tenderness Heart: regular rate and rhythm, S1, S2 normal, no murmur, click, rub or gallop Abdomen: soft, non-tender; bowel sounds normal; no masses,  no organomegaly Extremities: extremities normal, atraumatic, no  cyanosis or edema Pulses: 2+ and symmetric Skin: Skin color, texture, turgor normal. No rashes or lesions Lymph nodes: Cervical, supraclavicular, and axillary nodes normal. Neurologic: Alert and oriented X 3, normal strength and tone. Normal symmetric reflexes. Normal coordination and gait    Left knee-Patient was wearing brace into the room today. She did remove it for the exam. She is only able to flex to about 45 and is unable to fully extend the leg straight.  Should some mild tenderness along the medial joint line. Negative McMurray's but had discomfort with that test. No significant anterior drawer laxity. Significant discomfort with varus and valgus stress on the knee. No significant swelling on exam.   Assessment:    Healthy female exam.      Plan:     See After Visit Summary for Counseling Recommendations   Keep up a regular exercise program and make sure you are eating a healthy diet Try to eat 4 servings of dairy a day, or if you are lactose intolerant take a calcium with vitamin D daily.  Your vaccines are up to date. Tdap given today.    Left knee pain - because of persistent swelling, instability and dysfunction of the joint recommend MRI for further evaluation. She has failed more than 6 weeks of conservative therapy including anti-inflammatories and physical therapy.  Significant family history for cancer.After reviewing her family history she has a mother with colon cancer diagnosed age 39, maternal grandmother with colon cancer diagnosed age 39, maternal great-grandmother diagnosed with colon cancer age 39.Father diagnosed with lung cancer age 39, maternal aunt diagnosed with breast cancer in her 5720s and ovarian cancer in her 5520s.I think she'll be a good candidate for genetic testing so we'll investigate if she would benefit from testing for Colaris.

## 2016-10-03 NOTE — Patient Instructions (Addendum)

## 2016-10-07 ENCOUNTER — Ambulatory Visit (INDEPENDENT_AMBULATORY_CARE_PROVIDER_SITE_OTHER): Payer: BLUE CROSS/BLUE SHIELD

## 2016-10-07 DIAGNOSIS — S8992XA Unspecified injury of left lower leg, initial encounter: Secondary | ICD-10-CM

## 2016-10-07 DIAGNOSIS — S83232A Complex tear of medial meniscus, current injury, left knee, initial encounter: Secondary | ICD-10-CM

## 2016-10-07 DIAGNOSIS — X58XXXA Exposure to other specified factors, initial encounter: Secondary | ICD-10-CM | POA: Diagnosis not present

## 2016-10-07 DIAGNOSIS — M25562 Pain in left knee: Secondary | ICD-10-CM

## 2016-10-07 DIAGNOSIS — S8002XA Contusion of left knee, initial encounter: Secondary | ICD-10-CM | POA: Diagnosis not present

## 2016-10-07 DIAGNOSIS — S83512A Sprain of anterior cruciate ligament of left knee, initial encounter: Secondary | ICD-10-CM

## 2016-10-07 DIAGNOSIS — S7012XA Contusion of left thigh, initial encounter: Secondary | ICD-10-CM | POA: Diagnosis not present

## 2016-10-07 DIAGNOSIS — S83511A Sprain of anterior cruciate ligament of right knee, initial encounter: Secondary | ICD-10-CM

## 2016-10-09 NOTE — Plan of Care (Signed)
Error

## 2016-10-16 ENCOUNTER — Telehealth: Payer: Self-pay | Admitting: Family Medicine

## 2016-10-16 ENCOUNTER — Encounter: Payer: Self-pay | Admitting: Family Medicine

## 2016-10-16 NOTE — Telephone Encounter (Signed)
Call patient: Please let her know that hopefully she was able to get in with the orthopedist or has an upcoming appointment. I also wanted to let her know after reviewing her family history genetics for colon cancer etc. I do think she would actually be a candidate to have the genetic testing done. It can actually be done here in the office with a blood draw and then we just fill out the paperwork which includes her family history. The actual contact her insurance to make sure that it's covered before they run the test. If it's not covered then they will contact you and let you know what the expense is potentially and whether or not you want to have it run. They've actually done some updates on the genetic testing in the last year and so it's more sophisticated than ever. If she would like to schedule this we can  schedule it is a nurse visit.  Family History  Problem Relation Age of Onset  . Autism spectrum disorder Daughter   . Other Daughter        Pancreatic insufficiency.   . Colon cancer Mother 53       died at age 77  . Colon polyps Mother   . Colon cancer Maternal Grandmother 45  . Colon cancer Other 89       mat great grand mother  . Lung cancer Father 59       smoker  . Heart attack Maternal Grandfather   . Hypertension Maternal Grandfather   . Diabetes Paternal Grandmother        type 1  . Breast cancer Maternal Aunt 20  . Ovarian cancer Maternal Aunt   . Other Maternal Aunt        reproductive issues  . Colon cancer Maternal Uncle   . Chiari malformation Sister

## 2016-10-17 NOTE — Telephone Encounter (Signed)
Patient notified and transferred to scheduling.

## 2016-10-24 DIAGNOSIS — S83412A Sprain of medial collateral ligament of left knee, initial encounter: Secondary | ICD-10-CM | POA: Diagnosis not present

## 2016-10-24 DIAGNOSIS — S83512A Sprain of anterior cruciate ligament of left knee, initial encounter: Secondary | ICD-10-CM | POA: Diagnosis not present

## 2016-10-28 DIAGNOSIS — R2689 Other abnormalities of gait and mobility: Secondary | ICD-10-CM | POA: Diagnosis not present

## 2016-10-28 DIAGNOSIS — S83512S Sprain of anterior cruciate ligament of left knee, sequela: Secondary | ICD-10-CM | POA: Diagnosis not present

## 2016-10-28 DIAGNOSIS — M25562 Pain in left knee: Secondary | ICD-10-CM | POA: Diagnosis not present

## 2016-10-28 DIAGNOSIS — M25462 Effusion, left knee: Secondary | ICD-10-CM | POA: Diagnosis not present

## 2016-10-31 DIAGNOSIS — R2689 Other abnormalities of gait and mobility: Secondary | ICD-10-CM | POA: Diagnosis not present

## 2016-10-31 DIAGNOSIS — M25462 Effusion, left knee: Secondary | ICD-10-CM | POA: Diagnosis not present

## 2016-10-31 DIAGNOSIS — S83512S Sprain of anterior cruciate ligament of left knee, sequela: Secondary | ICD-10-CM | POA: Diagnosis not present

## 2016-10-31 DIAGNOSIS — M25562 Pain in left knee: Secondary | ICD-10-CM | POA: Diagnosis not present

## 2016-11-05 DIAGNOSIS — R2689 Other abnormalities of gait and mobility: Secondary | ICD-10-CM | POA: Diagnosis not present

## 2016-11-05 DIAGNOSIS — M25562 Pain in left knee: Secondary | ICD-10-CM | POA: Diagnosis not present

## 2016-11-05 DIAGNOSIS — S83512S Sprain of anterior cruciate ligament of left knee, sequela: Secondary | ICD-10-CM | POA: Diagnosis not present

## 2016-11-05 DIAGNOSIS — M25462 Effusion, left knee: Secondary | ICD-10-CM | POA: Diagnosis not present

## 2016-11-08 DIAGNOSIS — M25562 Pain in left knee: Secondary | ICD-10-CM | POA: Diagnosis not present

## 2016-11-08 DIAGNOSIS — S83512S Sprain of anterior cruciate ligament of left knee, sequela: Secondary | ICD-10-CM | POA: Diagnosis not present

## 2016-11-08 DIAGNOSIS — M25462 Effusion, left knee: Secondary | ICD-10-CM | POA: Diagnosis not present

## 2016-11-08 DIAGNOSIS — R2689 Other abnormalities of gait and mobility: Secondary | ICD-10-CM | POA: Diagnosis not present

## 2016-11-11 DIAGNOSIS — S83512S Sprain of anterior cruciate ligament of left knee, sequela: Secondary | ICD-10-CM | POA: Diagnosis not present

## 2016-11-11 DIAGNOSIS — M25562 Pain in left knee: Secondary | ICD-10-CM | POA: Diagnosis not present

## 2016-11-11 DIAGNOSIS — R2689 Other abnormalities of gait and mobility: Secondary | ICD-10-CM | POA: Diagnosis not present

## 2016-11-11 DIAGNOSIS — M25462 Effusion, left knee: Secondary | ICD-10-CM | POA: Diagnosis not present

## 2016-11-15 DIAGNOSIS — M25562 Pain in left knee: Secondary | ICD-10-CM | POA: Diagnosis not present

## 2016-11-15 DIAGNOSIS — S83512S Sprain of anterior cruciate ligament of left knee, sequela: Secondary | ICD-10-CM | POA: Diagnosis not present

## 2016-11-15 DIAGNOSIS — M25462 Effusion, left knee: Secondary | ICD-10-CM | POA: Diagnosis not present

## 2016-11-15 DIAGNOSIS — R2689 Other abnormalities of gait and mobility: Secondary | ICD-10-CM | POA: Diagnosis not present

## 2016-11-19 DIAGNOSIS — M25562 Pain in left knee: Secondary | ICD-10-CM | POA: Diagnosis not present

## 2016-11-19 DIAGNOSIS — M25462 Effusion, left knee: Secondary | ICD-10-CM | POA: Diagnosis not present

## 2016-11-19 DIAGNOSIS — S83512S Sprain of anterior cruciate ligament of left knee, sequela: Secondary | ICD-10-CM | POA: Diagnosis not present

## 2016-11-19 DIAGNOSIS — R2689 Other abnormalities of gait and mobility: Secondary | ICD-10-CM | POA: Diagnosis not present

## 2016-11-21 DIAGNOSIS — S83412A Sprain of medial collateral ligament of left knee, initial encounter: Secondary | ICD-10-CM | POA: Diagnosis not present

## 2016-12-02 DIAGNOSIS — M23232 Derangement of other medial meniscus due to old tear or injury, left knee: Secondary | ICD-10-CM | POA: Diagnosis not present

## 2016-12-02 DIAGNOSIS — M948X6 Other specified disorders of cartilage, lower leg: Secondary | ICD-10-CM | POA: Diagnosis not present

## 2016-12-02 DIAGNOSIS — M659 Synovitis and tenosynovitis, unspecified: Secondary | ICD-10-CM | POA: Diagnosis not present

## 2016-12-02 DIAGNOSIS — S83242A Other tear of medial meniscus, current injury, left knee, initial encounter: Secondary | ICD-10-CM | POA: Diagnosis not present

## 2016-12-10 DIAGNOSIS — S83242D Other tear of medial meniscus, current injury, left knee, subsequent encounter: Secondary | ICD-10-CM | POA: Diagnosis not present

## 2017-01-10 DIAGNOSIS — S83242D Other tear of medial meniscus, current injury, left knee, subsequent encounter: Secondary | ICD-10-CM | POA: Diagnosis not present

## 2017-01-26 ENCOUNTER — Emergency Department (HOSPITAL_BASED_OUTPATIENT_CLINIC_OR_DEPARTMENT_OTHER)
Admission: EM | Admit: 2017-01-26 | Discharge: 2017-01-26 | Disposition: A | Discharge status: Home / Self Care | Payer: BLUE CROSS/BLUE SHIELD | Attending: Emergency Medicine | Admitting: Emergency Medicine

## 2017-01-26 ENCOUNTER — Emergency Department (HOSPITAL_BASED_OUTPATIENT_CLINIC_OR_DEPARTMENT_OTHER): Payer: BLUE CROSS/BLUE SHIELD

## 2017-01-26 ENCOUNTER — Encounter (HOSPITAL_BASED_OUTPATIENT_CLINIC_OR_DEPARTMENT_OTHER): Payer: Self-pay | Admitting: *Deleted

## 2017-01-26 ENCOUNTER — Other Ambulatory Visit: Payer: Self-pay

## 2017-01-26 DIAGNOSIS — R519 Headache, unspecified: Secondary | ICD-10-CM

## 2017-01-26 DIAGNOSIS — Z79899 Other long term (current) drug therapy: Secondary | ICD-10-CM | POA: Diagnosis not present

## 2017-01-26 DIAGNOSIS — Z87891 Personal history of nicotine dependence: Secondary | ICD-10-CM | POA: Insufficient documentation

## 2017-01-26 DIAGNOSIS — R05 Cough: Secondary | ICD-10-CM | POA: Diagnosis not present

## 2017-01-26 DIAGNOSIS — R51 Headache: Secondary | ICD-10-CM | POA: Insufficient documentation

## 2017-01-26 DIAGNOSIS — M542 Cervicalgia: Secondary | ICD-10-CM

## 2017-01-26 DIAGNOSIS — R69 Illness, unspecified: Secondary | ICD-10-CM

## 2017-01-26 DIAGNOSIS — J111 Influenza due to unidentified influenza virus with other respiratory manifestations: Secondary | ICD-10-CM

## 2017-01-26 DIAGNOSIS — R6889 Other general symptoms and signs: Secondary | ICD-10-CM

## 2017-01-26 HISTORY — DX: Sprain of anterior cruciate ligament of unspecified knee, initial encounter: S83.519A

## 2017-01-26 LAB — COMPREHENSIVE METABOLIC PANEL
ALK PHOS: 68 U/L (ref 38–126)
ALT: 36 U/L (ref 14–54)
ANION GAP: 9 (ref 5–15)
AST: 27 U/L (ref 15–41)
Albumin: 3.9 g/dL (ref 3.5–5.0)
BILIRUBIN TOTAL: 0.4 mg/dL (ref 0.3–1.2)
BUN: 12 mg/dL (ref 6–20)
CALCIUM: 9 mg/dL (ref 8.9–10.3)
CO2: 22 mmol/L (ref 22–32)
CREATININE: 0.67 mg/dL (ref 0.44–1.00)
Chloride: 101 mmol/L (ref 101–111)
GFR calc non Af Amer: >60 mL/min (ref >60)
Glucose, Bld: 114 mg/dL — ABNORMAL HIGH (ref 65–99)
Potassium: 4 mmol/L (ref 3.5–5.1)
Sodium: 132 mmol/L — ABNORMAL LOW (ref 135–145)
TOTAL PROTEIN: 8.2 g/dL — AB (ref 6.5–8.1)

## 2017-01-26 LAB — CBC WITH DIFFERENTIAL/PLATELET
BASOS ABS: 0 10*3/uL (ref 0.0–0.1)
BASOS PCT: 0 %
EOS ABS: 0.2 10*3/uL (ref 0.0–0.7)
Eosinophils Relative: 3 %
HEMATOCRIT: 38.1 % (ref 36.0–46.0)
Hemoglobin: 12.4 g/dL (ref 12.0–15.0)
Lymphocytes Relative: 20 %
Lymphs Abs: 1.8 10*3/uL (ref 0.7–4.0)
MCH: 26.6 pg (ref 26.0–34.0)
MCHC: 32.5 g/dL (ref 30.0–36.0)
MCV: 81.8 fL (ref 78.0–100.0)
MONO ABS: 0.8 10*3/uL (ref 0.1–1.0)
MONOS PCT: 9 %
NEUTROS ABS: 6.3 10*3/uL (ref 1.7–7.7)
Neutrophils Relative %: 68 %
PLATELETS: 408 10*3/uL — AB (ref 150–400)
RBC: 4.66 MIL/uL (ref 3.87–5.11)
RDW: 14.9 % (ref 11.5–15.5)
WBC: 9.2 10*3/uL (ref 4.0–10.5)

## 2017-01-26 LAB — PREGNANCY, URINE: PREG TEST UR: NEGATIVE

## 2017-01-26 MED ORDER — KETOROLAC TROMETHAMINE 15 MG/ML IJ SOLN
15.0000 mg | Freq: Once | INTRAMUSCULAR | Status: AC
  Administered 2017-01-26: 15 mg via INTRAVENOUS
  Filled 2017-01-26: qty 1

## 2017-01-26 MED ORDER — DIPHENHYDRAMINE HCL 50 MG/ML IJ SOLN: 25.0000 mg | Freq: Once | INTRAMUSCULAR | Status: DC

## 2017-01-26 MED ORDER — ACETAMINOPHEN ER 650 MG PO TBCR: 650.0000 mg | EXTENDED_RELEASE_TABLET | Freq: Three times a day (TID) | ORAL | 0 refills | Status: DC | PRN

## 2017-01-26 MED ORDER — IBUPROFEN 600 MG PO TABS: 600.0000 mg | ORAL_TABLET | Freq: Four times a day (QID) | ORAL | 0 refills | Status: DC | PRN

## 2017-01-26 MED ORDER — DIPHENHYDRAMINE HCL 50 MG/ML IJ SOLN
INTRAMUSCULAR | Status: AC
  Filled 2017-01-26: qty 1

## 2017-01-26 MED ORDER — METOCLOPRAMIDE HCL 5 MG/ML IJ SOLN
10.0000 mg | Freq: Once | INTRAMUSCULAR | Status: AC
  Administered 2017-01-26: 10 mg via INTRAVENOUS
  Filled 2017-01-26: qty 2

## 2017-01-26 MED ORDER — LACTATED RINGERS IV BOLUS (SEPSIS)
1000.0000 mL | Freq: Once | INTRAVENOUS | Status: AC
  Administered 2017-01-26: 1000 mL via INTRAVENOUS

## 2017-01-26 NOTE — ED Triage Notes (Signed)
Patient states she has a three day history of fever and cough.  Max temperature at home 102.  Productive cough with clear secretions.  Temperature broke yesterday.  States she developed a headache last night which has progressively gotten worse.  Now has neck soreness too.  States this morning she has coughed up blood colored secretions, approximately 1 teaspoon x 3.  States she has a sick child at home with pneumonia vs RSV, (was Flu tested Negative).

## 2017-01-26 NOTE — ED Provider Notes (Addendum)
[facility] EMERGENCY DEPARTMENT Provider Note   CSN: [id number] Arrival date & time: 01/26/17  0945     History   Chief Complaint Chief Complaint  Patient presents with  . Influenza    HPI [name] is a 42 y.o. female.  HPI  42 year old female with no significant medical history comes in with chief complaint of headaches, flulike illness. Patient reports that she started getting sick about 3 or 4 days ago.  Patient started developing a cough, with clear phlegm.  Patient also started having temperatures up to 102, which stopped a day ago.  Patient over time has developed a headache and neck pain.  Patient denies any neck stiffness, or sore throat.  Patient reports that the headache started last night, and it is constant, dull pain that is located in the posterior part of her head and the upper part of her neck.  Pain is no specific precipitating factor, or aggravating or relieving factors.  Pt has no associated nausea, vomiting, seizures, loss of consciousness or new visual complains, weakness, numbness, dizziness or gait instability.  Patient took tramadol and hot showers without any relief.  Patient has been taking over-the-counter flu medicine.  She also reports that she has family members who have flulike illness.  Past Medical History:  Diagnosis Date  . ACL (anterior cruciate ligament) tear    MCL. LCL Left knee  . ADHD (attention deficit hyperactivity disorder)    husband  . Anemia   . Chromosomal abnormality   . Congenital abnormality   . HSV (herpes simplex virus) anogenital infection   . Kidney stone on left side   . Miscarriage    x 8  . Plantar fasciitis     Patient Active Problem List   Diagnosis Date Noted  . Family history of colon cancer 12/12/2015  . Plantar fasciitis, bilateral 08/01/2015  . Short interval between pregnancies affecting pregnancy, antepartum 10/01/2013  . Ureteral calculus, left 04/12/2011  . Chromosomal abnormality  11/19/2010  . Obesity 10/01/2010  . HSV 10/03/2006  . ATTENTION DEFICIT, W/HYPERACTIVITY 10/29/2005  . Lupus anticoagulant positive 10/29/2005    Past Surgical History:  Procedure Laterality Date  . arm surgery for fracture    . DILATION AND CURETTAGE OF UTERUS  1998  . KNEE SURGERY Left    torn menicus  . TONSILLECTOMY AND ADENOIDECTOMY    . WISDOM TOOTH EXTRACTION      OB History    Gravida Para Term Preterm AB Living   17 7 6 1 9 7    SAB TAB Ectopic Multiple Live Births   8 1 0 0 6       Home Medications    Prior to Admission medications   Medication Sig Start Date End Date Taking? Authorizing Provider  meloxicam (MOBIC) 15 MG tablet Take 15 mg by mouth daily. 09/11/16  Yes [provider]  acetaminophen (TYLENOL 8 HOUR) 650 MG CR tablet Take 1 tablet (650 mg total) by mouth every 8 (eight) hours as needed for pain. 01/26/17   Derwood Kaplan, MD  ibuprofen (ADVIL,MOTRIN) 600 MG tablet Take 1 tablet (600 mg total) by mouth every 6 (six) hours as needed. 01/26/17   Derwood Kaplan, MD  MULTIPLE VITAMIN PO Take 1 tablet by mouth daily.    [provider]  valACYclovir (VALTREX) 1000 MG tablet Take 1 tablet (1,000 mg total) by mouth daily. X 5 days 03/28/16   Agapito Games, MD    Family History Family History  Problem Relation Age of Onset  . Autism spectrum disorder Daughter   . Other Daughter        Pancreatic insufficiency.   . Colon cancer Mother 16       died at age 74  . Colon polyps Mother   . Colon cancer Maternal Grandmother 27  . Colon cancer Other 89       mat great grand mother  . Lung cancer Father 75       smoker  . Heart attack Maternal Grandfather   . Hypertension Maternal Grandfather   . Diabetes Paternal Grandmother        type 1  . Breast cancer Maternal Aunt 20  . Ovarian cancer Maternal Aunt   . Other Maternal Aunt        reproductive issues  . Colon cancer Maternal Uncle   . Chiari malformation Sister     Social  History Social History   Tobacco Use  . Smoking status: Former Smoker    Types: Cigarettes    Last attempt to quit: 01/27/2004    Years since quitting: 13.0  . Smokeless tobacco: Never Used  Substance Use Topics  . Alcohol use: No  . Drug use: No     Allergies   Patient has no known allergies.   Review of Systems Review of Systems  Constitutional: Positive for activity change.  HENT: Positive for congestion. Negative for rhinorrhea, sinus pressure, sinus pain, sneezing, sore throat and trouble swallowing.   Respiratory: Positive for cough.   Musculoskeletal: Positive for neck pain. Negative for myalgias and neck stiffness.  Skin: Negative for rash.  Neurological: Negative for headaches.     Physical Exam Updated Vital Signs BP 111/71 (BP Location: Left Arm)   Pulse 78   Temp 98 F (36.7 C) (Oral)   Resp 18   Ht 5\' 6"  (1.676 m)   Wt 136.1 kg (300 lb)   LMP 01/04/2017 (Exact Date)   SpO2 99%   BMI 48.42 kg/m   Physical Exam  Constitutional: She is oriented to person, place, and time. She appears well-developed.  HENT:  Head: Normocephalic and atraumatic.  Eyes: EOM are normal.  Neck: Normal range of motion. Neck supple.  Pt able to turn head to 45 degrees bilaterally without any significant discomfort, and able to flex neck to the chest. No meningismus   Cardiovascular: Normal rate.  Pulmonary/Chest: Effort normal. She has wheezes.  Mild end expiratory wheez in the right lower lung field  Abdominal: Bowel sounds are normal.  Lymphadenopathy:    She has cervical adenopathy.  Neurological: She is alert and oriented to person, place, and time. No cranial nerve deficit. Coordination normal.  Sensory exam normal for bilateral upper and lower extremities - and patient is able to discriminate between sharp and dull. Motor exam is 4+/5   Skin: Skin is warm and dry.  Nursing note and vitals reviewed.    ED Treatments / Results  Labs (all labs ordered are  listed, but only abnormal results are displayed) Labs Reviewed  COMPREHENSIVE METABOLIC PANEL - Abnormal; Notable for the following components:      Result Value   Sodium 132 (*)    Glucose, Bld 114 (*)    Total Protein 8.2 (*)    All other components within normal limits  CBC WITH DIFFERENTIAL/PLATELET - Abnormal; Notable for the following components:   Platelets 408 (*)    All other components within normal limits  PREGNANCY, URINE    EKG  EKG Interpretation  Date/Time:  Sunday January 26 2017 10:55:47 EST Ventricular Rate:  81 PR Interval:    QRS Duration: 83 QT Interval:  371 QTC Calculation: 431 R Axis:   61 Text Interpretation:  Sinus rhythm No acute changes No significant change since last tracing Confirmed by Derwood KaplanNanavati, Earla Charlie [phone]([id number]) on 01/26/2017 11:01:09 AM       Radiology Dg Chest 2 View  Result Date: 01/26/2017 CLINICAL DATA:  42 year old female with productive cough and headache for the past 3- 4 days EXAM: CHEST  2 VIEW COMPARISON:  Prior chest x-ray 10/06/2015 FINDINGS: The lungs are clear and negative for focal airspace consolidation, pulmonary edema or suspicious pulmonary nodule. No pleural effusion or pneumothorax. Cardiac and mediastinal contours are within normal limits. No acute fracture or lytic or blastic osseous lesions. The visualized upper abdominal bowel gas pattern is unremarkable. IMPRESSION: Negative chest x-ray Electronically Signed   By: Malachy MoanHeath  McCullough M.D.   On: 01/26/2017 11:09    Procedures Procedures (including critical care time)  Medications Ordered in ED Medications  diphenhydrAMINE (BENADRYL) injection 25 mg (not administered)  ketorolac (TORADOL) 15 MG/ML injection 15 mg (15 mg Intravenous Given 01/26/17 1113)  metoCLOPramide (REGLAN) injection 10 mg (10 mg Intravenous Given 01/26/17 1115)  lactated ringers bolus 1,000 mL (1,000 mLs Intravenous New Bag/Given 01/26/17 1103)     Initial Impression / Assessment and Plan / ED Course  I  have reviewed the triage vital signs and the nursing notes.  Pertinent labs & imaging results that were available during my care of the patient were reviewed by me and considered in my medical decision making (see chart for details).  Clinical Course as of Jan 27 1308  Sun Jan 26, 2017  1255 WBC: 9.2 [AN]  1305 Patient's headache improved from 8 out of 10 to 5 out of 10. Patient did have EPS type reaction with Reglan, that resolved with Benadryl. Patient's labs are reassuring and the results of the workup have been discussed with the patient. Pt continued to have no neurologic complains. Strict return precautions discussed, pt will return to the ER if there is visual complains, seizures, altered mental status, loss of consciousness, dizziness, new focal weakness, or numbness. Husband is fine with the plan as well.    [AN]    Clinical Course User Index [AN] Derwood KaplanNanavati, Hanifah Royse, MD    42 year old immunocompetent patient comes in with chief complaint of headache and URI-like symptoms.  Patient has been sick for the last 3 days, with cough, congestion and temperature as high as 102.  Patient's fever have seized over the last day, however she now has headache and neck pain.  Patient's symptoms are located over the posterior part of the head and the vertex of the head, and the upper part of the neck.  Patient is not toxic appearing.  Differential diagnosis includes viral illness, including viral meningitis.  We also considered brain bleed, dural thrombosis, pneumonia, carotid dissection in the differential diagnosis.  However, patient has no focal neurologic deficits or complaints, no risk factors for dissection or thrombosis, and patient is having acute infection-like illness associated with the headaches -so we suspect that most likely patient has viral illness with associated tension type headache.  Plan is to get basic labs, to screen for any severe infectious etiology. We will also get a chest x-ray  to evaluate for pneumonia.  Lung exam was mostly clear except for very fine end expiratory wheezing on the right lower lung field  If patient is to be discharged, we will discussed strict return precautions for the headaches.  Final Clinical Impressions(s) / ED Diagnoses   Final diagnoses:  Flu-like symptoms  Influenza-like illness  Bad headache  Neck pain    ED Discharge Orders        Ordered    ibuprofen (ADVIL,MOTRIN) 600 MG tablet  Every 6 hours PRN     01/26/17 1258    acetaminophen (TYLENOL 8 HOUR) 650 MG CR tablet  Every 8 hours PRN     01/26/17 1258       Derwood Kaplan, MD 01/26/17 1310    Derwood Kaplan, MD 01/26/17 1311

## 2017-01-26 NOTE — Discharge Instructions (Signed)
We think what you have is a viral syndrome - the treatment for which is symptomatic relief only, and your body will fight the infection off in a few days. We are prescribing you some meds for pain and fevers. See your primary care doctor in 1 week if the symptoms dont improve.  Please return to the ER if the headache gets severe and in not improving, you have associated new one sided numbness, tingling, weakness, confusion, seizures, poor balance or poor vision. Also return to the ER if there is severe neck stiffness with high fevers.

## 2017-01-30 ENCOUNTER — Emergency Department
Admission: EM | Admit: 2017-01-30 | Discharge: 2017-01-30 | Disposition: A | Discharge status: Home / Self Care | Payer: BLUE CROSS/BLUE SHIELD | Source: Home / Self Care | Attending: Family Medicine | Admitting: Family Medicine

## 2017-01-30 ENCOUNTER — Other Ambulatory Visit: Payer: Self-pay

## 2017-01-30 ENCOUNTER — Emergency Department (INDEPENDENT_AMBULATORY_CARE_PROVIDER_SITE_OTHER): Payer: BLUE CROSS/BLUE SHIELD

## 2017-01-30 ENCOUNTER — Encounter: Payer: Self-pay | Admitting: *Deleted

## 2017-01-30 DIAGNOSIS — J209 Acute bronchitis, unspecified: Secondary | ICD-10-CM

## 2017-01-30 DIAGNOSIS — R05 Cough: Secondary | ICD-10-CM

## 2017-01-30 DIAGNOSIS — J9801 Acute bronchospasm: Secondary | ICD-10-CM

## 2017-01-30 LAB — POCT CBC W AUTO DIFF (K'VILLE URGENT CARE)

## 2017-01-30 MED ORDER — IPRATROPIUM-ALBUTEROL 0.5-2.5 (3) MG/3ML IN SOLN: 3.0000 mL | Freq: Four times a day (QID) | RESPIRATORY_TRACT | Status: DC

## 2017-01-30 MED ORDER — IPRATROPIUM-ALBUTEROL 0.5-2.5 (3) MG/3ML IN SOLN
3.0000 mL | Freq: Once | RESPIRATORY_TRACT | Status: AC
  Administered 2017-01-30: 3 mL via RESPIRATORY_TRACT

## 2017-01-30 MED ORDER — ALBUTEROL SULFATE HFA 108 (90 BASE) MCG/ACT IN AERS: 2.0000 | INHALATION_SPRAY | RESPIRATORY_TRACT | 0 refills | Status: DC | PRN

## 2017-01-30 MED ORDER — METHYLPREDNISOLONE SODIUM SUCC 125 MG IJ SOLR
80.0000 mg | Freq: Once | INTRAMUSCULAR | Status: AC
  Administered 2017-01-30: 80 mg via INTRAMUSCULAR

## 2017-01-30 MED ORDER — BENZONATATE 200 MG PO CAPS: ORAL_CAPSULE | ORAL | 0 refills | Status: DC

## 2017-01-30 MED ORDER — PREDNISONE 20 MG PO TABS: ORAL_TABLET | ORAL | 0 refills | Status: DC

## 2017-01-30 MED ORDER — AZITHROMYCIN 250 MG PO TABS: ORAL_TABLET | ORAL | 0 refills | Status: DC

## 2017-01-30 NOTE — ED Triage Notes (Signed)
Patient c/o >1 week of cough. More recently SOB, wheezing HA and sore throat. [facility].

## 2017-01-30 NOTE — Discharge Instructions (Addendum)
Begin prednisone Friday 01/31/17 Take plain guaifenesin (1200mg  extended release tabs such as Mucinex) twice daily, with plenty of water, for cough and congestion.  May add Pseudoephedrine (30mg , one or two every 4 to 6 hours) for sinus congestion.  Get adequate rest.   May use Afrin nasal spray (or generic oxymetazoline) each morning for about 5 days and then discontinue.  Also recommend using saline nasal spray several times daily and saline nasal irrigation (AYR is a common brand).  Use Flonase nasal spray each morning after using Afrin nasal spray and saline nasal irrigation. Try warm salt water gargles for sore throat.  Stop all antihistamines for now, and other non-prescription cough/cold preparations.

## 2017-01-30 NOTE — ED Provider Notes (Signed)
[facility]    CSN: [id number] Arrival date & time: 01/30/17  1716     History   Chief Complaint Chief Complaint  Patient presents with  . Cough  . Sore Throat  . Shortness of Breath    HPI [name] is a 42 y.o. female.   Patient reports that she developed a non-productive cough about 9 days ago.  Three days later she developed flu-like symptoms with fever/chills/sweats.  Four days ago she visited the ER where chest x-ray and CBC were normal, and she was diagnosed with viral URI.  During the past 2 days she has developed increasing tightness in her anterior chest with wheezing and sensation of shortness of breath.  Her wheezing is most pronounced when she is supine.  She has had similar symptoms in the past improved with albuterol by nebulizer.  She denies past history of asthma, or family history of asthma.  Patient has significant past history of smoking.   The history is provided by the patient.    Past Medical History:  Diagnosis Date  . ACL (anterior cruciate ligament) tear    MCL. LCL Left knee  . ADHD (attention deficit hyperactivity disorder)    husband  . Anemia   . Chromosomal abnormality   . Congenital abnormality   . HSV (herpes simplex virus) anogenital infection   . Kidney stone on left side   . Miscarriage    x 8  . Plantar fasciitis     Patient Active Problem List   Diagnosis Date Noted  . Family history of colon cancer 12/12/2015  . Plantar fasciitis, bilateral 08/01/2015  . Short interval between pregnancies affecting pregnancy, antepartum 10/01/2013  . Ureteral calculus, left 04/12/2011  . 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 Obesity 10/01/2010  . HSV 10/03/2006  . ATTENTION DEFICIT, W/HYPERACTIVITY 10/29/2005  . Lupus anticoagulant positive 10/29/2005    Past Surgical History:  Procedure Laterality Date  . arm surgery for fracture    . DILATION AND CURETTAGE OF UTERUS  1998  . KNEE SURGERY Left    torn menicus  .  TONSILLECTOMY AND ADENOIDECTOMY    . WISDOM TOOTH EXTRACTION      OB History    Gravida Para Term Preterm AB Living   17 7 6 1 9 7    SAB TAB Ectopic Multiple Live Births   8 1 0 0 6       Home Medications    Prior to Admission medications   Medication Sig Start Date End Date Taking? Authorizing Provider  acetaminophen (TYLENOL 8 HOUR) 650 MG CR tablet Take 1 tablet (650 mg total) by mouth every 8 (eight) hours as needed for pain. 01/26/17   Derwood Kaplan, MD  albuterol (PROVENTIL HFA;VENTOLIN HFA) 108 (90 Base) MCG/ACT inhaler Inhale 2 puffs into the lungs every 4 (four) hours as needed for wheezing or shortness of breath. 01/30/17   Lattie Haw, MD  azithromycin (ZITHROMAX Z-PAK) 250 MG tablet Take 2 tabs today; then begin one tab once daily for 4 more days. 01/30/17   Lattie Haw, MD  benzonatate (TESSALON) 200 MG capsule Take one cap by mouth at bedtime as needed for cough.  May repeat in 4 to 6 hours 01/30/17   Lattie Haw, MD  ibuprofen (ADVIL,MOTRIN) 600 MG tablet Take 1 tablet (600 mg total) by mouth every 6 (six) hours as needed. 01/26/17   Derwood Kaplan, MD  meloxicam (MOBIC) 15 MG tablet Take 15 mg by mouth daily.  09/11/16   [provider]  MULTIPLE VITAMIN PO Take 1 tablet by mouth daily.    [provider]  predniSONE (DELTASONE) 20 MG tablet Take one tab by mouth twice daily for 4 days, then one daily for 3 days. Take with food. 01/30/17   Lattie Haw, MD  valACYclovir (VALTREX) 1000 MG tablet Take 1 tablet (1,000 mg total) by mouth daily. X 5 days 03/28/16   Agapito Games, MD    Family History Family History  Problem Relation Age of Onset  . Autism spectrum disorder Daughter   . Other Daughter        Pancreatic insufficiency.   . Colon cancer Mother 44       died at age 65  . Colon polyps Mother   . Colon cancer Maternal Grandmother 62  . Colon cancer Other 89       mat great grand mother  . Lung cancer Father 36        smoker  . Heart attack Maternal Grandfather   . Hypertension Maternal Grandfather   . Diabetes Paternal Grandmother        type 1  . Breast cancer Maternal Aunt 20  . Ovarian cancer Maternal Aunt   . Other Maternal Aunt        reproductive issues  . Colon cancer Maternal Uncle   . Chiari malformation Sister     Social History Social History   Tobacco Use  . Smoking status: Former Smoker    Types: Cigarettes    Last attempt to quit: 01/27/2004    Years since quitting: 13.0  . Smokeless tobacco: Never Used  Substance Use Topics  . Alcohol use: No  . Drug use: No     Allergies   Nickel   Review of Systems Review of Systems + sore throat + cough No pleuritic pain + wheezing + nasal congestion + post-nasal drainage No sinus pain/pressure No itchy/red eyes No earache No hemoptysis + SOB No fever/chills No nausea No vomiting No abdominal pain No diarrhea No urinary symptoms No skin rash + fatigue No myalgias + headache Used OTC meds without relief   Physical Exam Triage Vital Signs ED Triage Vitals [01/30/17 1756]  Enc Vitals Group     BP 137/85     Pulse Rate (!) 106     Resp 16     Temp 99.1 F (37.3 C)     Temp Source Oral     SpO2 99 %     Weight 294 lb (133.4 kg)     Height      Head Circumference      Peak Flow      Pain Score 6     Pain Loc      Pain Edu?      Excl. in GC?    No data found.  Updated Vital Signs BP 137/85 (BP Location: Left Arm)   Pulse (!) 106   Temp 99.1 F (37.3 C) (Oral)   Resp 16   Wt 294 lb (133.4 kg)   LMP 01/04/2017 (Exact Date)   SpO2 99%   BMI 47.45 kg/m   Visual Acuity Right Eye Distance:   Left Eye Distance:   Bilateral Distance:    Right Eye Near:   Left Eye Near:    Bilateral Near:     Physical Exam Nursing notes and Vital Signs reviewed. Appearance:  Patient appears stated age, and in no acute distress Eyes:  Pupils are equal,  round, and reactive to light and accomodation.  Extraocular  movement is intact.  Conjunctivae are not inflamed  Ears:  Canals normal.  Tympanic membranes normal.  Nose:  Mildly congested turbinates.  No sinus tenderness.   Pharynx:  Normal Neck:  Supple.  Enlarged posterior/lateral nodes are palpated bilaterally, tender to palpation on the left.   Lungs:  Bilateral posterior expiratory wheezes.  Breath sounds are equal.  Moving air well.  Post nebulizer treatment with DuoNeb, wheezes still present but diminished and patient reports decreased dyspnea. Heart:  Regular rate and rhythm without murmurs, rubs, or gallops.  Abdomen:  Nontender without masses or hepatosplenomegaly.  Bowel sounds are present.  No CVA or flank tenderness.  Extremities:  No edema.  Skin:  No rash present.    UC Treatments / Results  Labs (all labs ordered are listed, but only abnormal results are displayed) Labs Reviewed  POCT CBC W AUTO DIFF ([facility]):  WBC 17.4; LY 20.5; MO 4.1; GR 75.4; Hgb 11.8; Platelets 389     EKG  EKG Interpretation None       Radiology Dg Chest 2 View  Result Date: 01/30/2017 CLINICAL DATA:  Persistent cough. EXAM: CHEST  2 VIEW COMPARISON:  01/26/2017 FINDINGS: Cardiomediastinal silhouette is normal. Mediastinal contours appear intact. There is no evidence of focal airspace consolidation, pleural effusion or pneumothorax. Mild peribronchial thickening bilaterally. Osseous structures are without acute abnormality. Soft tissues are grossly normal. IMPRESSION: Mild peribronchial thickening bilaterally which may be seen with acute bronchitis. No evidence of lobar consolidation. Electronically Signed   By: Ted Mcalpineobrinka  Dimitrova M.D.   On: 01/30/2017 19:10    Procedures Procedures (including critical care time)  Medications Ordered in UC Medications  methylPREDNISolone sodium succinate (SOLU-MEDROL) 125 mg/2 mL injection 80 mg (not administered)  ipratropium-albuterol (DUONEB) 0.5-2.5 (3) MG/3ML nebulizer solution 3 mL (3 mLs  Nebulization Given 01/30/17 1815)     Initial Impression / Assessment and Plan / UC Course  I have reviewed the triage vital signs and the nursing notes.  Pertinent labs & imaging results that were available during my care of the patient were reviewed by me and considered in my medical decision making (see chart for details).    Administered DuoNeb by hand held nebulizer. Administered Solumedrol 80mg  IM. Note leukocytosis 17.4. Begin Z-pak for atypical coverage. Rx for albuterol inhaler. Prescription written for Benzonatate [facility](Tessalon) to take at bedtime for night-time cough.  Begin prednisone Friday 01/31/17 Take plain guaifenesin (1200mg  extended release tabs such as Mucinex) twice daily, with plenty of water, for cough and congestion.  May add Pseudoephedrine (30mg , one or two every 4 to 6 hours) for sinus congestion.  Get adequate rest.   May use Afrin nasal spray (or generic oxymetazoline) each morning for about 5 days and then discontinue.  Also recommend using saline nasal spray several times daily and saline nasal irrigation (AYR is a common brand).  Use Flonase nasal spray each morning after using Afrin nasal spray and saline nasal irrigation. Try warm salt water gargles for sore throat.  Stop all antihistamines for now, and other non-prescription cough/cold preparations. Followup with Family Doctor if not improved in one week.     Final Clinical Impressions(s) / UC Diagnoses   Final diagnoses:  Acute bronchitis, unspecified organism  Bronchospasm, acute    ED Discharge Orders        Ordered    predniSONE (DELTASONE) 20 MG tablet     01/30/17 1932    albuterol (PROVENTIL  HFA;VENTOLIN HFA) 108 (90 Base) MCG/ACT inhaler  Every 4 hours PRN     01/30/17 1932    benzonatate (TESSALON) 200 MG capsule     01/30/17 1932    azithromycin (ZITHROMAX Z-PAK) 250 MG tablet     01/30/17 1932          Lattie Haw, MD 01/30/17 1936

## 2017-01-31 ENCOUNTER — Telehealth: Payer: Self-pay | Admitting: Family Medicine

## 2017-01-31 NOTE — Telephone Encounter (Signed)
I called pt to schedule her a nurse visit for genetic testing and pt states that she will call back to schedule when feeling better.  She also states that she got new insurance Jan 21, 2017 and she needs someone to check and make sure her NEW insurance will cover testing. Patient was seen in our urgent Care yesterday so her new insurance should be in the system.

## 2017-02-03 NOTE — Telephone Encounter (Signed)
What genetic test do you want to order? Pt advised she will have to call her insurance to see if it's covered, so she will need the exact name. Routing.

## 2017-02-03 NOTE — Telephone Encounter (Signed)
It is the new kit called MyRisk by [employer].

## 2017-02-04 NOTE — Telephone Encounter (Signed)
Pt advised. She will contact insurance.

## 2017-05-14 ENCOUNTER — Telehealth: Payer: Self-pay | Admitting: Family Medicine

## 2017-05-14 DIAGNOSIS — Z6834 Body mass index (BMI) 34.0-34.9, adult: Secondary | ICD-10-CM

## 2017-05-14 DIAGNOSIS — E6609 Other obesity due to excess calories: Secondary | ICD-10-CM

## 2017-05-14 NOTE — Telephone Encounter (Signed)
I would like to place a referral [facility] [place]. They will schedule you to attends a seminar and then will schedule you with a provdier. If she is OK with that please place referral.

## 2017-05-14 NOTE — Telephone Encounter (Signed)
Pt called. She wants to move forward with Bariatric surgery.

## 2017-05-15 ENCOUNTER — Encounter: Payer: Self-pay | Admitting: Family Medicine

## 2017-05-15 ENCOUNTER — Ambulatory Visit: Payer: BLUE CROSS/BLUE SHIELD | Admitting: Family Medicine

## 2017-05-15 VITALS — BP 126/79 | HR 101 | Ht 65.98 in | Wt 296.0 lb

## 2017-05-15 DIAGNOSIS — G8929 Other chronic pain: Secondary | ICD-10-CM | POA: Diagnosis not present

## 2017-05-15 DIAGNOSIS — M25562 Pain in left knee: Secondary | ICD-10-CM | POA: Diagnosis not present

## 2017-05-15 MED ORDER — DICLOFENAC SODIUM 1 % TD GEL: 4.0000 g | Freq: Four times a day (QID) | TRANSDERMAL | 11 refills | Status: DC

## 2017-05-15 NOTE — Telephone Encounter (Signed)
Dr Judie PetitM, [name] is wanting a plan put together for weight loss but not to have the surgery(does not meet the criteria), my error. She wants to be referred to Dr Cathey EndowBowen because she said you recommended Dr Cathey EndowBowen to her but if you prefer [facility] over Dr Cathey EndowBowen she trusts your judgement.

## 2017-05-15 NOTE — Telephone Encounter (Signed)
OK.  Please place referral to Dr. Ovidio KinBowen's office for bariatric consult. Thank you.

## 2017-05-15 NOTE — Telephone Encounter (Signed)
Routing this message to Provider for review.

## 2017-05-15 NOTE — Patient Instructions (Signed)
Thank you for coming in today. Add the diclofenac gel 4x daily.  Continue exercise bike.  Add extension and flexion machine.  Follow up again with ortho.

## 2017-05-15 NOTE — Progress Notes (Signed)
[name] is a 42 y.o. female who presents to [facility] today for left knee instability.  [name] suffered an ACL and medial meniscus tear last summer.  This eventually was diagnosed after some delay in her care via [employer].  She ultimately was diagnosed with an MRI and then was referred to orthopedic surgery where she had debridement of meniscus tear.  After lengthy discussion a joint decision was made to not reconstruct the torn ACL.  In the interim she has had significant improvement in symptoms however she notes continued knee instability and pain.  She is had extensive physical therapy.  She notes that she is somewhat disabled by her symptoms.  She notes she has difficulty walking on unstable terrain as she feels her knee shifting and becoming painful.  She continues to do an exercise bike but notes that because her activity level has decreased she has gained weight.  She takes meloxicam for pain which does help.   Past Medical History:  Diagnosis Date  . ACL (anterior cruciate ligament) tear    MCL. LCL Left knee  . ADHD (attention deficit hyperactivity disorder)    husband  . Anemia   . Chromosomal abnormality   . Congenital abnormality   . HSV (herpes simplex virus) anogenital infection   . Kidney stone on left side   . Miscarriage    x 8  . Plantar fasciitis    Past Surgical History:  Procedure Laterality Date  . arm surgery for fracture    . DILATION AND CURETTAGE OF UTERUS  1998  . KNEE SURGERY Left    torn menicus  . TONSILLECTOMY AND ADENOIDECTOMY    . WISDOM TOOTH EXTRACTION     Social History   Tobacco Use  . Smoking status: Former Smoker    Types: Cigarettes    Last attempt to quit: 01/27/2004    Years since quitting: 13.3  . Smokeless tobacco: Never Used  Substance Use Topics  . Alcohol use: No     ROS:  As above   Medications: Current Outpatient Medications  Medication Sig Dispense Refill  .  acetaminophen (TYLENOL 8 HOUR) 650 MG CR tablet Take 1 tablet (650 mg total) by mouth every 8 (eight) hours as needed for pain. 30 tablet 0  . albuterol (PROVENTIL HFA;VENTOLIN HFA) 108 (90 Base) MCG/ACT inhaler Inhale 2 puffs into the lungs every 4 (four) hours as needed for wheezing or shortness of breath. 1 Inhaler 0  . azithromycin (ZITHROMAX Z-PAK) 250 MG tablet Take 2 tabs today; then begin one tab once daily for 4 more days. 6 tablet 0  . benzonatate (TESSALON) 200 MG capsule Take one cap by mouth at bedtime as needed for cough.  May repeat in 4 to 6 hours 15 capsule 0  . diclofenac sodium (VOLTAREN) 1 % GEL Apply 4 g topically 4 (four) times daily. To affected joint. 100 g 11  . ibuprofen (ADVIL,MOTRIN) 600 MG tablet Take 1 tablet (600 mg total) by mouth every 6 (six) hours as needed. 30 tablet 0  . meloxicam (MOBIC) 15 MG tablet Take 15 mg by mouth daily.  2  . MULTIPLE VITAMIN PO Take 1 tablet by mouth daily.    . predniSONE (DELTASONE) 20 MG tablet Take one tab by mouth twice daily for 4 days, then one daily for 3 days. Take with food. 11 tablet 0  . valACYclovir (VALTREX) 1000 MG tablet Take 1 tablet (1,000 mg total) by mouth  daily. X 5 days 20 tablet PRN   No current facility-administered medications for this visit.    Allergies  Allergen Reactions  . Nickel      Exam:  BP 126/79   Pulse (!) 101   Ht 5' 5.98" (1.676 m)   Wt 296 lb (134.3 kg)   SpO2 98%   BMI 47.80 kg/m  General: Well Developed, well nourished, and in no acute distress.  Neuro/Psych: Alert and oriented x3, extra-ocular muscles intact, able to move all 4 extremities, sensation grossly intact. Skin: Warm and dry, no rashes noted.  Respiratory: Not using accessory muscles, speaking in full sentences, trachea midline.  Cardiovascular: Pulses palpable, no extremity edema. Abdomen: Does not appear distended. MSK: Left knee no effusion well-appearing portal scars Nontender normal range of motion. Laxity to  anterior drawer testing.  Otherwise ligament exam testing is stable. Negative McMurray's test. Intact flexion and extension strength.   EXAM: MRI OF THE LEFT KNEE WITHOUT CONTRAST  TECHNIQUE: Multiplanar, multisequence MR imaging of the knee was performed. No intravenous contrast was administered.  COMPARISON:  None.  FINDINGS: MENISCI  Medial meniscus: Complex tear of the posterior horn-body junction of the medial meniscus.  Lateral meniscus:  Intact.  LIGAMENTS  Cruciates:  Complete ACL tear.  Intact PCL.  Collaterals: Medial collateral ligament is intact. Lateral collateral ligament complex is intact.  CARTILAGE  Patellofemoral:  No chondral defect.  Medial: Mild partial-thickness cartilage loss of the medial femorotibial compartment.  Lateral:  No chondral defect.  Joint: Small joint effusion. Normal Hoffa's fat. No plical thickening.  Popliteal Fossa:  No Baker cyst.  Intact popliteus tendon.  Extensor Mechanism: Intact quadriceps tendon and patellar tendon. Intact medial and lateral patellar retinaculum. Intact MPFL.  Bones: Mild marrow edema in the anterolateral femoral condyles and posterolateral tibial plateau. Mild marrow edema in the posteromedial tibial plateau. No acute fracture or dislocation.  Other: No fluid collection or hematoma.  IMPRESSION: 1. Complete ACL tear. 2. Complex tear of the posterior horn-body junction of the medial meniscus. 3. Mild osseous contusions of the anterolateral femoral condyle, posterolateral tibial plateau and posteromedial tibial plateau.   Electronically Signed   By: Elige Ko   On: 10/07/2016 14:51  I personally (independently) visualized and performed the interpretation of the images attached in this note.   Assessment and Plan: 42 y.o. female with continued left knee pain and instability.  She had excellent orthopedic care but at this point does not have an ACL reconstruction.   She continues to have bothersome symptoms.  I am not confident that reconstructing her ACL will resolve her pain.  However she does continue to have instability which may improve with ACL reconstruction.  I think is reasonable to her for her to follow back up with Dr. Everardo Pacific at [facility] to discuss the possibility of an ACL reconstruction.. In the meantime I think it is reasonable to treat her symptoms pragmatically with weight loss quad strengthening and diclofenac gel. I prescribed diclofenac gel.  Continue exercise bike.  Discussed knee extension machine exercises. Recheck with me as needed. This is a new problem for me with uncertain prognosis.   No orders of the defined types were placed in this encounter.  Meds ordered this encounter  Medications  . diclofenac sodium (VOLTAREN) 1 % GEL    Sig: Apply 4 g topically 4 (four) times daily. To affected joint.    Dispense:  100 g    Refill:  11    Knee  DJD failed meloxicam and and ibuprofen and naproxen    Discussed warning signs or symptoms. Please see discharge instructions. Patient expresses understanding.   CC: Dr Everardo PacificVarkey

## 2017-05-16 NOTE — Telephone Encounter (Signed)
Referral placed.

## 2017-05-17 DIAGNOSIS — B373 Candidiasis of vulva and vagina: Secondary | ICD-10-CM | POA: Diagnosis not present

## 2017-05-17 DIAGNOSIS — J029 Acute pharyngitis, unspecified: Secondary | ICD-10-CM | POA: Diagnosis not present

## 2017-06-18 DIAGNOSIS — M1990 Unspecified osteoarthritis, unspecified site: Secondary | ICD-10-CM | POA: Insufficient documentation

## 2017-06-18 DIAGNOSIS — G8929 Other chronic pain: Secondary | ICD-10-CM | POA: Insufficient documentation

## 2017-06-18 DIAGNOSIS — M79673 Pain in unspecified foot: Secondary | ICD-10-CM

## 2017-06-18 DIAGNOSIS — Z6841 Body Mass Index (BMI) 40.0 and over, adult: Secondary | ICD-10-CM | POA: Diagnosis not present

## 2017-06-18 DIAGNOSIS — M7741 Metatarsalgia, right foot: Secondary | ICD-10-CM | POA: Insufficient documentation

## 2017-07-07 DIAGNOSIS — Z6841 Body Mass Index (BMI) 40.0 and over, adult: Secondary | ICD-10-CM | POA: Diagnosis not present

## 2017-07-07 DIAGNOSIS — Z7189 Other specified counseling: Secondary | ICD-10-CM | POA: Diagnosis not present

## 2017-07-07 DIAGNOSIS — F54 Psychological and behavioral factors associated with disorders or diseases classified elsewhere: Secondary | ICD-10-CM | POA: Diagnosis not present

## 2017-07-08 DIAGNOSIS — Z7189 Other specified counseling: Secondary | ICD-10-CM | POA: Insufficient documentation

## 2017-07-08 DIAGNOSIS — F54 Psychological and behavioral factors associated with disorders or diseases classified elsewhere: Secondary | ICD-10-CM

## 2017-07-19 ENCOUNTER — Encounter: Payer: Self-pay | Admitting: Emergency Medicine

## 2017-07-19 ENCOUNTER — Emergency Department
Admission: EM | Admit: 2017-07-19 | Discharge: 2017-07-19 | Disposition: A | Discharge status: Home / Self Care | Payer: BLUE CROSS/BLUE SHIELD | Source: Home / Self Care

## 2017-07-19 DIAGNOSIS — J029 Acute pharyngitis, unspecified: Secondary | ICD-10-CM

## 2017-07-19 DIAGNOSIS — K1379 Other lesions of oral mucosa: Secondary | ICD-10-CM

## 2017-07-19 LAB — POCT RAPID STREP A (OFFICE): RAPID STREP A SCREEN: NEGATIVE

## 2017-07-19 MED ORDER — MAGIC MOUTHWASH W/LIDOCAINE: 10.0000 mL | ORAL | 0 refills | Status: DC | PRN

## 2017-07-19 MED ORDER — BIOTENE DRY MOUTH MT LIQD
15.0000 mL | OROMUCOSAL | 0 refills | Status: DC | PRN
Start: 2017-07-19 — End: 2017-11-05

## 2017-07-19 MED ORDER — VALACYCLOVIR HCL 1 G PO TABS: 2000.0000 mg | ORAL_TABLET | Freq: Two times a day (BID) | ORAL | 0 refills | Status: DC

## 2017-07-19 NOTE — ED Provider Notes (Signed)
[facility]    CSN: [id number] Arrival date & time: 07/19/17  1344     History   Chief Complaint Chief Complaint  Patient presents with  . Mouth Lesions    HPI [name] is a 42 y.o. female.  Patient complaining today of soreness of throat and oral lesions which developed yesterday. Reports associated odor with breath and oral discomfort. Concern for strep as 7 of her children have tested positive within he last 3 months and she currently has two children taking antibiotics to treat strep. Describes oral discomfort as occurring on the left side of her mouth and pain feels like "a piece of corn sticking into the side of my mouth". Currently prescribed  Phentermine-topiramate for weight loss and has noticed dryness of mouth and alterations in taste since starting medication recently. Reports that she constantly drinking water. She also has a history if HSV, reports no prior cold sores or oral outbreaks previously. Past Medical History:  Diagnosis Date  . ACL (anterior cruciate ligament) tear    MCL. LCL Left knee  . ADHD (attention deficit hyperactivity disorder)    husband  . Anemia   . Chromosomal abnormality   . Congenital abnormality   . HSV (herpes simplex virus) anogenital infection   . Kidney stone on left side   . Miscarriage    x 8  . Plantar fasciitis     Patient Active Problem List   Diagnosis Date Noted  . Family history of colon cancer 12/12/2015  . Plantar fasciitis, bilateral 08/01/2015  . Short interval between pregnancies affecting pregnancy, antepartum 10/01/2013  . Ureteral calculus, left 04/12/2011  . 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 Obesity 10/01/2010  . HSV 10/03/2006  . ATTENTION DEFICIT, W/HYPERACTIVITY 10/29/2005  . Lupus anticoagulant positive 10/29/2005    Past Surgical History:  Procedure Laterality Date  . arm surgery for fracture    . DILATION AND CURETTAGE OF UTERUS  1998  . KNEE SURGERY Left    torn menicus  .  TONSILLECTOMY AND ADENOIDECTOMY    . WISDOM TOOTH EXTRACTION      OB History    Gravida  17   Para  7   Term  6   Preterm  1   AB  9   Living  7     SAB  8   TAB  1   Ectopic  0   Multiple  0   Live Births  6            Home Medications    Prior to Admission medications   Medication Sig Start Date End Date Taking? Authorizing Provider  diphenhydrAMINE HCl (BENADRYL PO) Take by mouth.   Yes [provider]  Phentermine-Topiramate (QSYMIA PO) Take by mouth.   Yes [provider]  ibuprofen (ADVIL,MOTRIN) 600 MG tablet Take 1 tablet (600 mg total) by mouth every 6 (six) hours as needed. 01/26/17   Derwood KaplanNanavati, Ankit, MD    Family History Family History  Problem Relation Age of Onset  . Autism spectrum disorder Daughter   . Other Daughter        Pancreatic insufficiency.   . Colon cancer Mother [id number]       died at age 40  . Colon polyps Mother   . Colon cancer Maternal Grandmother [id number]  . Colon cancer Other 89       mat great grand mother  . Lung cancer Father [id number]       smoker  .  Heart attack Maternal Grandfather   . Hypertension Maternal Grandfather   . Diabetes Paternal Grandmother        type 1  . Breast cancer Maternal Aunt 20  . Ovarian cancer Maternal Aunt   . Other Maternal Aunt        reproductive issues  . Colon cancer Maternal Uncle   . Chiari malformation Sister     Social History Social History   Tobacco Use  . Smoking status: Former Smoker    Types: Cigarettes    Last attempt to quit: 01/27/2004    Years since quitting: 13.4  . Smokeless tobacco: Never Used  Substance Use Topics  . Alcohol use: No  . Drug use: No     Allergies   Nickel   Review of Systems Review of Systems Pertinent negatives listed HPI  Physical Exam Triage Vital Signs ED Triage Vitals  Enc Vitals Group     BP 07/19/17 1404 114/73     Pulse Rate 07/19/17 1404 83     Resp --      Temp 07/19/17 1404 98.6 F (37 C)     Temp Source 07/19/17  1404 Oral     SpO2 07/19/17 1404 99 %     Weight 07/19/17 1405 281 lb (127.5 kg)     Height 07/19/17 1405 5' 5.5" (1.664 m)     Head Circumference --      Peak Flow --      Pain Score 07/19/17 1405 0     Pain Loc --      Pain Edu? --      Excl. in GC? --    No data found.  Updated Vital Signs BP 114/73 (BP Location: Right Arm)   Pulse 83   Temp 98.6 F (37 C) (Oral)   Ht 5' 5.5" (1.664 m)   Wt 281 lb (127.5 kg)   LMP 06/19/2017 (Exact Date)   SpO2 99%   BMI 46.05 kg/m   Visual Acuity Right Eye Distance:   Left Eye Distance:   Bilateral Distance:    Right Eye Near:   Left Eye Near:    Bilateral Near:     Physical Exam  Constitutional: She is oriented to person, place, and time. She appears well-developed and well-nourished.  HENT:  Mouth/Throat: Uvula is midline. Mucous membranes are dry. Oral lesions present. No uvula swelling.  Cardiovascular: Normal rate and regular rhythm.  Pulmonary/Chest: Effort normal and breath sounds normal.  Neurological: She is alert and oriented to person, place, and time.  Psychiatric: Her mood appears anxious.  Nursing note and vitals reviewed.  UC Treatments / Results  Labs (all labs ordered are listed, but only abnormal results are displayed) Labs Reviewed  STREP A DNA PROBE  POCT RAPID STREP A (OFFICE)    EKG None  Radiology No results found.  Procedures Procedures (including critical care time)  Medications Ordered in UC Medications - No data to display  Initial Impression / Assessment and Plan / UC Course  I have reviewed the triage vital signs and the nursing notes. Pertinent labs & imaging results that were available during my care of the patient were reviewed by me and considered in my medical decision making (see chart for details).  Patient presents today with two small opaque oral lesions which are suspicious for herpetic lesion. She has a history of HSV without recent outbreak. Oral mucosa is severely dry  and breath has an offensive odor. Oropharynx exam unremarkable. Rapid strep negative.  Will trial a short-course of valtrex for oral lesions, biotene to reduce the effects of dry mouth, and magic mouthwash for oral and throat soreness. (see medication orders)  Final Clinical Impressions(s) / UC Diagnoses   Final diagnoses:  Other lesions of oral mucosa  Sore throat   Discharge Instructions     Encourage sucking on sugar-free candy and continue increased water intake to reduce occurrences of dry mouth.  Take all medications as directed.  Strep throat culture pending. You will be notified if results warrant treatment.   ED Prescriptions    Medication Sig Dispense Auth. Provider   magic mouthwash w/lidocaine SOLN Take 10 mLs by mouth every 2 (two) hours as needed for mouth pain. 360 mL Bing Neighbors, FNP   antiseptic oral rinse (BIOTENE) LIQD 15 mLs by Mouth Rinse route as needed for dry mouth. 473 mL Bing Neighbors, FNP   valACYclovir (VALTREX) 1000 MG tablet Take 2 tablets (2,000 mg total) by mouth every 12 (twelve) hours for 2 doses. 4 tablet Bing Neighbors, FNP     Controlled Substance Prescriptions [place] Controlled Substance Registry consulted?  Not applicable    Bing Neighbors, FNP 07/19/17 1506

## 2017-07-19 NOTE — Discharge Instructions (Addendum)
Encourage sucking on sugar-free candy and continue increased water intake to reduce occurrences of dry mouth.  Take all medications as directed.  Strep throat culture pending. You will be notified if results warrant treatment.

## 2017-07-19 NOTE — ED Triage Notes (Signed)
Patient c/o ulcer in mouth, lump under tongue, breathe smells like decay, noticed after starting Qsymia.  Spoke with her physician that started her on Qsymia and does not feel that it is the Qsymia.  Patient just doesn't feel like herself.

## 2017-07-21 ENCOUNTER — Telehealth: Payer: Self-pay | Admitting: Emergency Medicine

## 2017-07-21 LAB — STREP A DNA PROBE: GROUP A STREP PROBE: NOT DETECTED

## 2017-07-30 DIAGNOSIS — M1990 Unspecified osteoarthritis, unspecified site: Secondary | ICD-10-CM | POA: Diagnosis not present

## 2017-07-30 DIAGNOSIS — Z6841 Body Mass Index (BMI) 40.0 and over, adult: Secondary | ICD-10-CM | POA: Diagnosis not present

## 2017-07-31 DIAGNOSIS — Z7189 Other specified counseling: Secondary | ICD-10-CM | POA: Diagnosis not present

## 2017-07-31 DIAGNOSIS — F54 Psychological and behavioral factors associated with disorders or diseases classified elsewhere: Secondary | ICD-10-CM | POA: Diagnosis not present

## 2017-08-06 DIAGNOSIS — Z6841 Body Mass Index (BMI) 40.0 and over, adult: Secondary | ICD-10-CM | POA: Diagnosis not present

## 2017-08-06 DIAGNOSIS — Z713 Dietary counseling and surveillance: Secondary | ICD-10-CM | POA: Diagnosis not present

## 2017-08-18 DIAGNOSIS — F54 Psychological and behavioral factors associated with disorders or diseases classified elsewhere: Secondary | ICD-10-CM | POA: Diagnosis not present

## 2017-08-18 DIAGNOSIS — Z7189 Other specified counseling: Secondary | ICD-10-CM | POA: Diagnosis not present

## 2017-08-18 DIAGNOSIS — F329 Major depressive disorder, single episode, unspecified: Secondary | ICD-10-CM | POA: Diagnosis not present

## 2017-08-19 DIAGNOSIS — S83242D Other tear of medial meniscus, current injury, left knee, subsequent encounter: Secondary | ICD-10-CM | POA: Diagnosis not present

## 2017-09-03 DIAGNOSIS — Z6841 Body Mass Index (BMI) 40.0 and over, adult: Secondary | ICD-10-CM | POA: Diagnosis not present

## 2017-09-03 DIAGNOSIS — F419 Anxiety disorder, unspecified: Secondary | ICD-10-CM | POA: Diagnosis not present

## 2017-09-03 DIAGNOSIS — F329 Major depressive disorder, single episode, unspecified: Secondary | ICD-10-CM | POA: Diagnosis not present

## 2017-09-19 DIAGNOSIS — F54 Psychological and behavioral factors associated with disorders or diseases classified elsewhere: Secondary | ICD-10-CM | POA: Diagnosis not present

## 2017-09-19 DIAGNOSIS — F39 Unspecified mood [affective] disorder: Secondary | ICD-10-CM | POA: Diagnosis not present

## 2017-09-19 DIAGNOSIS — Z6841 Body Mass Index (BMI) 40.0 and over, adult: Secondary | ICD-10-CM | POA: Diagnosis not present

## 2017-10-08 DIAGNOSIS — Z6841 Body Mass Index (BMI) 40.0 and over, adult: Secondary | ICD-10-CM | POA: Diagnosis not present

## 2017-10-08 DIAGNOSIS — Z713 Dietary counseling and surveillance: Secondary | ICD-10-CM | POA: Diagnosis not present

## 2017-10-17 DIAGNOSIS — M25562 Pain in left knee: Secondary | ICD-10-CM | POA: Diagnosis not present

## 2017-10-23 DIAGNOSIS — Z6841 Body Mass Index (BMI) 40.0 and over, adult: Secondary | ICD-10-CM | POA: Diagnosis not present

## 2017-10-23 DIAGNOSIS — M25562 Pain in left knee: Secondary | ICD-10-CM | POA: Diagnosis not present

## 2017-10-24 ENCOUNTER — Telehealth: Payer: Self-pay

## 2017-10-24 ENCOUNTER — Ambulatory Visit: Payer: BLUE CROSS/BLUE SHIELD | Admitting: Physician Assistant

## 2017-10-24 VITALS — BP 110/66 | HR 64 | Wt 258.0 lb

## 2017-10-24 DIAGNOSIS — M542 Cervicalgia: Secondary | ICD-10-CM

## 2017-10-24 DIAGNOSIS — G44201 Tension-type headache, unspecified, intractable: Secondary | ICD-10-CM | POA: Diagnosis not present

## 2017-10-24 MED ORDER — CYCLOBENZAPRINE HCL 10 MG PO TABS: 10.0000 mg | ORAL_TABLET | Freq: Three times a day (TID) | ORAL | 0 refills | Status: DC | PRN

## 2017-10-24 MED ORDER — KETOROLAC TROMETHAMINE 60 MG/2ML IM SOLN
60.0000 mg | Freq: Once | INTRAMUSCULAR | Status: AC
  Administered 2017-10-24: 60 mg via INTRAMUSCULAR

## 2017-10-24 NOTE — Patient Instructions (Addendum)
Get a massage today.  [name], [name], [name] or any deep tissue.  Tens unit and lots of heat.  Use the diclofenac gel.  Use flexeril as needed.      Neck Exercises Neck exercises can be important for many reasons:  They can help you to improve and maintain flexibility in your neck. This can be especially important as you age.  They can help to make your neck stronger. This can make movement easier.  They can reduce or prevent neck pain.  They may help your upper back.  Ask your health care provider which neck exercises would be best for you. Exercises Neck Press Repeat this exercise 10 times. Do it first thing in the morning and right before bed or as told by your health care provider. 1. Lie on your back on a firm bed or on the floor with a pillow under your head. 2. Use your neck muscles to push your head down on the pillow and straighten your spine. 3. Hold the position as well as you can. Keep your head facing up and your chin tucked. 4. Slowly count to 5 while holding this position. 5. Relax for a few seconds. Then repeat.  Isometric Strengthening Do a full set of these exercises 2 times a day or as told by your health care provider. 1. Sit in a supportive chair and place your hand on your forehead. 2. Push forward with your head and neck while pushing back with your hand. Hold for 10 seconds. 3. Relax. Then repeat the exercise 3 times. 4. Next, do thesequence again, this time putting your hand against the back of your head. Use your head and neck to push backward against the hand pressure. 5. Finally, do the same exercise on either side of your head, pushing sideways against the pressure of your hand.  Prone Head Lifts Repeat this exercise 5 times. Do this 2 times a day or as told by your health care provider. 1. Lie face-down, resting on your elbows so that your chest and upper back are raised. 2. Start with your head facing downward, near your chest. Position your chin  either on or near your chest. 3. Slowly lift your head upward. Lift until you are looking straight ahead. Then continue lifting your head as far back as you can stretch. 4. Hold your head up for 5 seconds. Then slowly lower it to your starting position.  Supine Head Lifts Repeat this exercise 8-10 times. Do this 2 times a day or as told by your health care provider. 1. Lie on your back, bending your knees to point to the ceiling and keeping your feet flat on the floor. 2. Lift your head slowly off the floor, raising your chin toward your chest. 3. Hold for 5 seconds. 4. Relax and repeat.  Scapular Retraction Repeat this exercise 5 times. Do this 2 times a day or as told by your health care provider. 1. Stand with your arms at your sides. Look straight ahead. 2. Slowly pull both shoulders backward and downward until you feel a stretch between your shoulder blades in your upper back. 3. Hold for 10-30 seconds. 4. Relax and repeat.  Contact a health care provider if:  Your neck pain or discomfort gets much worse when you do an exercise.  Your neck pain or discomfort does not improve within 2 hours after you exercise. If you have any of these problems, stop exercising right away. Do not do the exercises again unless your  health care provider says that you can. Get help right away if:  You develop sudden, severe neck pain. If this happens, stop exercising right away. Do not do the exercises again unless your health care provider says that you can. Exercises Neck Stretch  Repeat this exercise 3-5 times. 1. Do this exercise while standing or while sitting in a chair. 2. Place your feet flat on the floor, shoulder-width apart. 3. Slowly turn your head to the right. Turn it all the way to the right so you can look over your right shoulder. Do not tilt or tip your head. 4. Hold this position for 10-30 seconds. 5. Slowly turn your head to the left, to look over your left shoulder. 6. Hold  this position for 10-30 seconds.  Neck Retraction Repeat this exercise 8-10 times. Do this 3-4 times a day or as told by your health care provider. 1. Do this exercise while standing or while sitting in a sturdy chair. 2. Look straight ahead. Do not bend your neck. 3. Use your fingers to push your chin backward. Do not bend your neck for this movement. Continue to face straight ahead. If you are doing the exercise properly, you will feel a slight sensation in your throat and a stretch at the back of your neck. 4. Hold the stretch for 1-2 seconds. Relax and repeat.  This information is not intended to replace advice given to you by your health care provider. Make sure you discuss any questions you have with your health care provider. Document Released: 12/19/2014 Document Revised: 06/15/2015 Document Reviewed: 07/18/2014 Elsevier Interactive Patient Education  [employer].

## 2017-10-24 NOTE — Telephone Encounter (Signed)
Pt advised.

## 2017-10-24 NOTE — Telephone Encounter (Signed)
Pt called- received Toradol injection today at office visit and states she did not feel any relied. She went to gym for hour work out and went home and used heating pad but still is having some pain.   Wants to know when is okay for her to go ahead and take some Tylenol.

## 2017-10-24 NOTE — Progress Notes (Signed)
   Subjective:    Patient ID: [name], female    DOB: 28-Jul-1975, 42 y.o.   MRN: [id number]  HPI  Pt is a 42 yo female who presents to the clinic with neck pain and headaches. She has ongoing issues with her left knee and AcL tear that needs to be replaced. She recently had cortisone shot in knee and her headache started soon after that. She continues to lift weights. She does a lot of upper body work out. For the last few days her headache is dull and off and on. There is pain at the base of neck and feels like her head is squeezing.   .. Active Ambulatory Problems    Diagnosis Date Noted  . HSV 10/03/2006  . ATTENTION DEFICIT, W/HYPERACTIVITY 10/29/2005  . Lupus anticoagulant positive 10/29/2005  . Obesity 10/01/2010  . 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 Ureteral calculus, left 04/12/2011  . Short interval between pregnancies affecting pregnancy, antepartum 10/01/2013  . Plantar fasciitis, bilateral 08/01/2015  . Family history of colon cancer 12/12/2015   Resolved Ambulatory Problems    Diagnosis Date Noted  . VIRAL URI 03/08/2010  . Supervision of pregnancy with grand multiparity 10/01/2010  . Recurrent pregnancy loss, antepartum 11/19/2010  . BURN, FOREARM 10/12/2010  . History of precipitous labor and deliveries, antepartum 01/29/2011  . LGA (large for gestational age) fetus 02/22/2011  . NSVD (normal spontaneous vaginal delivery) 03/15/2011  . Supervision of normal intrauterine pregnancy in multigravida 03/10/2012  . Previous child with cardiac abnormality, antepartum 03/10/2012   Past Medical History:  Diagnosis Date  . ACL (anterior cruciate ligament) tear   . ADHD (attention deficit hyperactivity disorder)   . Anemia   . Congenital abnormality   . HSV (herpes simplex virus) anogenital infection   . Kidney stone on left side   . Miscarriage   . Plantar fasciitis       Review of Systems  All other systems reviewed and are negative.      Objective:    Physical Exam  Constitutional: She appears well-developed and well-nourished.  Cardiovascular: Normal rate, regular rhythm and normal heart sounds.  Pulmonary/Chest: Effort normal.  Musculoskeletal:  NROM of neck. Tenderness just under occipital skull on both sides of cervical spine.  No tenderness over cervical spine to palpation.  Upper shoulder/back muscle tightness.   Neurological: She has normal strength. Coordination normal.  Psychiatric: She has a normal mood and affect.          Assessment & Plan:  Marland KitchenMarland KitchenLinzie was seen today for headache and pulled muscle.  Diagnoses and all orders for this visit:  Neck pain -     cyclobenzaprine (FLEXERIL) 10 MG tablet; Take 1 tablet (10 mg total) by mouth 3 (three) times daily as needed for muscle spasms. -     ketorolac (TORADOL) injection 60 mg  Acute intractable tension-type headache -     cyclobenzaprine (FLEXERIL) 10 MG tablet; Take 1 tablet (10 mg total) by mouth 3 (three) times daily as needed for muscle spasms. -     ketorolac (TORADOL) injection 60 mg  pt has a lot of neck tightness and tension that I feel like is causing tension headaches.   Rest from upper body weight lifting. Consider massage therapy. Given exercises for neck and upper back. Biofreeze and heat can be very effective. Consider tens units. Toradol given today. Muscle relaxer to use as needed. Sedation warning. Continue to take ibuprofen as needed. HO on tension headaches given.

## 2017-10-24 NOTE — Telephone Encounter (Signed)
She can take tylenol now. 1000mg . Avoid NSAIDs for 24 hours after injection.

## 2017-10-27 ENCOUNTER — Encounter: Payer: Self-pay | Admitting: Physician Assistant

## 2017-11-05 ENCOUNTER — Ambulatory Visit (INDEPENDENT_AMBULATORY_CARE_PROVIDER_SITE_OTHER): Payer: BLUE CROSS/BLUE SHIELD | Admitting: Family Medicine

## 2017-11-05 ENCOUNTER — Encounter: Payer: Self-pay | Admitting: Family Medicine

## 2017-11-05 ENCOUNTER — Ambulatory Visit (INDEPENDENT_AMBULATORY_CARE_PROVIDER_SITE_OTHER): Payer: BLUE CROSS/BLUE SHIELD

## 2017-11-05 VITALS — BP 124/62 | HR 77 | Ht 66.0 in | Wt 257.0 lb

## 2017-11-05 DIAGNOSIS — Z0000 Encounter for general adult medical examination without abnormal findings: Secondary | ICD-10-CM | POA: Diagnosis not present

## 2017-11-05 DIAGNOSIS — Z1231 Encounter for screening mammogram for malignant neoplasm of breast: Secondary | ICD-10-CM | POA: Diagnosis not present

## 2017-11-05 DIAGNOSIS — Z23 Encounter for immunization: Secondary | ICD-10-CM | POA: Diagnosis not present

## 2017-11-05 NOTE — Patient Instructions (Signed)

## 2017-11-05 NOTE — Progress Notes (Signed)
Subjective:     [name] is a 42 y.o. female and is here for a comprehensive physical exam. The patient reports no problems. Having ACL surgery in December.  Is currently being seen in the bariatric clinic but because of staffing issues she actually will be switching to Dr. Orlene Plum with [facility] which I think is fantastic.  She is forward looking forward to meeting with her and will get some labs drawn next week.  Currently on phentermine for weight loss and has been able to lose weight with the medication but just does not really care for the side effects.  Social History   Socioeconomic History  . Marital status: Married    Spouse name: [name]  . Number of children: 8  . Years of education: college  . Highest education level: Not on file  Occupational History  . Occupation: Futures trader  Social Needs  . Financial resource strain: Not on file  . Food insecurity:    Worry: Not on file    Inability: Not on file  . Transportation needs:    Medical: Not on file    Non-medical: Not on file  Tobacco Use  . Smoking status: Former Smoker    Types: Cigarettes    Last attempt to quit: 01/27/2004    Years since quitting: 13.7  . Smokeless tobacco: Never Used  Substance and Sexual Activity  . Alcohol use: No  . Drug use: No  . Sexual activity: Yes    Partners: Male  Lifestyle  . Physical activity:    Days per week: Not on file    Minutes per session: Not on file  . Stress: Not on file  Relationships  . Social connections:    Talks on phone: Not on file    Gets together: Not on file    Attends religious service: Not on file    Active member of club or organization: Not on file    Attends meetings of clubs or organizations: Not on file    Relationship status: Not on file  . Intimate partner violence:    Fear of current or ex partner: Not on file    Emotionally abused: Not on file    Physically abused: Not on file    Forced sexual activity: Not on file  Other Topics  Concern  . Not on file  Social History Narrative  . Not on file   Health Maintenance  Topic Date Due  . INFLUENZA VACCINE  10/28/2018 (Originally 08/21/2017)  . PAP SMEAR  07/03/2018  . COLONOSCOPY  02/04/2021  . TETANUS/TDAP  10/04/2026  . HIV Screening  Completed    The following portions of the patient's history were reviewed and updated as appropriate: allergies, current medications, past family history, past medical history, past social history, past surgical history and problem list.  Review of Systems A comprehensive review of systems was negative.   Objective:    BP 124/62   Pulse 77   Ht 5\' 6"  (1.676 m)   Wt 257 lb (116.6 kg)   LMP 10/15/2017 (Approximate)   SpO2 99%   BMI 41.48 kg/m  General appearance: alert, cooperative and appears stated age Head: Normocephalic, without obvious abnormality, atraumatic Eyes: conj clear, EOMi, PEERLA Ears: normal TM's and external ear canals both ears Nose: Nares normal. Septum midline. Mucosa normal. No drainage or sinus tenderness. Throat: lips, mucosa, and tongue normal; teeth and gums normal Neck: no adenopathy, no carotid bruit, no JVD, supple, symmetrical, trachea midline and  thyroid not enlarged, symmetric, no tenderness/mass/nodules Back: symmetric, no curvature. ROM normal. No CVA tenderness. Lungs: clear to auscultation bilaterally Breasts: normal appearance, no masses or tenderness Heart: regular rate and rhythm, S1, S2 normal, no murmur, click, rub or gallop Abdomen: soft, non-tender; bowel sounds normal; no masses,  no organomegaly Extremities: extremities normal, atraumatic, no cyanosis or edema Pulses: 2+ and symmetric Skin: Skin color, texture, turgor normal. No rashes or lesions Lymph nodes: Cervical, supraclavicular, and axillary nodes normal. Neurologic: Alert and oriented X 3, normal strength and tone. Normal symmetric reflexes. Normal coordination and gait    Assessment:    Healthy female exam.       Plan:     See After Visit Summary for Counseling Recommendations   Keep up a regular exercise program and make sure you are eating a healthy diet Try to eat 4 servings of dairy a day, or if you are lactose intolerant take a calcium with vitamin D daily.  Your vaccines are up to date.   BMI 41/obese-we will be following with Dr. Orlene Plum has her first appointment next week and will get labs done at that time.  We did not want to duplicate anything so she will have those forwarded to me and if we need to do anything additional then I will let her know.    Also discussed starting mammogram screening.  She will go down this morning for her first mammogram.  The chromosomal abnormality she is at risk for having B12 and folic acid deficiency.  Fully she can get these checked with Dr. Dalbert Garnet and if not will be happy to check those.

## 2017-11-06 ENCOUNTER — Encounter (INDEPENDENT_AMBULATORY_CARE_PROVIDER_SITE_OTHER): Payer: Medicaid Other

## 2017-11-07 DIAGNOSIS — Z7189 Other specified counseling: Secondary | ICD-10-CM | POA: Diagnosis not present

## 2017-11-07 DIAGNOSIS — F54 Psychological and behavioral factors associated with disorders or diseases classified elsewhere: Secondary | ICD-10-CM | POA: Diagnosis not present

## 2017-11-07 DIAGNOSIS — F39 Unspecified mood [affective] disorder: Secondary | ICD-10-CM | POA: Diagnosis not present

## 2017-11-11 ENCOUNTER — Encounter (INDEPENDENT_AMBULATORY_CARE_PROVIDER_SITE_OTHER): Payer: Self-pay | Admitting: Family Medicine

## 2017-11-11 ENCOUNTER — Ambulatory Visit (INDEPENDENT_AMBULATORY_CARE_PROVIDER_SITE_OTHER): Payer: BLUE CROSS/BLUE SHIELD | Admitting: Family Medicine

## 2017-11-11 VITALS — BP 113/72 | HR 77 | Temp 97.9°F | Ht 66.0 in | Wt 250.0 lb

## 2017-11-11 DIAGNOSIS — Z9189 Other specified personal risk factors, not elsewhere classified: Secondary | ICD-10-CM

## 2017-11-11 DIAGNOSIS — R739 Hyperglycemia, unspecified: Secondary | ICD-10-CM | POA: Diagnosis not present

## 2017-11-11 DIAGNOSIS — R0602 Shortness of breath: Secondary | ICD-10-CM

## 2017-11-11 DIAGNOSIS — Z6841 Body Mass Index (BMI) 40.0 and over, adult: Secondary | ICD-10-CM

## 2017-11-11 DIAGNOSIS — Z1331 Encounter for screening for depression: Secondary | ICD-10-CM | POA: Diagnosis not present

## 2017-11-11 DIAGNOSIS — R5383 Other fatigue: Secondary | ICD-10-CM | POA: Diagnosis not present

## 2017-11-11 DIAGNOSIS — Z0289 Encounter for other administrative examinations: Secondary | ICD-10-CM

## 2017-11-12 ENCOUNTER — Encounter (INDEPENDENT_AMBULATORY_CARE_PROVIDER_SITE_OTHER): Payer: Self-pay | Admitting: Family Medicine

## 2017-11-12 LAB — CBC WITH DIFFERENTIAL
BASOS ABS: 0.1 10*3/uL (ref 0.0–0.2)
Basos: 1 %
EOS (ABSOLUTE): 0.2 10*3/uL (ref 0.0–0.4)
Eos: 2 %
Hematocrit: 36.8 % (ref 34.0–46.6)
Hemoglobin: 11.9 g/dL (ref 11.1–15.9)
Immature Grans (Abs): 0 10*3/uL (ref 0.0–0.1)
Immature Granulocytes: 0 %
LYMPHS: 31 %
Lymphocytes Absolute: 2.8 10*3/uL (ref 0.7–3.1)
MCH: 26.5 pg — ABNORMAL LOW (ref 26.6–33.0)
MCHC: 32.3 g/dL (ref 31.5–35.7)
MCV: 82 fL (ref 79–97)
MONOS ABS: 0.5 10*3/uL (ref 0.1–0.9)
Monocytes: 5 %
NEUTROS ABS: 5.5 10*3/uL (ref 1.4–7.0)
NEUTROS PCT: 61 %
RBC: 4.49 x10E6/uL (ref 3.77–5.28)
RDW: 14.5 % (ref 12.3–15.4)
WBC: 8.9 10*3/uL (ref 3.4–10.8)

## 2017-11-12 LAB — LIPID PANEL WITH LDL/HDL RATIO
Cholesterol, Total: 163 mg/dL (ref 100–199)
HDL: 41 mg/dL (ref >39)
LDL CALC: 96 mg/dL (ref 0–99)
LDL/HDL RATIO: 2.3 ratio (ref 0.0–3.2)
Triglycerides: 128 mg/dL (ref 0–149)
VLDL CHOLESTEROL CAL: 26 mg/dL (ref 5–40)

## 2017-11-12 LAB — HEMOGLOBIN A1C
ESTIMATED AVERAGE GLUCOSE: 105 mg/dL
HEMOGLOBIN A1C: 5.3 % (ref 4.8–5.6)

## 2017-11-12 LAB — COMPREHENSIVE METABOLIC PANEL
ALK PHOS: 71 IU/L (ref 39–117)
ALT: 10 IU/L (ref 0–32)
AST: 12 IU/L (ref 0–40)
Albumin/Globulin Ratio: 1.3 (ref 1.2–2.2)
Albumin: 4.3 g/dL (ref 3.5–5.5)
BILIRUBIN TOTAL: 0.3 mg/dL (ref 0.0–1.2)
BUN/Creatinine Ratio: 14 (ref 9–23)
BUN: 11 mg/dL (ref 6–24)
CHLORIDE: 102 mmol/L (ref 96–106)
CO2: 23 mmol/L (ref 20–29)
Calcium: 9.5 mg/dL (ref 8.7–10.2)
Creatinine, Ser: 0.76 mg/dL (ref 0.57–1.00)
GFR calc Af Amer: 113 mL/min/{1.73_m2} (ref >59)
GFR calc non Af Amer: 98 mL/min/{1.73_m2} (ref >59)
GLUCOSE: 84 mg/dL (ref 65–99)
Globulin, Total: 3.2 g/dL (ref 1.5–4.5)
Potassium: 4.1 mmol/L (ref 3.5–5.2)
Sodium: 140 mmol/L (ref 134–144)
Total Protein: 7.5 g/dL (ref 6.0–8.5)

## 2017-11-12 LAB — FOLATE: FOLATE: 19.6 ng/mL (ref >3.0)

## 2017-11-12 LAB — VITAMIN D 25 HYDROXY (VIT D DEFICIENCY, FRACTURES): Vit D, 25-Hydroxy: 38.8 ng/mL (ref 30.0–100.0)

## 2017-11-12 LAB — TSH: TSH: 1.3 u[IU]/mL (ref 0.450–4.500)

## 2017-11-12 LAB — T3: T3, Total: 139 ng/dL (ref 71–180)

## 2017-11-12 LAB — T4, FREE: Free T4: 1.31 ng/dL (ref 0.82–1.77)

## 2017-11-12 LAB — VITAMIN B12: Vitamin B-12: 490 pg/mL (ref 232–1245)

## 2017-11-12 LAB — INSULIN, RANDOM: INSULIN: 8.9 u[IU]/mL (ref 2.6–24.9)

## 2017-11-13 NOTE — Progress Notes (Signed)
Office: [phone]  /  Fax: [phone]   Dear Dr. Linford Arnold,   Thank you for referring [name] to our clinic. The following note includes my evaluation and treatment recommendations.  HPI:   Chief Complaint: OBESITY    [name] has been referred by Barbarann Ehlers. Linford Arnold, MD for consultation regarding her obesity and obesity related comorbidities.    [name] (MR# [id number]) is a 42 y.o. female who presents on 11/13/2017 for obesity evaluation and treatment. Current BMI is Body mass index is 40.35 kg/m.Marland Kitchen [name] has been struggling with her weight for many years and has been unsuccessful in either losing weight, maintaining weight loss, or reaching her healthy weight goal.     [name] attended our information session and states she is currently in the action stage of change and ready to dedicate time achieving and maintaining a healthier weight. [name] is interested in becoming our patient and working on intensive lifestyle modifications including (but not limited to) diet, exercise and weight loss.    [name] states her family eats meals together she thinks her family will eat healthier with her she struggles with family and or coworkers weight loss sabotage her desired weight loss is 87 lbs she has been heavy most of  her life she started gaining weight at age 57 her heaviest weight ever was 305 lbs. she has significant food cravings issues  she snacks frequently in the evenings she frequently eats larger portions than normal  she has binge eating behaviors   Fatigue [name] feels her energy is lower than it should be. This has worsened with weight gain and has not worsened recently. [name] admits to daytime somnolence and admits to waking up still tired. Patient is at risk for obstructive sleep apnea. [name] has a history of symptoms of daytime fatigue and morning fatigue. Patient generally gets 5 or 6 hours of sleep per night, and states they generally  have restless sleep. Snoring is not present. Apneic episodes are not present. Epworth Sleepiness Score is 1  EKG was ordered today which shows normal sinus rhythm with low voltage.  Dyspnea on exertion [name] notes increasing shortness of breath with exercising and seems to be worsening over time with weight gain. She notes getting out of breath sooner with activity than she used to. This has not gotten worse recently. EKG was ordered today which shows normal sinus rhythm with low voltage. [name] denies orthopnea.  Hyperglycemia [name] has a history of some elevated blood glucose in January 2019 without a diagnosis of diabetes. She denies polyphagia.  At risk for diabetes [name] is at higher than average risk for developing diabetes due to her obesity and hyperglycemia. She currently denies polyuria or polydipsia.  Depression Screen [name] (modified PHQ-9) score was  Depression screen PHQ 2/9 11/11/2017  Decreased Interest 1  Down, Depressed, Hopeless 1  PHQ - 2 Score 2  Altered sleeping 1  Tired, decreased energy 2  Change in appetite 1  Feeling bad or failure about yourself  0  Trouble concentrating 0  Moving slowly or fidgety/restless 0  Suicidal thoughts 0  PHQ-9 Score 6  Difficult doing work/chores Not difficult at all    ALLERGIES: Allergies  Allergen Reactions  . Dairy Aid [Lactase] Other (See Comments)    Excess gas, bloating, diarrhea  . Nickel     MEDICATIONS: Current Outpatient Medications on File Prior to Visit  Medication Sig Dispense Refill  . acetaminophen (TYLENOL 8 HOUR) 650 MG  CR tablet Take 1 tablet by mouth as needed.    . diclofenac sodium (VOLTAREN) 1 % GEL Apply topically as needed.    . diphenhydrAMINE HCl (BENADRYL PO) Take by mouth.    . Lactobacillus (PROBIOTIC ACIDOPHILUS) TABS Take by mouth daily as needed.    Marland Kitchen LOMAIRA 8 MG TABS Take 1 tablet by mouth 2 (two) times daily.  1  . magnesium 30 MG tablet Take 30 mg by mouth 2  (two) times daily.    . meloxicam (MOBIC) 15 MG tablet Take 15 mg by mouth daily.    . Multiple Vitamin (MULTIVITAMIN) tablet Take 1 tablet by mouth daily.    Marland Kitchen VITAMIN D, ERGOCALCIFEROL, PO Take 5,000 Units by mouth daily.     No current facility-administered medications on file prior to visit.     PAST MEDICAL HISTORY: Past Medical History:  Diagnosis Date  . ACL (anterior cruciate ligament) tear    MCL. LCL Left knee  . ADHD (attention deficit hyperactivity disorder)    husband  . Anemia   . B12 deficiency   . Blood clot in vein   . Chromosomal abnormality   . Congenital abnormality   . Constipation   . Headache   . HSV (herpes simplex virus) anogenital infection   . Joint pain   . Kidney stone on left side   . Lactose intolerance   . Miscarriage    x 8  . 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 Pinched nerve    neck/shoulder  . Plantar fasciitis   . Post partum depression     PAST SURGICAL HISTORY: Past Surgical History:  Procedure Laterality Date  . arm surgery for fracture    . DILATION AND CURETTAGE OF UTERUS  1998  . KNEE SURGERY Left    torn menicus  . TONSILLECTOMY AND ADENOIDECTOMY    . WISDOM TOOTH EXTRACTION      SOCIAL HISTORY: Social History   Tobacco Use  . Smoking status: Former Smoker    Types: Cigarettes    Last attempt to quit: 01/27/2004    Years since quitting: 13.8  . Smokeless tobacco: Never Used  Substance Use Topics  . Alcohol use: No  . Drug use: No    FAMILY HISTORY: Family History  Adopted: Yes  Problem Relation Age of Onset  . Autism spectrum disorder Daughter   . Other Daughter        Pancreatic insufficiency.   . Colon cancer Mother 44       died at age 62  . Colon polyps Mother   . Colon cancer Maternal Grandmother 67  . Colon cancer Other 89       mat great grand mother  . Lung cancer Father 43       smoker  . Heart attack Maternal Grandfather   . Hypertension Maternal  Grandfather   . Diabetes Paternal Grandmother        type 1  . Breast cancer Maternal Aunt 20  . Ovarian cancer Maternal Aunt   . Other Maternal Aunt        reproductive issues  . Colon cancer Maternal Uncle   . Chiari malformation Sister     ROS: Review of Systems  Constitutional: Positive for malaise/fatigue.  HENT:       + Dry Mouth  Respiratory: Positive for shortness of breath (on exertion).   Cardiovascular: Negative for orthopnea.  Gastrointestinal: Positive for constipation.  Genitourinary: Negative for frequency.  Musculoskeletal: Positive for neck pain.       + Neck Stiffness + Muscle Stiffness + Red or Swollen Joints  Neurological: Positive for headaches.  Endo/Heme/Allergies: Negative for polydipsia. Bruises/bleeds easily.       Negative for polyphagia  Psychiatric/Behavioral: The patient has insomnia.     PHYSICAL EXAM: Blood pressure 113/72, pulse 77, temperature 97.9 F (36.6 C), temperature source Oral, height 5\' 6"  (1.676 m), weight 250 lb (113.4 kg), last menstrual period 10/15/2017, SpO2 99 %, unknown if currently breastfeeding. Body mass index is 40.35 kg/m. Physical Exam  Constitutional: She is oriented to person, place, and time. She appears well-developed and well-nourished.  HENT:  Head: Normocephalic and atraumatic.  Nose: Nose normal.  Eyes: EOM are normal. No scleral icterus.  Neck: Normal range of motion. Neck supple. No thyromegaly present.  Cardiovascular: Normal rate and regular rhythm.  Murmur (heard best at aortic area non radiating) heard.  Systolic murmur is present with a grade of 2/6. Pulmonary/Chest: Effort normal. No respiratory distress.  Abdominal: Soft. There is no tenderness.  + Obesity  Musculoskeletal: Normal range of motion.  Range of Motion normal in all 4 extremities  Neurological: She is alert and oriented to person, place, and time. Coordination normal.  Skin: Skin is warm and dry.  Psychiatric: She has a normal  Shannon and affect. Her behavior is normal.  Vitals reviewed.   RECENT LABS AND TESTS: BMET    Component Value Date/Time   NA 140 11/11/2017 1249   K 4.1 11/11/2017 1249   CL 102 11/11/2017 1249   CO2 23 11/11/2017 1249   GLUCOSE 84 11/11/2017 1249   GLUCOSE 114 (H) 01/26/2017 1055   BUN 11 11/11/2017 1249   CREATININE 0.76 11/11/2017 1249   CREATININE 0.71 10/03/2016 1152   CALCIUM 9.5 11/11/2017 1249   GFRNONAA 98 11/11/2017 1249   GFRNONAA 107 10/03/2016 1152   GFRAA 113 11/11/2017 1249   GFRAA 124 10/03/2016 1152   Lab Results  Component Value Date   HGBA1C 5.3 11/11/2017   Lab Results  Component Value Date   INSULIN 8.9 11/11/2017   CBC    Component Value Date/Time   WBC 8.9 11/11/2017 1249   WBC 9.2 01/26/2017 1055   RBC 4.49 11/11/2017 1249   RBC 4.66 01/26/2017 1055   HGB 11.9 11/11/2017 1249   HCT 36.8 11/11/2017 1249   PLT 408 (H) 01/26/2017 1055   MCV 82 11/11/2017 1249   MCH 26.5 (L) 11/11/2017 1249   MCH 26.6 01/26/2017 1055   MCHC 32.3 11/11/2017 1249   MCHC 32.5 01/26/2017 1055   RDW 14.5 11/11/2017 1249   LYMPHSABS 2.8 11/11/2017 1249   MONOABS 0.8 01/26/2017 1055   EOSABS 0.2 11/11/2017 1249   BASOSABS 0.1 11/11/2017 1249   Iron/TIBC/Ferritin/ %Sat    Component Value Date/Time   FERRITIN 24 12/08/2015 1418   Lipid Panel     Component Value Date/Time   CHOL 163 11/11/2017 1249   TRIG 128 11/11/2017 1249   HDL 41 11/11/2017 1249   LDLCALC 96 11/11/2017 1249   Hepatic Function Panel     Component Value Date/Time   PROT 7.5 11/11/2017 1249   ALBUMIN 4.3 11/11/2017 1249   AST 12 11/11/2017 1249   ALT 10 11/11/2017 1249   ALKPHOS 71 11/11/2017 1249   BILITOT 0.3 11/11/2017 1249      Component Value Date/Time   TSH 1.300 11/11/2017 1249   TSH 1.13 10/03/2016 1152   TSH 1.12 12/08/2015 1418  ECG  shows NSR with a rate of 69 BPM INDIRECT CALORIMETER done today shows a VO2 of 247 and a REE of 1716.  Her calculated basal  metabolic rate is 1610 thus her basal metabolic rate is worse than expected.    ASSESSMENT AND PLAN: Other fatigue - Plan: EKG 12-Lead, Vitamin B12, CBC With Differential, Folate, T3, T4, free, TSH, VITAMIN D 25 Hydroxy (Vit-D Deficiency, Fractures)  Shortness of breath on exertion - Plan: Lipid Panel With LDL/HDL Ratio, ECHOCARDIOGRAM COMPLETE  Hyperglycemia - Plan: Comprehensive metabolic panel, Hemoglobin A1c, Insulin, random  Depression screening  At risk for diabetes mellitus  Class 3 severe obesity with serious comorbidity and body mass index (BMI) of 40.0 to 44.9 in adult, unspecified obesity type (HCC)  PLAN: Fatigue Louvinia was informed that her fatigue may be related to obesity, depression or many other causes. Labs will be ordered, and in the meanwhile Zakari has agreed to work on diet, exercise and weight loss to help with fatigue. Proper sleep hygiene was discussed including the need for 7-8 hours of quality sleep each night. A sleep study was not ordered based on symptoms and Epworth score. We will order indirect calorimetry, EKG and echocardiogram [facility] Auth# [id number] from 10/23 to 11/21) today.  Dyspnea on exertion Ines's shortness of breath appears to be obesity related and exercise induced. She has agreed to work on weight loss and gradually increase exercise to treat her exercise induced shortness of breath. If Eula follows our instructions and loses weight without improvement of her shortness of breath, we will plan to refer to pulmonology. Franshesca We will order indirect calorimetry, EKG, echocardiogram [facility] Auth# [id number] from 10/23 to 11/21) and labs today. We will monitor this condition regularly. Eisa agrees to this plan.  Hyperglycemia Fasting labs will be obtained and results with be discussed with Lazette in 2 weeks at her follow up visit. In the meanwhile Zenobia was started on a lower simple carbohydrate diet  and will work on weight loss efforts.  Diabetes risk counseling Marleena was given extended (15 minutes) diabetes prevention counseling today. She is 42 y.o. female and has risk factors for diabetes including obesity and hyperglycemia. We discussed intensive lifestyle modifications today with an emphasis on weight loss as well as increasing exercise and decreasing simple carbohydrates in her diet.  Depression Screen Brookelin had a mildly positive depression screening. Depression is commonly associated with obesity and often results in emotional eating behaviors. We will monitor this closely and work on CBT to help improve the non-hunger eating patterns. Referral to Psychology may be required if no improvement is seen as she continues in our clinic.  Obesity Jamyria is currently in the action stage of change and her goal is to continue with weight loss efforts. I recommend Mardee begin the structured treatment plan as follows:  She has agreed to follow the Category 3 plan Jozey has been instructed to eventually work up to a goal of 150 minutes of combined cardio and strengthening exercise per week for weight loss and overall health benefits. We discussed the following Behavioral Modification Strategies today: better snacking choices, planning for success, increasing lean protein intake, increasing vegetables and work on meal planning and easy cooking plans  Dalaya will continue to take [facility] as prescribed.   She was informed of the importance of frequent follow up visits to maximize her success with intensive lifestyle modifications for her multiple health conditions. She was informed we would discuss her lab results  at her next visit unless there is a critical issue that needs to be addressed sooner. Howard agreed to keep her next visit at the agreed upon time to discuss these results.    OBESITY BEHAVIORAL INTERVENTION VISIT  Today's visit was # 1   Starting weight: 250 lbs Starting  date: 11/11/17 Today's weight : 250 lbs  Today's date: 11/11/2017 Total lbs lost to date: 0   ASK: We discussed the diagnosis of obesity with Duwaine Maxin today and Herbert Seta agreed to give [place] permission to discuss obesity behavioral modification therapy today.  ASSESS: Amelya has the diagnosis of obesity and her BMI today is 40.37 Becci is in the action stage of change   ADVISE: Nakeyia was educated on the multiple health risks of obesity as well as the benefit of weight loss to improve her health. She was advised of the need for long term treatment and the importance of lifestyle modifications to improve her current health and to decrease her risk of future health problems.  AGREE: Multiple dietary modification options and treatment options were discussed and  Liliyana agreed to follow the recommendations documented in the above note.  ARRANGE: Raechell was educated on the importance of frequent visits to treat obesity as outlined per CMS and USPSTF guidelines and agreed to schedule her next follow up appointment today.  I, [place] Crane, am acting as transcriptionist for Filbert Schilder, MD    I have reviewed the above documentation for accuracy and completeness, and I agree with the above. - Debbra Riding, MD

## 2017-11-17 ENCOUNTER — Other Ambulatory Visit: Payer: Self-pay

## 2017-11-17 ENCOUNTER — Ambulatory Visit (HOSPITAL_COMMUNITY): Payer: BLUE CROSS/BLUE SHIELD | Attending: Cardiology

## 2017-11-17 DIAGNOSIS — R0602 Shortness of breath: Secondary | ICD-10-CM | POA: Diagnosis not present

## 2017-11-18 ENCOUNTER — Other Ambulatory Visit: Payer: Self-pay

## 2017-11-18 ENCOUNTER — Emergency Department (HOSPITAL_BASED_OUTPATIENT_CLINIC_OR_DEPARTMENT_OTHER)
Admission: EM | Admit: 2017-11-18 | Discharge: 2017-11-18 | Disposition: A | Discharge status: Home / Self Care | Payer: BLUE CROSS/BLUE SHIELD | Attending: Emergency Medicine | Admitting: Emergency Medicine

## 2017-11-18 ENCOUNTER — Emergency Department (HOSPITAL_BASED_OUTPATIENT_CLINIC_OR_DEPARTMENT_OTHER): Payer: BLUE CROSS/BLUE SHIELD

## 2017-11-18 ENCOUNTER — Encounter (HOSPITAL_BASED_OUTPATIENT_CLINIC_OR_DEPARTMENT_OTHER): Payer: Self-pay | Admitting: Emergency Medicine

## 2017-11-18 DIAGNOSIS — Z87891 Personal history of nicotine dependence: Secondary | ICD-10-CM | POA: Diagnosis not present

## 2017-11-18 DIAGNOSIS — Z79899 Other long term (current) drug therapy: Secondary | ICD-10-CM | POA: Insufficient documentation

## 2017-11-18 DIAGNOSIS — F909 Attention-deficit hyperactivity disorder, unspecified type: Secondary | ICD-10-CM | POA: Insufficient documentation

## 2017-11-18 DIAGNOSIS — R079 Chest pain, unspecified: Secondary | ICD-10-CM | POA: Diagnosis not present

## 2017-11-18 DIAGNOSIS — R0789 Other chest pain: Secondary | ICD-10-CM

## 2017-11-18 DIAGNOSIS — R202 Paresthesia of skin: Secondary | ICD-10-CM | POA: Diagnosis not present

## 2017-11-18 LAB — BASIC METABOLIC PANEL
ANION GAP: 8 (ref 5–15)
BUN: 22 mg/dL — AB (ref 6–20)
CHLORIDE: 104 mmol/L (ref 98–111)
CO2: 25 mmol/L (ref 22–32)
Calcium: 9.4 mg/dL (ref 8.9–10.3)
Creatinine, Ser: 0.77 mg/dL (ref 0.44–1.00)
GFR calc Af Amer: >60 mL/min (ref >60)
Glucose, Bld: 95 mg/dL (ref 70–99)
POTASSIUM: 3.6 mmol/L (ref 3.5–5.1)
SODIUM: 137 mmol/L (ref 135–145)

## 2017-11-18 LAB — PREGNANCY, URINE: PREG TEST UR: NEGATIVE

## 2017-11-18 LAB — D-DIMER, QUANTITATIVE: D-Dimer, Quant: 0.41 ug/mL-FEU (ref 0.00–0.50)

## 2017-11-18 LAB — CBC
HCT: 38.8 % (ref 36.0–46.0)
HEMOGLOBIN: 12.3 g/dL (ref 12.0–15.0)
MCH: 26.2 pg (ref 26.0–34.0)
MCHC: 31.7 g/dL (ref 30.0–36.0)
MCV: 82.7 fL (ref 80.0–100.0)
Platelets: 412 10*3/uL — ABNORMAL HIGH (ref 150–400)
RBC: 4.69 MIL/uL (ref 3.87–5.11)
RDW: 14.7 % (ref 11.5–15.5)
WBC: 13.5 10*3/uL — AB (ref 4.0–10.5)
nRBC: 0 % (ref 0.0–0.2)

## 2017-11-18 LAB — TROPONIN I: Troponin I: <0.03 ng/mL (ref <0.03)

## 2017-11-18 NOTE — ED Triage Notes (Signed)
Pt c/o chest pressure today while driving at 3pm that lasted for 5 min. She says she broke out in a sweat and became hot. She states this has been happening for 3-4 weeks and has been referred to cardiology and had an echo done. States the pain is less now and she is having tingling to her hands and feet.

## 2017-11-18 NOTE — ED Provider Notes (Signed)
[facility] EMERGENCY DEPARTMENT Provider Note   CSN: [id number] Arrival date & time: 11/18/17  1621     History   Chief Complaint Chief Complaint  Patient presents with  . Chest Pain    HPI [name] is a 42 y.o. female.  42 year old female with past medical history below who presents with chest pain.  Patient states that she was driving around 3 PM today when she began having pins-and-needles sensation to her arms and legs which she has been getting more frequently currently.  She then began having central, nonradiating chest heaviness that was initially sharp but then turned dull, lasted approximately 5 minutes.  Came scared about the chest pain and pulled over, called her husband, and drove home to drop off the kids before coming here.  Currently she is not having any chest pain but she continues to have tingling in her hands and feet.  She denies any associated shortness of breath, nausea, vomiting, or diaphoresis.  She notes that recently she has been having episodes of feeling the blood rushing to her head when she stands up too fast.  All of these symptoms have been getting worse recently and she got a referral to cardiology from her family medicine doctor.  Recently had echo done and is awaiting clinic follow-up appointment.  She denies any fevers, cough/cold symptoms, or recent illness.  No alcohol or drug problems.  No recent travel, leg swelling, or estrogen use.  The history is provided by the patient.  Chest Pain      Past Medical History:  Diagnosis Date  . ACL (anterior cruciate ligament) tear    MCL. LCL Left knee  . ADHD (attention deficit hyperactivity disorder)    husband  . Anemia   . B12 deficiency   . Blood clot in vein   . Chromosomal abnormality   . Congenital abnormality   . Constipation   . Headache   . HSV (herpes simplex virus) anogenital infection   . Joint pain   . Kidney stone on left side   . Lactose intolerance   .  Miscarriage    x 8  . 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 Pinched nerve    neck/shoulder  . Plantar fasciitis   . Post partum depression     Patient Active Problem List   Diagnosis Date Noted  . Family history of colon cancer 12/12/2015  . Plantar fasciitis, bilateral 08/01/2015  . Short interval between pregnancies affecting pregnancy, antepartum 10/01/2013  . Ureteral calculus, left 04/12/2011  . 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 Obesity 10/01/2010  . HSV 10/03/2006  . ATTENTION DEFICIT, W/HYPERACTIVITY 10/29/2005  . Lupus anticoagulant positive 10/29/2005    Past Surgical History:  Procedure Laterality Date  . arm surgery for fracture    . DILATION AND CURETTAGE OF UTERUS  1998  . KNEE SURGERY Left    torn menicus  . TONSILLECTOMY AND ADENOIDECTOMY    . WISDOM TOOTH EXTRACTION       OB History    Gravida  18   Para  8   Term  7   Preterm  1   AB  9   Living  7     SAB  8   TAB  1   Ectopic  0   Multiple  0   Live Births  6            Home Medications    Prior to Admission  medications   Medication Sig Start Date End Date Taking? Authorizing Provider  acetaminophen (TYLENOL 8 HOUR) 650 MG CR tablet Take 1 tablet by mouth as needed. 01/26/17   [provider]  diclofenac sodium (VOLTAREN) 1 % GEL Apply topically as needed.    [provider]  diphenhydrAMINE HCl (BENADRYL PO) Take by mouth.    [provider]  Lactobacillus (PROBIOTIC ACIDOPHILUS) TABS Take by mouth daily as needed.    [provider]  LOMAIRA 8 MG TABS Take 1 tablet by mouth 2 (two) times daily. 10/23/17   [provider]  magnesium 30 MG tablet Take 30 mg by mouth 2 (two) times daily.    [provider]  meloxicam (MOBIC) 15 MG tablet Take 15 mg by mouth daily. 03/15/16   [provider]  Multiple Vitamin (MULTIVITAMIN) tablet Take 1 tablet by mouth daily.    [provider]  VITAMIN D, ERGOCALCIFEROL, PO Take 5,000 Units by mouth daily.    [provider]    Family History Family History  Adopted: Yes  Problem Relation Age of Onset  . Autism spectrum disorder Daughter   . Other Daughter        Pancreatic insufficiency.   . Colon cancer Mother 80       died at age 9  . Colon polyps Mother   . Colon cancer Maternal Grandmother 69  . Colon cancer Other 89       mat great grand mother  . Lung cancer Father 2       smoker  . Heart attack Maternal Grandfather   . Hypertension Maternal Grandfather   . Diabetes Paternal Grandmother        type 1  . Breast cancer Maternal Aunt 20  . Ovarian cancer Maternal Aunt   . Other Maternal Aunt        reproductive issues  . Colon cancer Maternal Uncle   . Chiari malformation Sister     Social History Social History   Tobacco Use  . Smoking status: Former Smoker    Types: Cigarettes    Last attempt to quit: 01/27/2004    Years since quitting: 13.8  . Smokeless tobacco: Never Used  Substance Use Topics  . Alcohol use: No  . Drug use: No     Allergies   Dairy aid [lactase] and Nickel   Review of Systems Review of Systems  Cardiovascular: Positive for chest pain.   All other systems reviewed and are negative except that which was mentioned in HPI   Physical Exam Updated Vital Signs BP 119/78   Pulse 72   Temp 98.9 F (37.2 C) (Oral)   Resp 17   Ht 5\' 6"  (1.676 m)   Wt 112.9 kg   LMP 11/04/2017   SpO2 99%   BMI 40.19 kg/m   Physical Exam  Constitutional: She is oriented to person, place, and time. She appears well-developed and well-nourished. No distress.  HENT:  Head: Normocephalic and atraumatic.  Moist mucous membranes  Eyes: Pupils are equal, round, and reactive to light. Conjunctivae are normal.  Neck: Neck supple.  Cardiovascular: Normal rate, regular rhythm and normal heart sounds.  No murmur heard. Pulmonary/Chest: Effort normal and breath sounds  normal.  Abdominal: Soft. Bowel sounds are normal. She exhibits no distension. There is no tenderness.  Musculoskeletal: She exhibits no edema.  Neurological: She is alert and oriented to person, place, and time.  Fluent speech  Skin: Skin is warm and  dry.  Psychiatric: Judgment normal.  Mildly anxious  Nursing note and vitals reviewed.    ED Treatments / Results  Labs (all labs ordered are listed, but only abnormal results are displayed) Labs Reviewed  BASIC METABOLIC PANEL - Abnormal; Notable for the following components:      Result Value   BUN 22 (*)    All other components within normal limits  CBC - Abnormal; Notable for the following components:   WBC 13.5 (*)    Platelets 412 (*)    All other components within normal limits  TROPONIN I  PREGNANCY, URINE  D-DIMER, QUANTITATIVE (NOT AT [facility])  TROPONIN I    EKG EKG Interpretation  Date/Time:  Tuesday November 18 2017 16:30:10 EDT Ventricular Rate:  78 PR Interval:  152 QRS Duration: 84 QT Interval:  368 QTC Calculation: 419 R Axis:   68 Text Interpretation:  Normal sinus rhythm with sinus arrhythmia Normal ECG No significant change since last tracing Confirmed by Frederick Peers [phone]) on 11/18/2017 7:25:48 PM   Radiology Dg Chest 2 View  Result Date: 11/18/2017 CLINICAL DATA:  Chest pain EXAM: CHEST - 2 VIEW COMPARISON:  01/30/2017 FINDINGS: The heart size and mediastinal contours are within normal limits. Both lungs are clear. The visualized skeletal structures are unremarkable. IMPRESSION: Clear lungs. Electronically Signed   By: Deatra Robinson M.D.   On: 11/18/2017 17:11    Procedures Procedures (including critical care time)  Medications Ordered in ED Medications - No data to display   Initial Impression / Assessment and Plan / ED Course  I have reviewed the triage vital signs and the nursing notes.  Pertinent labs & imaging results that were available during my care of the patient were reviewed by  me and considered in my medical decision making (see chart for details).     Well-appearing on exam, normal vital signs, unremarkable EKG.  Chest x-ray clear.  Basic lab work is reassuring, negative d-dimer making PE very unlikely, serial troponins negative.  Because her symptoms only lasted 5 minutes and are very atypical, I feel she is safe for outpatient follow-up with cardiology given her HEART score of <3. I reviewed recent ECHO which was normal.  Extensively reviewed return precautions with her and she voiced understanding.  Final Clinical Impressions(s) / ED Diagnoses   Final diagnoses:  Atypical chest pain  Tingling in extremities    ED Discharge Orders    None       Rogan Wigley, Ambrose Finland, MD 11/18/17 2116

## 2017-11-19 ENCOUNTER — Encounter: Payer: Self-pay | Admitting: Family Medicine

## 2017-11-19 ENCOUNTER — Ambulatory Visit (INDEPENDENT_AMBULATORY_CARE_PROVIDER_SITE_OTHER): Payer: BLUE CROSS/BLUE SHIELD | Admitting: Family Medicine

## 2017-11-19 VITALS — BP 113/70 | HR 77 | Ht 66.0 in | Wt 254.0 lb

## 2017-11-19 DIAGNOSIS — M898X1 Other specified disorders of bone, shoulder: Secondary | ICD-10-CM | POA: Diagnosis not present

## 2017-11-19 DIAGNOSIS — M62838 Other muscle spasm: Secondary | ICD-10-CM

## 2017-11-19 DIAGNOSIS — M5412 Radiculopathy, cervical region: Secondary | ICD-10-CM | POA: Diagnosis not present

## 2017-11-19 NOTE — Patient Instructions (Signed)
Thank you for coming in today. Attend PT.  Recheck with me in 1 month.  Return sooner if needed.

## 2017-11-19 NOTE — Progress Notes (Signed)
[name] is a 42 y.o. female who presents to [facility] [place] today for neck chest and shoulder pain.  [name] developed an episode of chest pain and was seen in the emergency room on October 29.  She had central chest pain worse with arm and chest motion.  She is been having some knee pain and soreness for a few weeks now associated with starting weight lifting program.  Fortunately she has been in the process of being worked up for fatigue by her weight loss physician and had had an echocardiogram serendipitously the day prior (28th) which was normal.  In the emergency department she had normal cycle troponins, normal d-dimer, normal EKG and normal chest x-ray.  She is feeling a bit better and notes less chest pain.  She notes pain in her chest, left trapezius, thoracic paraspinal musculature and into the lateral shoulder.  Symptoms been ongoing now for a few weeks worse with activity better with rest.  She denies severe shortness of breath.  She notes some numbness and tingling to her right arm and leg diffusely.  She denies any new medications fevers chills vomiting or diarrhea.   ROS as above:  Exam:  BP 113/70   Pulse 77   Ht '5\' 6"'  (1.676 m)   Wt 254 lb (115.2 kg)   LMP 11/04/2017   BMI 41.00 kg/m  Wt Readings from Last 5 Encounters:  11/19/17 254 lb (115.2 kg)  11/18/17 249 lb (112.9 kg)  11/11/17 250 lb (113.4 kg)  11/05/17 257 lb (116.6 kg)  10/24/17 258 lb (117 kg)    Gen: Well NAD HEENT: EOMI,  MMM Lungs: Normal work of breathing. CTABL Heart: RRR no MRG Chest wall: Tender palpation bilateral sternum Abd: NABS, Soft. Nondistended, Nontender Exts: Brisk capillary refill, warm and well perfused.  MSK:  C-spine: Nontender to spinal midline.  Tender to palpation left trapezius and bilateral paraspinal musculature. Normal neck motion. Upper extremity  strength is intact throughout.  Reflexes are equal normal bilaterally. T-spine: Nontender to spinal midline.  Tender palpation left thoracic paraspinal musculature and periscapular musculature. Some pain with scapular motion. L-spine nontender normal motion.  Left shoulder: Normal-appearing tender palpation left trapezius otherwise nontender. Normal shoulder motion. Normal shoulder strength. Negative impingement testing.  Lab and Radiology Results Results for orders placed or performed during the hospital encounter of 11/18/17 (from the past 72 hour(s))  Basic metabolic panel     Status: Abnormal   Collection Time: 11/18/17  4:52 PM  Result Value Ref Range   Sodium 137 135 - 145 mmol/L   Potassium 3.6 3.5 - 5.1 mmol/L   Chloride 104 98 - 111 mmol/L   CO2 25 22 - 32 mmol/L   Glucose, Bld 95 70 - 99 mg/dL   BUN 22 (H) 6 - 20 mg/dL   Creatinine, Ser 0.77 0.44 - 1.00 mg/dL   Calcium 9.4 8.9 - 10.3 mg/dL   GFR calc non Af Amer >60 >60 mL/min   GFR calc Af Amer >60 >60 mL/min    Comment: (NOTE) The eGFR has been calculated using the CKD EPI equation. This calculation has not been validated in all clinical situations. eGFR's persistently <60 mL/min signify possible Chronic Kidney Disease.    Anion gap 8 5 - 15    Comment: Performed at [facility], [address] [place], [place] [address]  CBC     Status: Abnormal  Collection Time: 11/18/17  4:52 PM  Result Value Ref Range   WBC 13.5 (H) 4.0 - 10.5 K/uL   RBC 4.69 3.87 - 5.11 MIL/uL   Hemoglobin 12.3 12.0 - 15.0 g/dL   HCT 38.8 36.0 - 46.0 %   MCV 82.7 80.0 - 100.0 fL   MCH 26.2 26.0 - 34.0 pg   MCHC 31.7 30.0 - 36.0 g/dL   RDW 14.7 11.5 - 15.5 %   Platelets 412 (H) 150 - 400 K/uL   nRBC 0.0 0.0 - 0.2 %    Comment: Performed at [facility], [address] [place], [place] [address]  Troponin I     Status: None   Collection Time: 11/18/17  4:52 PM  Result Value Ref Range   Troponin I <0.03 <0.03  ng/mL    Comment: Performed at [facility], [address] [place], [place] [address]  Pregnancy, urine     Status: None   Collection Time: 11/18/17  5:26 PM  Result Value Ref Range   Preg Test, Ur NEGATIVE NEGATIVE    Comment:        THE SENSITIVITY OF THIS METHODOLOGY IS >20 mIU/mL. Performed at [facility], [address] [place], [place] [address]   D-dimer, quantitative     Status: None   Collection Time: 11/18/17  6:45 PM  Result Value Ref Range   D-Dimer, Quant 0.41 0.00 - 0.50 ug/mL-FEU    Comment: (NOTE) At the manufacturer cut-off of 0.50 ug/mL FEU, this assay has been documented to exclude PE with a sensitivity and negative predictive value of 97 to 99%.  At this time, this assay has not been approved by the FDA to exclude DVT/VTE. Results should be correlated with clinical presentation. Performed at [facility], [address] [place], [place] [address]   Troponin I     Status: None   Collection Time: 11/18/17  7:28 PM  Result Value Ref Range   Troponin I <0.03 <0.03 ng/mL    Comment: Performed at [facility], [address] [place], [place] [address]   Dg Chest 2 View  Result Date: 11/18/2017 CLINICAL DATA:  Chest pain EXAM: CHEST - 2 VIEW COMPARISON:  01/30/2017 FINDINGS: The heart size and mediastinal contours are within normal limits. Both lungs are clear. The visualized skeletal structures are unremarkable. IMPRESSION: Clear lungs. Electronically Signed   By: Ulyses Jarred M.D.   On: 11/18/2017 17:11   Twelve-lead EKG obtained yesterday reviewed  Study Result   Result status: Final result                           [facility]                        [address], [place] [address]                            [phone]  ------------------------------------------------------------------- Transthoracic Echocardiography  Patient:    [name] MR #:        [id number] Study Date: 11/17/2017 Gender:     F Age:        52 Height:  167.6 cm Weight:     113.4 kg BSA:        2.35 m^2 Pt. Status: Room:   ATTENDING    [place], Outpatient  SONOGRAPHER  [facility], RDCS  ORDERING     [place], [place] [facility]  cc:  ------------------------------------------------------------------- LV EF: 55% -   60%  ------------------------------------------------------------------- Indications:      Dyspnea (R06.00).  ------------------------------------------------------------------- History:   PMH:   Chest pain.  Dyspnea and murmur. No prior cardiac history.  Risk factors:  Obesity, Palpitations. Former tobacco use.   ------------------------------------------------------------------- Study Conclusions  - Left ventricle: The cavity size was normal. Wall thickness was   normal. Systolic function was normal. The estimated ejection   fraction was in the range of 55% to 60%. Wall motion was normal;   there were no regional wall motion abnormalities. Left   ventricular diastolic function parameters were normal.  Impressions:  - Normal study.       Assessment and Plan: 42 y.o. female with  Pain in the left trapezius and bilateral cervical and thoracic paraspinal musculature and periscapular musculature in the left.  Likely muscular dysfunction pain and spasm from weight lifting.  Plan for trial of physical therapy and watchful waiting recheck in 1 month or sooner if needed.  Pain chest is very likely costochondritis and also very likely due to weight lifting.  This should also improve with physical therapy intervention.  Fortunately patient has had an extensive work-up already and is very low risk from a cardiac standpoint.  I do not think we need further work-up right now for this.  Recheck in 1 month or sooner if needed.  I spent 25 minutes with this patient, greater  than 50% was face-to-face time counseling regarding ddx and plan.    Orders Placed This Encounter  Procedures  . Ambulatory referral to Physical Therapy    Referral Priority:   Routine    Referral Type:   Physical Medicine    Referral Reason:   Specialty Services Required    Requested Specialty:   Physical Therapy   No orders of the defined types were placed in this encounter.    Historical information moved to improve visibility of documentation.  Past Medical History:  Diagnosis Date  . ACL (anterior cruciate ligament) tear    MCL. LCL Left knee  . ADHD (attention deficit hyperactivity disorder)    husband  . Anemia   . B12 deficiency   . Blood clot in vein   . Chromosomal abnormality   . Congenital abnormality   . Constipation   . Headache   . HSV (herpes simplex virus) anogenital infection   . Joint pain   . Kidney stone on left side   . Lactose intolerance   . Miscarriage    x 8  . MTHFR (methylene THF reductase) deficiency and homocystinuria ([place])   . Pinched nerve    neck/shoulder  . Plantar fasciitis   . Post partum depression    Past Surgical History:  Procedure Laterality Date  . arm surgery for fracture    . DILATION AND CURETTAGE OF UTERUS  1998  . KNEE SURGERY Left    torn menicus  . TONSILLECTOMY AND ADENOIDECTOMY    . WISDOM TOOTH EXTRACTION     Social History   Tobacco Use  . Smoking status: Former Smoker    Types: Cigarettes    Last attempt to quit:  01/27/2004    Years since quitting: 13.8  . Smokeless tobacco: Never Used  Substance Use Topics  . Alcohol use: No   family history includes Autism spectrum disorder in her daughter; Breast cancer (age of onset: 64) in her maternal aunt; Chiari malformation in her sister; Colon cancer in her maternal uncle; Colon cancer (age of onset: 34) in her mother; Colon cancer (age of onset: 80) in her maternal grandmother; Colon cancer (age of onset: 52) in her other; Colon polyps in her mother; Diabetes  in her paternal grandmother; Heart attack in her maternal grandfather; Hypertension in her maternal grandfather; Lung cancer (age of onset: 82) in her father; Other in her daughter and maternal aunt; Ovarian cancer in her maternal aunt. She was adopted.  Medications: Current Outpatient Medications  Medication Sig Dispense Refill  . acetaminophen (TYLENOL 8 HOUR) 650 MG CR tablet Take 1 tablet by mouth as needed.    . diclofenac sodium (VOLTAREN) 1 % GEL Apply topically as needed.    . diphenhydrAMINE HCl (BENADRYL PO) Take by mouth.    . Lactobacillus (PROBIOTIC ACIDOPHILUS) TABS Take by mouth daily as needed.    . magnesium 30 MG tablet Take 30 mg by mouth 2 (two) times daily.    . meloxicam (MOBIC) 15 MG tablet Take 15 mg by mouth daily.    . Multiple Vitamin (MULTIVITAMIN) tablet Take 1 tablet by mouth daily.    Marland Kitchen VITAMIN D, ERGOCALCIFEROL, PO Take 5,000 Units by mouth daily.     No current facility-administered medications for this visit.    Allergies  Allergen Reactions  . Dairy Aid [Lactase] Other (See Comments)    Excess gas, bloating, diarrhea  . Nickel      Discussed warning signs or symptoms. Please see discharge instructions. Patient expresses understanding.

## 2017-11-20 ENCOUNTER — Encounter: Payer: Self-pay | Admitting: Physical Therapy

## 2017-11-20 ENCOUNTER — Ambulatory Visit: Payer: BLUE CROSS/BLUE SHIELD | Admitting: Physical Therapy

## 2017-11-20 DIAGNOSIS — R293 Abnormal posture: Secondary | ICD-10-CM | POA: Diagnosis not present

## 2017-11-20 DIAGNOSIS — M542 Cervicalgia: Secondary | ICD-10-CM

## 2017-11-20 NOTE — Patient Instructions (Signed)
Access Code: [email or web address]  URL: [email or web address]  Date: 11/20/2017  Prepared by: Moshe Cipro   Exercises  Supine Chin Tuck - 10 reps - 1 sets - 5 sec hold - 2x daily - 7x weekly  Doorway Pec Stretch at 90 Degrees Abduction - 3 reps - 1 sets - 30 sec hold - 1x daily - 7x weekly  Seated Scapular Retraction - 10 reps - 1 sets - 5 sec hold - 2x daily - 7x weekly  Patient Education  Trigger Point Dry Needling

## 2017-11-20 NOTE — Therapy (Signed)
[facility] [address] [place], [place], [address] Phone: [phone]   Fax:  [phone]  Physical Therapy Evaluation  Patient Details  Name: [name] MRN: [id number] Date of Birth: [birth year] Referring Provider (PT): Rodolph Bong, MD   Encounter Date: 11/20/2017  PT End of Session - 11/20/17 1333    Visit Number  1    Number of Visits  12    Date for PT Re-Evaluation  01/01/18    PT Start Time  1154    PT Stop Time  1240    PT Time Calculation (min)  46 min    Activity Tolerance  Patient tolerated treatment well    Behavior During Therapy  [facility] for tasks assessed/performed       Past Medical History:  Diagnosis Date  . ACL (anterior cruciate ligament) tear    MCL. LCL Left knee  . ADHD (attention deficit hyperactivity disorder)    husband  . Anemia   . B12 deficiency   . Blood clot in vein   . Chromosomal abnormality   . Congenital abnormality   . Constipation   . Headache   . HSV (herpes simplex virus) anogenital infection   . Joint pain   . Kidney stone on left side   . Lactose intolerance   . Miscarriage    x 8  . 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 Pinched nerve    neck/shoulder  . Plantar fasciitis   . Post partum depression     Past Surgical History:  Procedure Laterality Date  . arm surgery for fracture    . DILATION AND CURETTAGE OF UTERUS  1998  . KNEE SURGERY Left    torn menicus  . TONSILLECTOMY AND ADENOIDECTOMY    . WISDOM TOOTH EXTRACTION      There were no vitals filed for this visit.   Subjective Assessment - 11/20/17 1156    Subjective  Pt is a 42 y/o female who presents to OPPT for 3-4 week history of upper back and neck pain.  Pt saw PCP with minimal relief but continues to have persistent pain on Lt side.  Pt saw sports med MD and feels it could be coming from neck including numbness and tingling.    Limitations  Lifting;House  hold activities;Sitting    How long can you sit comfortably?  15 min    Patient Stated Goals  improve pain    Currently in Pain?  Yes    Pain Score  0-No pain   up to 8/10 - went to ED   Pain Location  Neck    Pain Orientation  Left    Pain Descriptors / Indicators  Aching;Sharp   crunching   Pain Type  Acute pain    Pain Onset  1 to 4 weeks ago    Pain Frequency  Intermittent    Aggravating Factors   sitting, picking up 42 y/o, driving with arm up (begins to get numbness)    Pain Relieving Factors  mobic, TENS, heat         [facility] PT Assessment - 11/20/17 1202      Assessment   Medical Diagnosis  M54.12 (ICD-10-CM) - Cervical radiculitis    Referring Provider (PT)  Rodolph Bong, MD    Onset Date/Surgical Date  --   3-4 weeks ago   Hand Dominance  Right    Next MD Visit  PRN  Prior Therapy  following ACL      Precautions   Precautions  None      Restrictions   Weight Bearing Restrictions  No      Balance Screen   Has the patient fallen in the past 6 months  No    Has the patient had a decrease in activity level because of a fear of falling?   No    Is the patient reluctant to leave their home because of a fear of falling?   No      Home Environment   Living Environment  Private residence    Living Arrangements  Spouse/significant other;Children   7 children   Additional Comments  78-15 y/o children; still carrying children and helping with ADLs      Prior Function   Level of Independence  Independent    Vocation  Works at home    Leisure  gym/weightlifting, cardio      Cognition   Overall Cognitive Status  Within Functional Limits for tasks assessed      Observation/Other Assessments   Focus on Therapeutic Outcomes (FOTO)   50 (50% limited; predicted 31% limited)      Posture/Postural Control   Posture/Postural Control  Postural limitations    Postural Limitations  Rounded Shoulders;Forward head      ROM / Strength   AROM / PROM / Strength  AROM;Strength       AROM   AROM Assessment Site  Cervical    Cervical Flexion  43    Cervical Extension  39    Cervical - Right Side Bend  37    Cervical - Left Side Bend  21    Cervical - Right Rotation  67    Cervical - Left Rotation  57      Strength   Overall Strength Comments  pain with Lt abduction resistance    Strength Assessment Site  Shoulder    Right/Left Shoulder  Right;Left    Right Shoulder Flexion  5/5    Right Shoulder ABduction  5/5    Right Shoulder Internal Rotation  5/5    Right Shoulder External Rotation  5/5    Left Shoulder Flexion  4/5    Left Shoulder ABduction  3+/5    Left Shoulder Internal Rotation  5/5    Left Shoulder External Rotation  5/5      Palpation   Palpation comment  tightness and trigger points in Lt cervical paraspinals, upper traps, levator, and rhomboids      Special Tests    Special Tests  Cervical    Cervical Tests  Spurling's;Dictraction      Spurling's   Findings  Positive    Side  Left      Distraction Test   Findngs  Positive    Comment  improvement in symptoms                Objective measurements completed on examination: See above findings.      [facility] Adult PT Treatment/Exercise - 11/20/17 1202      Self-Care   Self-Care  Other Self-Care Comments    Other Self-Care Comments   verbally reviewed HEP given by MD and provided to pt; recommended no shoulder shrugs at this time      Modalities   Modalities  Traction      Traction   Type of Traction  Cervical    Min (lbs)  10    Max (lbs)  15  Hold Time  60    Rest Time  20    Time  15             PT Education - 11/20/17 1333    Education Details  HEP, DN    Person(s) Educated  Patient    Methods  Explanation;Demonstration;Handout    Comprehension  Verbalized understanding          PT Long Term Goals - 11/20/17 1359      PT LONG TERM GOAL #1   Title  independent with HEP    Status  New    Target Date  01/01/18      PT LONG TERM GOAL #2    Title  improve FOTO score to </= 35% limited for improved function    Status  New    Target Date  01/01/18      PT LONG TERM GOAL #3   Title  report centralization of symptoms for improved function    Status  New    Target Date  01/01/18      PT LONG TERM GOAL #4   Title  Lt cervical rotation improved by 10 degrees without pain for improved function    Status  New    Target Date  01/01/18             Plan - 11/20/17 1354    Clinical Impression Statement  Pt is a 42 y/o female who presents to OPPT for Lt sided neck and shoulder pain with radicular symptoms.  Pt also with symptoms into RUE but most pain on Lt side.  Pt demonstrates decreased ROM and pain as well as decreased strength and postural abnormalities affecting functional mobility.  Pt will benefit from PT to address deficits.    History and Personal Factors relevant to plan of care:  scheduled for ACL repair in Dec; ADHD, obesity    Clinical Presentation  Evolving    Clinical Presentation due to:  radicular symptoms into bil UEs    Clinical Decision Making  Moderate    Rehab Potential  Good    PT Frequency  2x / week    PT Duration  6 weeks    PT Treatment/Interventions  ADLs/Self Care Home Management;Cryotherapy;Electrical Stimulation;Ultrasound;Traction;Moist Heat;Therapeutic activities;Therapeutic exercise;Patient/family education;Manual techniques;Taping;Dry needling;Passive range of motion    PT Next Visit Plan  review HEP, assess response to traction, manual/modalities/DN PRN    PT Home Exercise Plan  Access Code: [email or web address]     Consulted and Agree with Plan of Care  Patient       Patient will benefit from skilled therapeutic intervention in order to improve the following deficits and impairments:  Increased fascial restricitons, Increased muscle spasms, Pain, Postural dysfunction, Decreased strength, Decreased range of motion, Impaired UE functional use  Visit Diagnosis: Cervicalgia - Plan: PT plan of care  cert/re-cert  Abnormal posture - Plan: PT plan of care cert/re-cert     Problem List Patient Active Problem List   Diagnosis Date Noted  . Family history of colon cancer 12/12/2015  . Plantar fasciitis, bilateral 08/01/2015  . Short interval between pregnancies affecting pregnancy, antepartum 10/01/2013  . Ureteral calculus, left 04/12/2011  . 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 Obesity 10/01/2010  . HSV 10/03/2006  . ATTENTION DEFICIT, W/HYPERACTIVITY 10/29/2005  . Lupus anticoagulant positive 10/29/2005      Clarita Crane, PT, DPT 11/20/17 2:05 PM     [facility] [address] [place], [place], [address]  Phone: [phone]   Fax:  [phone]  Name: LESSLIE MOSSA MRN: [id number] Date of Birth: 04-03-1975

## 2017-11-21 ENCOUNTER — Ambulatory Visit: Payer: BLUE CROSS/BLUE SHIELD | Admitting: Family Medicine

## 2017-11-25 ENCOUNTER — Encounter: Payer: Self-pay | Admitting: Physical Therapy

## 2017-11-25 ENCOUNTER — Ambulatory Visit: Payer: BLUE CROSS/BLUE SHIELD | Admitting: Physical Therapy

## 2017-11-25 DIAGNOSIS — R293 Abnormal posture: Secondary | ICD-10-CM

## 2017-11-25 DIAGNOSIS — M542 Cervicalgia: Secondary | ICD-10-CM | POA: Diagnosis not present

## 2017-11-25 NOTE — Therapy (Signed)
[facility] [address] [place], [place], [address] Phone: [phone]   Fax:  [phone]  Physical Therapy Treatment  Patient Details  Name: [name] MRN: [id number] Date of Birth: November 25, 1975 Referring Provider (PT): Rodolph Bong, MD   Encounter Date: 11/25/2017  PT End of Session - 11/25/17 1105    Visit Number  2    Number of Visits  12    Date for PT Re-Evaluation  01/01/18    PT Start Time  1105    PT Stop Time  1200    PT Time Calculation (min)  55 min    Activity Tolerance  Patient tolerated treatment well    Behavior During Therapy  [facility] for tasks assessed/performed       Past Medical History:  Diagnosis Date  . ACL (anterior cruciate ligament) tear    MCL. LCL Left knee  . ADHD (attention deficit hyperactivity disorder)    husband  . Anemia   . B12 deficiency   . Blood clot in vein   . Chromosomal abnormality   . Congenital abnormality   . Constipation   . Headache   . HSV (herpes simplex virus) anogenital infection   . Joint pain   . Kidney stone on left side   . Lactose intolerance   . Miscarriage    x 8  . 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 Pinched nerve    neck/shoulder  . Plantar fasciitis   . Post partum depression     Past Surgical History:  Procedure Laterality Date  . arm surgery for fracture    . DILATION AND CURETTAGE OF UTERUS  1998  . KNEE SURGERY Left    torn menicus  . TONSILLECTOMY AND ADENOIDECTOMY    . WISDOM TOOTH EXTRACTION      There were no vitals filed for this visit.  Subjective Assessment - 11/25/17 1106    Subjective  "The traction was fantastic.  I was painfree all weekend."  She thinks she may have slept wrong last night.   She hasn't had symptoms in arm for last 4 days.  She is holding off on exercises (gym) until she goes to cardiology.     Patient Stated Goals  improve pain    Currently in Pain?  Yes    Pain  Score  3     Pain Location  Neck    Pain Orientation  Left    Pain Descriptors / Indicators  Aching    Aggravating Factors   driving     Pain Relieving Factors  TENS, heat, medicine       [facility] Adult PT Treatment/Exercise - 11/25/17 0001      Exercises   Exercises  Neck;Shoulder      Neck Exercises: Theraband   Rows  15 reps;Green   5 sec pause in retraction      Neck Exercises: Seated   Neck Retraction  10 reps;3 secs    Other Seated Exercise  Discussed exercises for UE to avoid at gym until symptoms more under control.       Neck Exercises: Supine   Neck Retraction  5 reps;5 secs      Shoulder Exercises: Stretch   Other Shoulder Stretches  3 position doorway stretch-  mid and high level not tolerated well - moved to supine     Other Shoulder Stretches  supine Lt shoulder abdct with arm off of table x 30  sec x 2 reps (90 deg and 120 deg)- then 8 active snow angels.  - pt reported some pinching near [facility] joint with abdct >100      Modalities   Modalities  Moist Heat;Traction      Moist Heat Therapy   Number Minutes Moist Heat  19 Minutes    Moist Heat Location  Cervical   during traction     Traction   Type of Traction  Cervical    Min (lbs)  10    Max (lbs)  15    Hold Time  60    Rest Time  20    Time  15   (19 total time)      Manual Therapy   Manual Therapy  Soft tissue mobilization;Myofascial release    Manual therapy comments  pt in hooklying position     Soft tissue mobilization  STM to bilat upper trap;  pin and stretch to Lt scalenes.     Myofascial Release  MFR to Lt scalenes, upper trap; suboccipital release, Lt pec release          PT Long Term Goals - 11/20/17 1359      PT LONG TERM GOAL #1   Title  independent with HEP    Status  New    Target Date  01/01/18      PT LONG TERM GOAL #2   Title  improve FOTO score to </= 35% limited for improved function    Status  New    Target Date  01/01/18      PT LONG TERM GOAL #3   Title  report  centralization of symptoms for improved function    Status  New    Target Date  01/01/18      PT LONG TERM GOAL #4   Title  Lt cervical rotation improved by 10 degrees without pain for improved function    Status  New    Target Date  01/01/18            Plan - 11/25/17 1153    Clinical Impression Statement  Pt had positive response to traction last visit; short term resolution of symptoms.  She was unable to tolerate standing pec stretch- improved tolerance in supine.   Pt reported some tingling in back of head where traction unit was in contact upon rising; this resolved after a few min.  Pt progressing gradually towards goals.    Rehab Potential  Good    PT Frequency  2x / week    PT Duration  6 weeks    PT Treatment/Interventions  ADLs/Self Care Home Management;Cryotherapy;Electrical Stimulation;Ultrasound;Traction;Moist Heat;Therapeutic activities;Therapeutic exercise;Patient/family education;Manual techniques;Taping;Dry needling;Passive range of motion    PT Next Visit Plan  review HEP, assess response to traction, manual/modalities/DN PRN    PT Home Exercise Plan  Access Code: [email or web address]     Consulted and Agree with Plan of Care  Patient       Patient will benefit from skilled therapeutic intervention in order to improve the following deficits and impairments:  Increased fascial restricitons, Increased muscle spasms, Pain, Postural dysfunction, Decreased strength, Decreased range of motion, Impaired UE functional use  Visit Diagnosis: Cervicalgia  Abnormal posture     Problem List Patient Active Problem List   Diagnosis Date Noted  . Family history of colon cancer 12/12/2015  . Plantar fasciitis, bilateral 08/01/2015  . Short interval between pregnancies affecting pregnancy, antepartum 10/01/2013  . Ureteral calculus, left 04/12/2011  . Chromosomal  abnormality 11/19/2010  . Obesity 10/01/2010  . HSV 10/03/2006  . ATTENTION DEFICIT, W/HYPERACTIVITY 10/29/2005  .  Lupus anticoagulant positive 10/29/2005   Mayer Camel, PTA 11/25/17 1:08 PM  [facility] [address] [place], [place], [address] Phone: [phone]   Fax:  [phone]  Name: JOHARI BENNETTS MRN: [id number] Date of Birth: 21-Jan-1976

## 2017-11-26 ENCOUNTER — Ambulatory Visit (INDEPENDENT_AMBULATORY_CARE_PROVIDER_SITE_OTHER): Payer: BLUE CROSS/BLUE SHIELD | Admitting: Family Medicine

## 2017-11-26 VITALS — BP 116/75 | HR 83 | Temp 98.5°F | Ht 66.0 in | Wt 249.0 lb

## 2017-11-26 DIAGNOSIS — E559 Vitamin D deficiency, unspecified: Secondary | ICD-10-CM | POA: Diagnosis not present

## 2017-11-26 DIAGNOSIS — Z9189 Other specified personal risk factors, not elsewhere classified: Secondary | ICD-10-CM | POA: Diagnosis not present

## 2017-11-26 DIAGNOSIS — E8881 Metabolic syndrome: Secondary | ICD-10-CM

## 2017-11-26 DIAGNOSIS — E88819 Insulin resistance, unspecified: Secondary | ICD-10-CM

## 2017-11-26 DIAGNOSIS — Z6841 Body Mass Index (BMI) 40.0 and over, adult: Secondary | ICD-10-CM

## 2017-11-26 MED ORDER — VITAMIN D (ERGOCALCIFEROL) 1.25 MG (50000 UNIT) PO CAPS: 50000.0000 [IU] | ORAL_CAPSULE | ORAL | 0 refills | Status: DC

## 2017-11-27 ENCOUNTER — Ambulatory Visit (INDEPENDENT_AMBULATORY_CARE_PROVIDER_SITE_OTHER): Payer: BLUE CROSS/BLUE SHIELD | Admitting: Physical Therapy

## 2017-11-27 DIAGNOSIS — M542 Cervicalgia: Secondary | ICD-10-CM | POA: Diagnosis not present

## 2017-11-27 DIAGNOSIS — R293 Abnormal posture: Secondary | ICD-10-CM | POA: Diagnosis not present

## 2017-11-27 NOTE — Progress Notes (Signed)
Office: [phone]  /  Fax: [phone]   HPI:   Chief Complaint: OBESITY [name] is here to discuss her progress with her obesity treatment plan. She is on the Category 3 plan and is following her eating plan approximately 75 % of the time. She states she is doing weight lifting for 45 minutes 1 times per week. [name] had a rough couple of weeks, she ended up going to the emergency department for evaluation. She found to have pinched nerve and muscle spasm. She notes the hardest part is dinner, but she is eating all the food and then Thursday eats at church.  Her weight is 249 lb (112.9 kg) today and has had a weight loss of 1 pound over a period of 2 weeks since her last visit. She has lost 1 lb since starting treatment with [place].  Vitamin D Deficiency [name] has a diagnosis of vitamin D deficiency. She is on OTC Vit D 2,000 IU daily. She notes fatigue and denies nausea, vomiting or muscle weakness.  Insulin Resistance [name] has a diagnosis of insulin resistance based on her elevated fasting insulin level >5. She has no other insulin level on Epic and last Hgb A1c was 5.3. Although [name]'s blood glucose readings are still under good control, insulin resistance puts her at greater risk of metabolic syndrome and diabetes. She is not taking metformin currently and continues to work on diet and exercise to decrease risk of diabetes.  At risk for diabetes [name] is at higher than average risk for developing diabetes due to her obesity and insulin resistance. She currently denies polyuria or polydipsia.  ALLERGIES: Allergies  Allergen Reactions  . Dairy Aid [Lactase] Other (See Comments)    Excess gas, bloating, diarrhea  . Nickel   . No Known Allergies     MEDICATIONS: Current Outpatient Medications on File Prior to Visit  Medication Sig Dispense Refill  . acetaminophen (TYLENOL 8 HOUR) 650 MG CR tablet Take 1 tablet by mouth as needed.    . diclofenac sodium (VOLTAREN) 1 %  GEL Apply topically as needed.    . diphenhydrAMINE HCl (BENADRYL PO) Take by mouth.    . Lactobacillus (PROBIOTIC ACIDOPHILUS) TABS Take by mouth daily as needed.    . magnesium 30 MG tablet Take 30 mg by mouth 2 (two) times daily.    . meloxicam (MOBIC) 15 MG tablet Take 15 mg by mouth daily.    . Multiple Vitamin (MULTIVITAMIN) tablet Take 1 tablet by mouth daily.     No current facility-administered medications on file prior to visit.     PAST MEDICAL HISTORY: Past Medical History:  Diagnosis Date  . ACL (anterior cruciate ligament) tear    MCL. LCL Left knee  . ADHD (attention deficit hyperactivity disorder)    husband  . Anemia   . B12 deficiency   . Blood clot in vein   . Chromosomal abnormality   . Congenital abnormality   . Constipation   . Headache   . HSV (herpes simplex virus) anogenital infection   . Joint pain   . Kidney stone on left side   . Lactose intolerance   . Miscarriage    x 8  . 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 Pinched nerve    neck/shoulder  . Plantar fasciitis   . Post partum depression     PAST SURGICAL HISTORY: Past Surgical History:  Procedure Laterality Date  . arm surgery for fracture    .  DILATION AND CURETTAGE OF UTERUS  1998  . KNEE SURGERY Left    torn menicus  . TONSILLECTOMY AND ADENOIDECTOMY    . WISDOM TOOTH EXTRACTION      SOCIAL HISTORY: Social History   Tobacco Use  . Smoking status: Former Smoker    Types: Cigarettes    Last attempt to quit: 01/27/2004    Years since quitting: 13.8  . Smokeless tobacco: Never Used  Substance Use Topics  . Alcohol use: No  . Drug use: No    FAMILY HISTORY: Family History  Adopted: Yes  Problem Relation Age of Onset  . Autism spectrum disorder Daughter   . Other Daughter        Pancreatic insufficiency.   . Colon cancer Mother 68       died at age 80  . Colon polyps Mother   . Colon cancer Maternal Grandmother 66  . Colon cancer Other  89       mat great grand mother  . Lung cancer Father 72       smoker  . Heart attack Maternal Grandfather   . Hypertension Maternal Grandfather   . Diabetes Paternal Grandmother        type 1  . Breast cancer Maternal Aunt 20  . Ovarian cancer Maternal Aunt   . Other Maternal Aunt        reproductive issues  . Colon cancer Maternal Uncle   . Chiari malformation Sister     ROS: Review of Systems  Constitutional: Positive for malaise/fatigue and weight loss.  Gastrointestinal: Negative for nausea and vomiting.  Genitourinary: Negative for frequency.  Musculoskeletal:       Negative muscle weakness  Endo/Heme/Allergies: Negative for polydipsia.    PHYSICAL EXAM: Blood pressure 116/75, pulse 83, temperature 98.5 F (36.9 C), temperature source Oral, height 5\' 6"  (1.676 m), weight 249 lb (112.9 kg), last menstrual period 11/04/2017, SpO2 99 %, unknown if currently breastfeeding. Body mass index is 40.19 kg/m. Physical Exam  Constitutional: She is oriented to person, place, and time. She appears well-developed and well-nourished.  Cardiovascular: Normal rate.  Pulmonary/Chest: Effort normal.  Musculoskeletal: Normal range of motion.  Neurological: She is oriented to person, place, and time.  Skin: Skin is warm and dry.  Psychiatric: She has a normal mood and affect. Her behavior is normal.  Vitals reviewed.   RECENT LABS AND TESTS: BMET    Component Value Date/Time   NA 137 11/18/2017 1652   NA 140 11/11/2017 1249   K 3.6 11/18/2017 1652   CL 104 11/18/2017 1652   CO2 25 11/18/2017 1652   GLUCOSE 95 11/18/2017 1652   BUN 22 (H) 11/18/2017 1652   BUN 11 11/11/2017 1249   CREATININE 0.77 11/18/2017 1652   CREATININE 0.71 10/03/2016 1152   CALCIUM 9.4 11/18/2017 1652   GFRNONAA >60 11/18/2017 1652   GFRNONAA 107 10/03/2016 1152   GFRAA >60 11/18/2017 1652   GFRAA 124 10/03/2016 1152   Lab Results  Component Value Date   HGBA1C 5.3 11/11/2017   HGBA1C 5.1  01/19/2010   Lab Results  Component Value Date   INSULIN 8.9 11/11/2017   CBC    Component Value Date/Time   WBC 13.5 (H) 11/18/2017 1652   RBC 4.69 11/18/2017 1652   HGB 12.3 11/18/2017 1652   HGB 11.9 11/11/2017 1249   HCT 38.8 11/18/2017 1652   HCT 36.8 11/11/2017 1249   PLT 412 (H) 11/18/2017 1652   MCV 82.7 11/18/2017 1652  MCV 82 11/11/2017 1249   MCH 26.2 11/18/2017 1652   MCHC 31.7 11/18/2017 1652   RDW 14.7 11/18/2017 1652   RDW 14.5 11/11/2017 1249   LYMPHSABS 2.8 11/11/2017 1249   MONOABS 0.8 01/26/2017 1055   EOSABS 0.2 11/11/2017 1249   BASOSABS 0.1 11/11/2017 1249   Iron/TIBC/Ferritin/ %Sat    Component Value Date/Time   FERRITIN 24 12/08/2015 1418   Lipid Panel     Component Value Date/Time   CHOL 163 11/11/2017 1249   TRIG 128 11/11/2017 1249   HDL 41 11/11/2017 1249   LDLCALC 96 11/11/2017 1249   Hepatic Function Panel     Component Value Date/Time   PROT 7.5 11/11/2017 1249   ALBUMIN 4.3 11/11/2017 1249   AST 12 11/11/2017 1249   ALT 10 11/11/2017 1249   ALKPHOS 71 11/11/2017 1249   BILITOT 0.3 11/11/2017 1249      Component Value Date/Time   TSH 1.300 11/11/2017 1249   TSH 1.13 10/03/2016 1152   TSH 1.12 12/08/2015 1418   Results for JAZIYAH, GRADEL (MRN [id number]) as of 11/27/2017 17:12  Ref. Range 11/11/2017 12:49  Vitamin D, 25-Hydroxy Latest Ref Range: 30.0 - 100.0 ng/mL 38.8   ASSESSMENT AND PLAN: Vitamin D deficiency - Plan: Vitamin D, Ergocalciferol, (DRISDOL) 1.25 MG (50000 UT) CAPS capsule  Insulin resistance  At risk for diabetes mellitus  Class 3 severe obesity with serious comorbidity and body mass index (BMI) of 40.0 to 44.9 in adult, unspecified obesity type (HCC)  PLAN:  Vitamin D Deficiency Tishie was informed that low vitamin D levels contributes to fatigue and are associated with obesity, breast, and colon cancer. Devine agrees to start prescription Vit D @50 ,000 IU every week #4 with no refills. She will  follow up for routine testing of vitamin D, at least 2-3 times per year. She was informed of the risk of over-replacement of vitamin D and agrees to not increase her dose unless she discusses this with [place] first. Karee agrees to follow up with our clinic in 2 weeks.  Insulin Resistance Sherhonda will continue to work on weight loss, exercise, and decreasing simple carbohydrates in her diet to help decrease the risk of diabetes. We dicussed metformin including benefits and risks. She was informed that eating too many simple carbohydrates or too many calories at one sitting increases the likelihood of GI side effects. Thayer declined metformin for now and prescription was not written today. We will repeat labs in 3 months. Brandilynn agrees to follow up with our clinic in 2 weeks as directed to monitor her progress.  Diabetes risk counselling Angelika was given extended (30 minutes) diabetes prevention counseling today. She is 42 y.o. female and has risk factors for diabetes including obesity and insulin resistance. We discussed intensive lifestyle modifications today with an emphasis on weight loss as well as increasing exercise and decreasing simple carbohydrates in her diet.  Obesity Sharonna is currently in the action stage of change. As such, her goal is to continue with weight loss efforts She has agreed to follow the Category 3 plan Lawrie has been instructed to work up to a goal of 150 minutes of combined cardio and strengthening exercise per week for weight loss and overall health benefits. We discussed the following Behavioral Modification Strategies today: increasing lean protein intake, increasing vegetables, work on meal planning and easy cooking plans, and planning for success   Maricruz has agreed to follow up with our clinic in 2 weeks. She was informed  of the importance of frequent follow up visits to maximize her success with intensive lifestyle modifications for her multiple health  conditions.   OBESITY BEHAVIORAL INTERVENTION VISIT  Today's visit was # 2   Starting weight: 250 lbs Starting date: 11/11/17 Today's weight : 249 lbs Today's date: 11/26/2017 Total lbs lost to date: 1    ASK: We discussed the diagnosis of obesity with Duwaine Maxin today and Herbert Seta agreed to give [place] permission to discuss obesity behavioral modification therapy today.  ASSESS: Lajoya has the diagnosis of obesity and her BMI today is 40.21 Veona is in the action stage of change   ADVISE: Kaleeyah was educated on the multiple health risks of obesity as well as the benefit of weight loss to improve her health. She was advised of the need for long term treatment and the importance of lifestyle modifications to improve her current health and to decrease her risk of future health problems.  AGREE: Multiple dietary modification options and treatment options were discussed and  Sharnese agreed to follow the recommendations documented in the above note.  ARRANGE: Apollonia was educated on the importance of frequent visits to treat obesity as outlined per CMS and USPSTF guidelines and agreed to schedule her next follow up appointment today.  I, Burt Knack, am acting as transcriptionist for Debbra Riding, MD  I have reviewed the above documentation for accuracy and completeness, and I agree with the above. - Debbra Riding, MD

## 2017-11-27 NOTE — Therapy (Signed)
[facility] [place] [address] [place], [place], [address] Phone: [phone]   Fax:  [phone]  Physical Therapy Treatment  Patient Details  Name: [name] MRN: [id number] Date of Birth: Mar 31, 1975 Referring Provider (PT): Rodolph Bong, MD   Encounter Date: 11/27/2017  PT End of Session - 11/27/17 1707    Visit Number  3    Number of Visits  12    Date for PT Re-Evaluation  01/01/18    PT Start Time  1702    PT Stop Time  1753    PT Time Calculation (min)  51 min    Activity Tolerance  Patient tolerated treatment well;No increased pain    Behavior During Therapy  WFL for tasks assessed/performed       Past Medical History:  Diagnosis Date  . ACL (anterior cruciate ligament) tear    MCL. LCL Left knee  . ADHD (attention deficit hyperactivity disorder)    husband  . Anemia   . B12 deficiency   . Blood clot in vein   . Chromosomal abnormality   . Congenital abnormality   . Constipation   . Headache   . HSV (herpes simplex virus) anogenital infection   . Joint pain   . Kidney stone on left side   . Lactose intolerance   . Miscarriage    x 8  . 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 Pinched nerve    neck/shoulder  . Plantar fasciitis   . Post partum depression     Past Surgical History:  Procedure Laterality Date  . arm surgery for fracture    . DILATION AND CURETTAGE OF UTERUS  1998  . KNEE SURGERY Left    torn menicus  . TONSILLECTOMY AND ADENOIDECTOMY    . WISDOM TOOTH EXTRACTION      There were no vitals filed for this visit.  Subjective Assessment - 11/27/17 1708    Subjective  Pt reports she is almost painfree turning head in supine; pain 2-3/10 when upright.  She went to gym yesterday and did: rear delts, legs, row (40#) bilat, unilateral without pain.  "I less crinkling in neck with a bend it side to side".     Currently in Pain?  No/denies    Pain  Score  0-No pain    Pain Location  Neck         [facility] PT Assessment - 11/27/17 0001      Assessment   Medical Diagnosis  M54.12 (ICD-10-CM) - Cervical radiculitis    Referring Provider (PT)  Rodolph Bong, MD    Hand Dominance  Right    Next MD Visit  PRN    Prior Therapy  following ACL      [facility] PT Treatment/Exercise - 11/27/17 0001      Exercises   Exercises  Neck      Neck Exercises: Machines for Strengthening   UBE (Upper Arm Bike)  L1: 1.5 min each direction      Neck Exercises: Standing   Other Standing Exercises  Row with blue band x 5 sets of 10.       Neck Exercises: Prone   W Back  5 reps   with axial ext   Shoulder Extension  5 reps   5 sec hold, with axial ext   Other Prone Exercise  opp arm / leg x 5 reps each     Other Prone Exercise  childs pose x 20 sec;  cervical diagonals x 5 each side.       Shoulder Exercises: Stretch   Other Shoulder Stretches  3 position doorway stretch-  high level position unilateral. - improved tolerance- 30 sec x 2 reps each position    Other Shoulder Stretches  bicep stretch bilat with hands holding band behind back x 10 sec x 4 reps       Traction   Type of Traction  Cervical    Min (lbs)  10    Max (lbs)  18    Hold Time  60    Rest Time  20    Time  10   (14 total time)      Manual Therapy   Manual therapy comments  Pt sitting    Soft tissue mobilization  IASTM to bilat upper trap, scalene, mid trap, and cervical parasinals to decrease fascial tightness       Neck Exercises: Stretches   Upper Trapezius Stretch  Right;Left;2 reps;10 seconds                  PT Long Term Goals - 11/20/17 1359      PT LONG TERM GOAL #1   Title  independent with HEP    Status  New    Target Date  01/01/18      PT LONG TERM GOAL #2   Title  improve FOTO score to </= 35% limited for improved function    Status  New    Target Date  01/01/18      PT LONG TERM GOAL #3   Title  report centralization of symptoms  for improved function    Status  New    Target Date  01/01/18      PT LONG TERM GOAL #4   Title  Lt cervical rotation improved by 10 degrees without pain for improved function    Status  New    Target Date  01/01/18            Plan - 11/27/17 1743    Clinical Impression Statement  Improved tolerance to pec stretch in standing today.  Pt has some clicking and popping happening at Lt bicep tendon (prox) and [place] joint with shoulder motions.  She tolerated all new exercises well, requiring very minimal cues for form.  Progressing well towards goals.     Rehab Potential  Good    PT Frequency  2x / week    PT Duration  6 weeks    PT Treatment/Interventions  ADLs/Self Care Home Management;Cryotherapy;Electrical Stimulation;Ultrasound;Traction;Moist Heat;Therapeutic activities;Therapeutic exercise;Patient/family education;Manual techniques;Taping;Dry needling;Passive range of motion    PT Next Visit Plan  possible Lt [place] mobs/ DN or manual therapy, continue traction and progressive spinal stabilization - assess response to increased pull in traction.     Recommended Other Services  approved codes: estim unattended, traction, ionto, manual therapy, TA, TE, self care, eval.     Consulted and Agree with Plan of Care  Patient       Patient will benefit from skilled therapeutic intervention in order to improve the following deficits and impairments:  Increased fascial restricitons, Increased muscle spasms, Pain, Postural dysfunction, Decreased strength, Decreased range of motion, Impaired UE functional use  Visit Diagnosis: Cervicalgia  Abnormal posture     Problem List Patient Active Problem List   Diagnosis Date Noted  . Encounter for psychological assessment prior to bariatric surgery 07/08/2017  . Psychological factors affecting morbid obesity (HCC)  07/08/2017  . Arthritis 06/18/2017  . Chronic foot pain 06/18/2017  . Family history of colon cancer 12/12/2015  . Plantar fasciitis,  bilateral 08/01/2015  . Short interval between pregnancies affecting pregnancy, antepartum 10/01/2013  . Family history of congenital heart defect 07/17/2012  . Ureteral calculus, left 04/12/2011  . 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 Morbid obesity (HCC) 10/01/2010  . HSV 10/03/2006  . ATTENTION DEFICIT, W/HYPERACTIVITY 10/29/2005  . Lupus anticoagulant positive 10/29/2005   Mayer Camel, PTA 11/27/17 5:50 PM  [facility] [place] [address] [place], [place], [address] Phone: [phone]   Fax:  [phone]  Name: [name] MRN: [id number] Date of Birth: 1975/08/18

## 2017-12-01 ENCOUNTER — Ambulatory Visit: Payer: BLUE CROSS/BLUE SHIELD | Admitting: Cardiology

## 2017-12-01 ENCOUNTER — Telehealth: Payer: Self-pay

## 2017-12-01 ENCOUNTER — Encounter: Payer: Self-pay | Admitting: Cardiology

## 2017-12-01 DIAGNOSIS — R079 Chest pain, unspecified: Secondary | ICD-10-CM

## 2017-12-01 NOTE — Progress Notes (Signed)
Cardiology Office Note:    Date:  12/01/2017   ID:  [name], DOB [birth year], MRN [id number]  PCP:  Agapito Games, MD  Cardiologist:  Garwin Brothers, MD   Referring MD: Agapito Games, *    ASSESSMENT:    1. Chest pain, unspecified type    PLAN:    In order of problems listed above:  1. I discussed my findings with the patient at extensive length.  She has history suggesting postural hypotension and manages were discussed with him such as compression stockings and other measures especially since he is changing posture.  Fall precaution was discussed.  I told her to increase her salt and water a little more than what she is doing now. 2. In view of her symptoms she will have an Lexiscan sestamibi.  I wanted to do a treadmill stress test but she has orthopedic issues and her orthopedic doctor has told her to refrain from using a treadmill..  Her symptoms are atypical for coronary artery disease but she is very concerned of the symptoms.  She also wants to exercise on a regular basis this she does not exercise bicycle. 3. Patient will be seen in follow-up appointment in 6 months or earlier if the patient has any concerns    Medication Adjustments/Labs and Tests Ordered: Current medicines are reviewed at length with the patient today.  Concerns regarding medicines are outlined above.  No orders of the defined types were placed in this encounter.  No orders of the defined types were placed in this encounter.    History of Present Illness:    [name] is a 42 y.o. female who is being seen today for the evaluation of chest pain at the request of Agapito Games, *.  Patient is a pleasant 42 year old female.  She is overall a healthy lady.  She has been referred here for chest discomfort-like symptoms.  She tells me that she has a chromosomal abnormality and I am not very clear about the details.  She says that when she tries to get up or change  posture she has a feeling of dizziness.  She has never passed out.  She was told that she had a murmur at birth but subsequently it resolved.  Her recent echocardiogram was read as normal.  At the time of my evaluation, the patient is alert awake oriented and in no distress.  She is an active lady she complains of chest pain not related to exertion.  She exercises vigorously on a regular basis.  She tells me that she has lost 65 pounds since the month of May.  At the time of my evaluation, the patient is alert awake oriented and in no distress.  Past Medical History:  Diagnosis Date  . ACL (anterior cruciate ligament) tear    MCL. LCL Left knee  . ADHD (attention deficit hyperactivity disorder)    husband  . Anemia   . B12 deficiency   . Blood clot in vein   . Chromosomal abnormality   . Congenital abnormality   . Constipation   . Headache   . HSV (herpes simplex virus) anogenital infection   . Joint pain   . Kidney stone on left side   . Lactose intolerance   . Miscarriage    x 8  . 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 Pinched nerve    neck/shoulder  . Plantar fasciitis   . Post partum depression  Past Surgical History:  Procedure Laterality Date  . arm surgery for fracture    . DILATION AND CURETTAGE OF UTERUS  1998  . KNEE SURGERY Left    torn menicus  . TONSILLECTOMY AND ADENOIDECTOMY    . WISDOM TOOTH EXTRACTION      Current Medications: Current Meds  Medication Sig  . acetaminophen (TYLENOL 8 HOUR) 650 MG CR tablet Take 1 tablet by mouth as needed.  . diclofenac sodium (VOLTAREN) 1 % GEL Apply topically as needed.  . diphenhydrAMINE HCl (BENADRYL PO) Take 1 tablet by mouth as needed.   . Lactobacillus (PROBIOTIC ACIDOPHILUS) TABS Take by mouth daily as needed.  . magnesium 30 MG tablet Take 30 mg by mouth as needed.   . meloxicam (MOBIC) 15 MG tablet Take 15 mg by mouth as needed.   . Multiple Vitamin (MULTIVITAMIN) tablet  Take 1 tablet by mouth daily.  . Vitamin D, Ergocalciferol, (DRISDOL) 1.25 MG (50000 UT) CAPS capsule Take 1 capsule (50,000 Units total) by mouth every 7 (seven) days.     Allergies:   Dairy aid [lactase]; Nickel; and No known allergies   Social History   Socioeconomic History  . Marital status: Married    Spouse name: [name]  . Number of children: 8  . Years of education: college  . Highest education level: Not on file  Occupational History  . Occupation: Futures trader  Social Needs  . Financial resource strain: Not on file  . Food insecurity:    Worry: Not on file    Inability: Not on file  . Transportation needs:    Medical: Not on file    Non-medical: Not on file  Tobacco Use  . Smoking status: Former Smoker    Types: Cigarettes    Last attempt to quit: 01/27/2004    Years since quitting: 13.8  . Smokeless tobacco: Never Used  Substance and Sexual Activity  . Alcohol use: No  . Drug use: No  . Sexual activity: Yes    Partners: Male  Lifestyle  . Physical activity:    Days per week: Not on file    Minutes per session: Not on file  . Stress: Not on file  Relationships  . Social connections:    Talks on phone: Not on file    Gets together: Not on file    Attends religious service: Not on file    Active member of club or organization: Not on file    Attends meetings of clubs or organizations: Not on file    Relationship status: Not on file  Other Topics Concern  . Not on file  Social History Narrative  . Not on file     Family History: The patient's family history includes Autism spectrum disorder in her daughter; Breast cancer (age of onset: 36) in her maternal aunt; Chiari malformation in her sister; Colon cancer in her maternal uncle; Colon cancer (age of onset: 16) in her mother; Colon cancer (age of onset: 26) in her maternal grandmother; Colon cancer (age of onset: 55) in her other; Colon polyps in her mother; Diabetes in her paternal grandmother; Heart attack  in her maternal grandfather; Hypertension in her maternal grandfather; Lung cancer (age of onset: 43) in her father; Other in her daughter and maternal aunt; Ovarian cancer in her maternal aunt. She was adopted.  ROS:   Please see the history of present illness.    All other systems reviewed and are negative.  EKGs/Labs/Other Studies Reviewed:  The following studies were reviewed today: I reviewed her EKGs from previous evaluations   Recent Labs: 11/11/2017: ALT 10; TSH 1.300 11/18/2017: BUN 22; Creatinine, Ser 0.77; Hemoglobin 12.3; Platelets 412; Potassium 3.6; Sodium 137  Recent Lipid Panel    Component Value Date/Time   CHOL 163 11/11/2017 1249   TRIG 128 11/11/2017 1249   HDL 41 11/11/2017 1249   LDLCALC 96 11/11/2017 1249    Physical Exam:    VS:  BP 120/66 (BP Location: Right Arm, Patient Position: Sitting, Cuff Size: Normal)   Pulse 80   Ht 5\' 6"  (1.676 m)   Wt 256 lb (116.1 kg)   LMP 11/04/2017   SpO2 98%   BMI 41.32 kg/m     Wt Readings from Last 3 Encounters:  12/01/17 256 lb (116.1 kg)  11/26/17 249 lb (112.9 kg)  11/19/17 254 lb (115.2 kg)     GEN: Patient is in no acute distress HEENT: Normal NECK: No JVD; No carotid bruits LYMPHATICS: No lymphadenopathy CARDIAC: S1 S2 regular, 2/6 systolic murmur at the apex. RESPIRATORY:  Clear to auscultation without rales, wheezing or rhonchi  ABDOMEN: Soft, non-tender, non-distended MUSCULOSKELETAL:  No edema; No deformity  SKIN: Warm and dry NEUROLOGIC:  Alert and oriented x 3 PSYCHIATRIC:  Normal affect    Signed, Garwin Brothers, MD  12/01/2017 10:07 AM    [facility]

## 2017-12-01 NOTE — Telephone Encounter (Signed)
Left voicemail for the patient to call her surgeon for clearance regarding being able to perform a stress echo as the patient cannot find a date with her schedule to have the lexi completed. This is per Dr. Kem Parkinson recommendation.

## 2017-12-01 NOTE — Patient Instructions (Addendum)
Medication Instructions:  Your physician recommends that you continue on your current medications as directed. Please refer to the Current Medication list given to you today.  If you need a refill on your cardiac medications before your next appointment, please call your pharmacy.   Lab work: None  If you have labs (blood work) drawn today and your tests are completely normal, you will receive your results only by: Marland Kitchen MyChart Message (if you have MyChart) OR . A paper copy in the mail If you have any lab test that is abnormal or we need to change your treatment, we will call you to review the results.  Testing/Procedures: Your physician has requested that you have a lexiscan myoview. For further information please visit [email or web address] Please follow instruction sheet, as given.  Follow-Up: At [facility], you and your health needs are our priority.  As part of our continuing mission to provide you with exceptional heart care, we have created designated Provider Care Teams.  These Care Teams include your primary Cardiologist (physician) and Advanced Practice Providers (APPs -  Physician Assistants and Nurse Practitioners) who all work together to provide you with the care you need, when you need it.  You will need a follow up appointment in 3 months.  Please call our office 2 months in advance to schedule this appointment.  You may see another member of our [employer] Provider Team in [place]: Gypsy Balsam, MD . Norman Herrlich, MD  Any Other Special Instructions Will Be Listed Below (If Applicable).

## 2017-12-01 NOTE — Progress Notes (Deleted)
.  saw

## 2017-12-02 ENCOUNTER — Encounter: Payer: Self-pay | Admitting: Cardiology

## 2017-12-02 NOTE — Addendum Note (Signed)
Addended by: Craige Cotta on: 12/02/2017 10:43 AM   Modules accepted: Orders

## 2017-12-03 ENCOUNTER — Ambulatory Visit: Payer: BLUE CROSS/BLUE SHIELD | Admitting: Physical Therapy

## 2017-12-03 ENCOUNTER — Encounter: Payer: Self-pay | Admitting: Physical Therapy

## 2017-12-03 DIAGNOSIS — M542 Cervicalgia: Secondary | ICD-10-CM

## 2017-12-03 DIAGNOSIS — R293 Abnormal posture: Secondary | ICD-10-CM

## 2017-12-03 NOTE — Therapy (Signed)
[facility] [address] [place], [place], [address] Phone: [phone]   Fax:  [phone]  Physical Therapy Treatment  Patient Details  Name: [name] MRN: [id number] Date of Birth: 08/09/1975 Referring Provider (PT): Rodolph Bongorey, Evan S, MD   Encounter Date: 12/03/2017  PT End of Session - 12/03/17 1208    Visit Number  4    Number of Visits  12    Date for PT Re-Evaluation  01/01/18    PT Start Time  1120    PT Stop Time  1159    PT Time Calculation (min)  39 min    Activity Tolerance  Patient tolerated treatment well;No increased pain    Behavior During Therapy  WFL for tasks assessed/performed       Past Medical History:  Diagnosis Date  . ACL (anterior cruciate ligament) tear    MCL. LCL Left knee  . ADHD (attention deficit hyperactivity disorder)    husband  . Anemia   . B12 deficiency   . Blood clot in vein   . Chromosomal abnormality   . Congenital abnormality   . Constipation   . Headache   . HSV (herpes simplex virus) anogenital infection   . Joint pain   . Kidney stone on left side   . Lactose intolerance   . Miscarriage    x 8  . 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 Pinched nerve    neck/shoulder  . Plantar fasciitis   . Post partum depression     Past Surgical History:  Procedure Laterality Date  . arm surgery for fracture    . DILATION AND CURETTAGE OF UTERUS  1998  . KNEE SURGERY Left    torn menicus  . TONSILLECTOMY AND ADENOIDECTOMY    . WISDOM TOOTH EXTRACTION      There were no vitals filed for this visit.  Subjective Assessment - 12/03/17 1121    Subjective  "whatever we did last time left me in extreme pain, and I'm not doing it."  feels like she back to square one.  still has a headache.    Patient Stated Goals  improve pain    Pain Score  5     Pain Location  Neck    Pain Orientation  Left    Pain Descriptors / Indicators  Aching     Pain Type  Acute pain    Pain Onset  1 to 4 weeks ago    Pain Frequency  Intermittent    Aggravating Factors   driving    Pain Relieving Factors  TENS, heat, medicine                       [facility] Adult PT Treatment/Exercise - 12/03/17 1123      Neck Exercises: Machines for Strengthening   UBE (Upper Arm Bike)  L1: 2 min each direction      Manual Therapy   Manual Therapy  Soft tissue mobilization;Joint mobilization    Manual therapy comments  pt prone and supine    Joint Mobilization  CPA grades 1-2 C4-6; lateral glides Lt -> Rt grades 1-2 C4-6    Soft tissue mobilization  suboccipitals, upper trap and cervical paraspinals       Trigger Point Dry Needling - 12/03/17 1207    Consent Given?  Yes    Education Handout Provided  Yes    Muscles Treated Upper Body  Longissimus  Lt cervical paraspinals   Longissimus Response  Twitch response elicited;Palpable increased muscle length                PT Long Term Goals - 11/20/17 1359      PT LONG TERM GOAL #1   Title  independent with HEP    Status  New    Target Date  01/01/18      PT LONG TERM GOAL #2   Title  improve FOTO score to </= 35% limited for improved function    Status  New    Target Date  01/01/18      PT LONG TERM GOAL #3   Title  report centralization of symptoms for improved function    Status  New    Target Date  01/01/18      PT LONG TERM GOAL #4   Title  Lt cervical rotation improved by 10 degrees without pain for improved function    Status  New    Target Date  01/01/18            Plan - 12/03/17 1208    Clinical Impression Statement  Pt arrived today with increased neck pain and headache following last PT session.  Pt with active trigger points in Lt cervical paraspinals which responded well to DN and manual therapy today.  Pt reported decreased pain and tightness following DN.  Pt may need to hold PT as daughter is having major surgery next week and pt will need to  help care for daughter.  Scheduled at this time for one more DN session prior to daughter's surgery.    Rehab Potential  Good    PT Frequency  2x / week    PT Duration  6 weeks    PT Treatment/Interventions  ADLs/Self Care Home Management;Cryotherapy;Electrical Stimulation;Ultrasound;Traction;Moist Heat;Therapeutic activities;Therapeutic exercise;Patient/family education;Manual techniques;Taping;Dry needling;Passive range of motion    PT Next Visit Plan  possible Lt [place] mobs/ DN or manual therapy, continue traction and progressive spinal stabilization - assess response to DN    PT Home Exercise Plan  Access Code: [email or web address]     Consulted and Agree with Plan of Care  Patient       Patient will benefit from skilled therapeutic intervention in order to improve the following deficits and impairments:  Increased fascial restricitons, Increased muscle spasms, Pain, Postural dysfunction, Decreased strength, Decreased range of motion, Impaired UE functional use  Visit Diagnosis: Cervicalgia  Abnormal posture     Problem List Patient Active Problem List   Diagnosis Date Noted  . Chest pain 12/01/2017  . Encounter for psychological assessment prior to bariatric surgery 07/08/2017  . Psychological factors affecting morbid obesity (HCC) 07/08/2017  . Arthritis 06/18/2017  . Chronic foot pain 06/18/2017  . Family history of colon cancer 12/12/2015  . Plantar fasciitis, bilateral 08/01/2015  . Short interval between pregnancies affecting pregnancy, antepartum 10/01/2013  . Family history of congenital heart defect 07/17/2012  . Ureteral calculus, left 04/12/2011  . 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 Morbid obesity (HCC) 10/01/2010  . HSV 10/03/2006  . ATTENTION DEFICIT, W/HYPERACTIVITY 10/29/2005  . Lupus anticoagulant positive 10/29/2005      Clarita Crane, PT, DPT 12/03/17 12:14 PM     [facility] [address] [place], [place], [address] Phone: [phone]   Fax:  [phone]  Name: [name] MRN: [id number] Date of Birth: 03-08-1975

## 2017-12-04 ENCOUNTER — Encounter: Payer: BLUE CROSS/BLUE SHIELD | Admitting: Physical Therapy

## 2017-12-05 ENCOUNTER — Encounter (INDEPENDENT_AMBULATORY_CARE_PROVIDER_SITE_OTHER): Payer: Self-pay | Admitting: Bariatrics

## 2017-12-08 ENCOUNTER — Ambulatory Visit (INDEPENDENT_AMBULATORY_CARE_PROVIDER_SITE_OTHER): Payer: BLUE CROSS/BLUE SHIELD

## 2017-12-08 DIAGNOSIS — R079 Chest pain, unspecified: Secondary | ICD-10-CM | POA: Diagnosis not present

## 2017-12-08 NOTE — Progress Notes (Signed)
Stress echocardiogram has been performed.  Jimmy Tenleigh Byer RDCS, RVT 

## 2017-12-09 ENCOUNTER — Ambulatory Visit (INDEPENDENT_AMBULATORY_CARE_PROVIDER_SITE_OTHER): Payer: BLUE CROSS/BLUE SHIELD | Admitting: Physical Therapy

## 2017-12-09 ENCOUNTER — Encounter: Payer: Self-pay | Admitting: Physical Therapy

## 2017-12-09 DIAGNOSIS — R293 Abnormal posture: Secondary | ICD-10-CM

## 2017-12-09 DIAGNOSIS — M542 Cervicalgia: Secondary | ICD-10-CM

## 2017-12-09 NOTE — Therapy (Addendum)
[facility] [place], [place], [address] Phone: [phone]   Fax:  [phone]  Physical Therapy Treatment/Recertification/Discharge  Patient Details  Name: [name] MRN: [id number] Date of Birth: 01/13/1976 Referring Provider (PT): Gregor Hams, MD   Encounter Date: 12/09/2017  PT End of Session - 12/09/17 1307    Visit Number  5    Number of Visits  12    Date for PT Re-Evaluation  01/20/18    PT Start Time  1115    PT Stop Time  1155    PT Time Calculation (min)  40 min    Activity Tolerance  Patient tolerated treatment well;No increased pain    Behavior During Therapy  WFL for tasks assessed/performed       Past Medical History:  Diagnosis Date  . ACL (anterior cruciate ligament) tear    MCL. LCL Left knee  . ADHD (attention deficit hyperactivity disorder)    husband  . Anemia   . B12 deficiency   . Blood clot in vein   . Chromosomal abnormality   . Congenital abnormality   . Constipation   . Headache   . HSV (herpes simplex virus) anogenital infection   . Joint pain   . Kidney stone on left side   . Lactose intolerance   . Miscarriage    x 8  . MTHFR (methylene THF reductase) deficiency and homocystinuria ([place])   . Pinched nerve    neck/shoulder  . Plantar fasciitis   . Post partum depression     Past Surgical History:  Procedure Laterality Date  . arm surgery for fracture    . DILATION AND CURETTAGE OF UTERUS  1998  . KNEE SURGERY Left    torn menicus  . TONSILLECTOMY AND ADENOIDECTOMY    . WISDOM TOOTH EXTRACTION      There were no vitals filed for this visit.  Subjective Assessment - 12/09/17 1118    Subjective  DN made her feel 95% better    Patient Stated Goals  improve pain    Pain Score  0-No pain    Pain Location  Neck                       [facility] Adult PT Treatment/Exercise - 12/09/17 1121      Neck Exercises: Machines for Strengthening    UBE (Upper Arm Bike)  L1: 2 min each direction      Manual Therapy   Manual Therapy  Soft tissue mobilization    Manual therapy comments  pt prone, skilled palpation and monitoring of soft tissue during DN    Soft tissue mobilization  IASTM to Lt upper traps, levator scapula, rhomboids and infraspinatus       Trigger Point Dry Needling - 12/09/17 1306    Consent Given?  Yes    Muscles Treated Upper Body  Upper trapezius;Infraspinatus;Levator scapulae    Upper Trapezius Response  Twitch reponse elicited;Palpable increased muscle length    Levator Scapulae Response  Twitch response elicited;Palpable increased muscle length    Infraspinatus Response  Twitch response elicited;Palpable increased muscle length                PT Long Term Goals - 12/09/17 1308      PT LONG TERM GOAL #1   Title  independent with HEP    Status  On-going    Target Date  01/20/18      PT  LONG TERM GOAL #2   Title  improve FOTO score to </= 35% limited for improved function    Status  On-going    Target Date  01/20/18      PT LONG TERM GOAL #3   Title  report centralization of symptoms for improved function    Status  Achieved      PT LONG TERM GOAL #4   Title  Lt cervical rotation improved by 10 degrees without pain for improved function    Status  On-going    Target Date  01/20/18            Plan - 12/09/17 1309    Clinical Impression Statement  Pt reported significant improvement in symptoms following DN after last session, so DN repeated to upper traps and posterior shoulder muscles.  Pt progressing well reporting centralization of symptoms with 1 LTG met.  Pt's daughter having surgery tomorrow, so additional PT sessions may be limited due to pt's availability, but plan to continue PT 1x/wk x 6 weeks to work towards remaining LTGs.    Rehab Potential  Good    PT Frequency  1x / week    PT Duration  6 weeks    PT Treatment/Interventions  ADLs/Self Care Home  Management;Cryotherapy;Electrical Stimulation;Ultrasound;Traction;Moist Heat;Therapeutic activities;Therapeutic exercise;Patient/family education;Manual techniques;Taping;Dry needling;Passive range of motion    PT Next Visit Plan  possible Lt [place] mobs/ DN or manual therapy, continue traction and progressive spinal stabilization - assess response to DN    PT Home Exercise Plan  Access Code: [email or web address]     Consulted and Agree with Plan of Care  Patient       Patient will benefit from skilled therapeutic intervention in order to improve the following deficits and impairments:  Increased fascial restricitons, Increased muscle spasms, Pain, Postural dysfunction, Decreased strength, Decreased range of motion, Impaired UE functional use  Visit Diagnosis: Cervicalgia - Plan: PT plan of care cert/re-cert  Abnormal posture - Plan: PT plan of care cert/re-cert     Problem List Patient Active Problem List   Diagnosis Date Noted  . Chest pain 12/01/2017  . Encounter for psychological assessment prior to bariatric surgery 07/08/2017  . Psychological factors affecting morbid obesity ([place]) 07/08/2017  . Arthritis 06/18/2017  . Chronic foot pain 06/18/2017  . Family history of colon cancer 12/12/2015  . Plantar fasciitis, bilateral 08/01/2015  . Short interval between pregnancies affecting pregnancy, antepartum 10/01/2013  . Family history of congenital heart defect 07/17/2012  . Ureteral calculus, left 04/12/2011  . 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 Morbid obesity ([place]) 10/01/2010  . HSV 10/03/2006  . ATTENTION DEFICIT, W/HYPERACTIVITY 10/29/2005  . Lupus anticoagulant positive 10/29/2005         Laureen Abrahams, PT, DPT 12/09/17 1:12 PM     [facility] [place] [address] [place], [place], [address] Phone: [phone]   Fax:  [phone]  Name: KATRESE SHELL MRN: [id number] Date of Birth: 1975-05-02     PHYSICAL  THERAPY DISCHARGE SUMMARY  Visits from Start of Care: 5  Current functional level related to goals / functional outcomes: See above   Remaining deficits: See above   Education / Equipment: HEP  Plan: Patient agrees to discharge.  Patient goals were partially met. Patient is being discharged due to the patient's request.  ?????    Laureen Abrahams, PT, DPT 01/19/18 12:49 PM  [facility] [place], [place]  [address]  [phone] (office) [phone] (fax)

## 2017-12-11 ENCOUNTER — Ambulatory Visit (INDEPENDENT_AMBULATORY_CARE_PROVIDER_SITE_OTHER): Payer: BLUE CROSS/BLUE SHIELD | Admitting: Bariatrics

## 2017-12-22 ENCOUNTER — Ambulatory Visit (INDEPENDENT_AMBULATORY_CARE_PROVIDER_SITE_OTHER): Payer: BLUE CROSS/BLUE SHIELD | Admitting: Bariatrics

## 2017-12-22 ENCOUNTER — Encounter (INDEPENDENT_AMBULATORY_CARE_PROVIDER_SITE_OTHER): Payer: Self-pay | Admitting: Bariatrics

## 2017-12-22 VITALS — BP 125/75 | HR 77 | Temp 98.3°F | Ht 66.0 in | Wt 251.0 lb

## 2017-12-22 DIAGNOSIS — Z9189 Other specified personal risk factors, not elsewhere classified: Secondary | ICD-10-CM | POA: Diagnosis not present

## 2017-12-22 DIAGNOSIS — E8881 Metabolic syndrome: Secondary | ICD-10-CM | POA: Diagnosis not present

## 2017-12-22 DIAGNOSIS — Z6841 Body Mass Index (BMI) 40.0 and over, adult: Secondary | ICD-10-CM

## 2017-12-22 DIAGNOSIS — E559 Vitamin D deficiency, unspecified: Secondary | ICD-10-CM

## 2017-12-22 DIAGNOSIS — E88819 Insulin resistance, unspecified: Secondary | ICD-10-CM

## 2017-12-22 MED ORDER — VITAMIN D (ERGOCALCIFEROL) 1.25 MG (50000 UNIT) PO CAPS: 50000.0000 [IU] | ORAL_CAPSULE | ORAL | 0 refills | Status: DC

## 2017-12-24 NOTE — Progress Notes (Signed)
Office: [phone]  /  Fax: [phone]   HPI:   Chief Complaint: OBESITY [name] is here to discuss her progress with her obesity treatment plan. She is on the Category 3 plan and is following her eating plan approximately 20 % of the time. She states she is exercising 0 minutes 0 times per week. [name] has struggled somewhat with the Category 3 plan. She is not getting any breakfast and sometimes lunch; and then she eats a lot at dinner. Her weight is 251 lb (113.9 kg) today and has had a weight gain of 2 pounds over a period of 3 weeks since her last visit. She has gained 1 lb since starting treatment with [place].  Vitamin D deficiency [name] has a diagnosis of vitamin D deficiency. She is currently taking OTC vit D and denies nausea, vomiting or muscle weakness.  At risk for osteopenia and osteoporosis [name] is at higher risk of osteopenia and osteoporosis due to vitamin D deficiency.   Insulin Resistance [name] has a diagnosis of insulin resistance based on her elevated fasting insulin level >5. Her last A1c was at 5.3  Although [name]'s blood glucose readings are still under good control, insulin resistance puts her at greater risk of metabolic syndrome and diabetes. She is not on medications and continues to work on diet and exercise to decrease risk of diabetes.  ALLERGIES: Allergies  Allergen Reactions  . Dairy Aid [Lactase] Other (See Comments)    Excess gas, bloating, diarrhea  . Nickel   . No Known Allergies     MEDICATIONS: Current Outpatient Medications on File Prior to Visit  Medication Sig Dispense Refill  . acetaminophen (TYLENOL 8 HOUR) 650 MG CR tablet Take 1 tablet by mouth as needed.    . diclofenac sodium (VOLTAREN) 1 % GEL Apply topically as needed.    . diphenhydrAMINE HCl (BENADRYL PO) Take 1 tablet by mouth as needed.     . Lactobacillus (PROBIOTIC ACIDOPHILUS) TABS Take by mouth daily as needed.    . magnesium 30 MG tablet Take 30 mg by mouth as  needed.     . meloxicam (MOBIC) 15 MG tablet Take 15 mg by mouth as needed.     . Multiple Vitamin (MULTIVITAMIN) tablet Take 1 tablet by mouth daily.     No current facility-administered medications on file prior to visit.     PAST MEDICAL HISTORY: Past Medical History:  Diagnosis Date  . ACL (anterior cruciate ligament) tear    MCL. LCL Left knee  . ADHD (attention deficit hyperactivity disorder)    husband  . Anemia   . B12 deficiency   . Blood clot in vein   . Chromosomal abnormality   . Congenital abnormality   . Constipation   . Headache   . HSV (herpes simplex virus) anogenital infection   . Joint pain   . Kidney stone on left side   . Lactose intolerance   . Miscarriage    x 8  . 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 Pinched nerve    neck/shoulder  . Plantar fasciitis   . Post partum depression     PAST SURGICAL HISTORY: Past Surgical History:  Procedure Laterality Date  . arm surgery for fracture    . DILATION AND CURETTAGE OF UTERUS  1998  . KNEE SURGERY Left    torn menicus  . TONSILLECTOMY AND ADENOIDECTOMY    . WISDOM TOOTH EXTRACTION      SOCIAL HISTORY:  Social History   Tobacco Use  . Smoking status: Former Smoker    Types: Cigarettes    Last attempt to quit: 01/27/2004    Years since quitting: 13.9  . Smokeless tobacco: Never Used  Substance Use Topics  . Alcohol use: No  . Drug use: No    FAMILY HISTORY: Family History  Adopted: Yes  Problem Relation Age of Onset  . Autism spectrum disorder Daughter   . Other Daughter        Pancreatic insufficiency.   . Colon cancer Mother 22       died at age 18  . Colon polyps Mother   . Colon cancer Maternal Grandmother 43  . Colon cancer Other 89       mat great grand mother  . Lung cancer Father 72       smoker  . Heart attack Maternal Grandfather   . Hypertension Maternal Grandfather   . Diabetes Paternal Grandmother        type 1  . Breast cancer  Maternal Aunt 20  . Ovarian cancer Maternal Aunt   . Other Maternal Aunt        reproductive issues  . Colon cancer Maternal Uncle   . Chiari malformation Sister     ROS: Review of Systems  Constitutional: Negative for weight loss.  Gastrointestinal: Negative for nausea and vomiting.  Musculoskeletal:       Negative for muscle weakness    PHYSICAL EXAM: Blood pressure 125/75, pulse 77, temperature 98.3 F (36.8 C), temperature source Oral, height 5\' 6"  (1.676 m), weight 251 lb (113.9 kg), SpO2 99 %, unknown if currently breastfeeding. Body mass index is 40.51 kg/m. Physical Exam  Constitutional: She is oriented to person, place, and time. She appears well-developed and well-nourished.  Cardiovascular: Normal rate.  Pulmonary/Chest: Effort normal.  Musculoskeletal: Normal range of motion.  Neurological: She is oriented to person, place, and time.  Skin: Skin is warm and dry.  Psychiatric: She has a normal mood and affect. Her behavior is normal.  Vitals reviewed.   RECENT LABS AND TESTS: BMET    Component Value Date/Time   NA 137 11/18/2017 1652   NA 140 11/11/2017 1249   K 3.6 11/18/2017 1652   CL 104 11/18/2017 1652   CO2 25 11/18/2017 1652   GLUCOSE 95 11/18/2017 1652   BUN 22 (H) 11/18/2017 1652   BUN 11 11/11/2017 1249   CREATININE 0.77 11/18/2017 1652   CREATININE 0.71 10/03/2016 1152   CALCIUM 9.4 11/18/2017 1652   GFRNONAA >60 11/18/2017 1652   GFRNONAA 107 10/03/2016 1152   GFRAA >60 11/18/2017 1652   GFRAA 124 10/03/2016 1152   Lab Results  Component Value Date   HGBA1C 5.3 11/11/2017   HGBA1C 5.1 01/19/2010   Lab Results  Component Value Date   INSULIN 8.9 11/11/2017   CBC    Component Value Date/Time   WBC 13.5 (H) 11/18/2017 1652   RBC 4.69 11/18/2017 1652   HGB 12.3 11/18/2017 1652   HGB 11.9 11/11/2017 1249   HCT 38.8 11/18/2017 1652   HCT 36.8 11/11/2017 1249   PLT 412 (H) 11/18/2017 1652   MCV 82.7 11/18/2017 1652   MCV 82  11/11/2017 1249   MCH 26.2 11/18/2017 1652   MCHC 31.7 11/18/2017 1652   RDW 14.7 11/18/2017 1652   RDW 14.5 11/11/2017 1249   LYMPHSABS 2.8 11/11/2017 1249   MONOABS 0.8 01/26/2017 1055   EOSABS 0.2 11/11/2017 1249   BASOSABS 0.1  11/11/2017 1249   Iron/TIBC/Ferritin/ %Sat    Component Value Date/Time   FERRITIN 24 12/08/2015 1418   Lipid Panel     Component Value Date/Time   CHOL 163 11/11/2017 1249   TRIG 128 11/11/2017 1249   HDL 41 11/11/2017 1249   LDLCALC 96 11/11/2017 1249   Hepatic Function Panel     Component Value Date/Time   PROT 7.5 11/11/2017 1249   ALBUMIN 4.3 11/11/2017 1249   AST 12 11/11/2017 1249   ALT 10 11/11/2017 1249   ALKPHOS 71 11/11/2017 1249   BILITOT 0.3 11/11/2017 1249      Component Value Date/Time   TSH 1.300 11/11/2017 1249   TSH 1.13 10/03/2016 1152   TSH 1.12 12/08/2015 1418   Results for Doris MaxinLLIS, Yanis M (MRN [id number]) as of 12/24/2017 17:12  Ref. Range 11/11/2017 12:49  Vitamin D, 25-Hydroxy Latest Ref Range: 30.0 - 100.0 ng/mL 38.8   ASSESSMENT AND PLAN: Vitamin D deficiency - Plan: Vitamin D, Ergocalciferol, (DRISDOL) 1.25 MG (50000 UT) CAPS capsule  Insulin resistance  At risk for osteoporosis  Class 3 severe obesity with serious comorbidity and body mass index (BMI) of 40.0 to 44.9 in adult, unspecified obesity type (HCC)  PLAN:  Vitamin D Deficiency [name] was informed that low vitamin D levels contributes to fatigue and are associated with obesity, breast, and colon cancer. She agrees to continue to take prescription Vit D @50 ,000 IU every week #4 with no refills and will follow up for routine testing of vitamin D, at least 2-3 times per year. She was informed of the risk of over-replacement of vitamin D and agrees to not increase her dose unless she discusses this with us first. [name] agrees to follow up as directed.  At risk for osteopenia and osteoporosis [name] was given extended  (15 minutes) osteoporosis  prevention counseling today. [name] is at risk for osteopenia and osteoporosis due to her vitamin D deficiency. She was encouraged to take her vitamin D and follow her higher calcium diet and increase strengthening exercise to help strengthen her bones and decrease her risk of osteopenia and osteoporosis.  Insulin Resistance [name] will continue to work on weight loss, exercise, increasing protein and decreasing simple carbohydrates in her diet to help decrease the risk of diabetes. She was informed that eating too many simple carbohydrates or too many calories at one sitting increases the likelihood of GI side effects. [name] agreed to follow up with us as directed to monitor her progress.  Obesity [name] is currently in the action stage of change. As such, her goal is to continue with weight loss efforts She has agreed to follow the Category 3 plan [name] has been instructed to work up to a goal of 150 minutes of combined cardio and strengthening exercise per week for weight loss and overall health benefits. We discussed the following Behavioral Modification Strategies today: increase H2O intake, no skipping meals, increasing lean protein intake, decreasing simple carbohydrates , increasing vegetables and decrease eating out [name] will have a shake in the morning and she will increase her protein and will get her protein in through the day (30 grams of protein)  [name] has agreed to follow up with our clinic in 6 weeks. She was informed of the importance of frequent follow up visits to maximize her success with intensive lifestyle modifications for her multiple health conditions.   OBESITY BEHAVIORAL INTERVENTION VISIT  Today's visit was # 3   Starting weight: 250 lbs Starting date: 11/11/2017 Today's  weight : 251 lbs  Today's date: 12/22/2017 Total lbs lost to date: 0   ASK: We discussed the diagnosis of obesity with [name] today and [name] agreed to give [place]  permission to discuss obesity behavioral modification therapy today.  ASSESS: [name] has the diagnosis of obesity and her BMI today is 40.53 [name] is in the action stage of change   ADVISE: [name] was educated on the multiple health risks of obesity as well as the benefit of weight loss to improve her health. She was advised of the need for long term treatment and the importance of lifestyle modifications to improve her current health and to decrease her risk of future health problems.  AGREE: Multiple dietary modification options and treatment options were discussed and  [name] agreed to follow the recommendations documented in the above note.  ARRANGE: [name] was educated on the importance of frequent visits to treat obesity as outlined per CMS and USPSTF guidelines and agreed to schedule her next follow up appointment today.  Cristi Loron, am acting as Energy manager for [employer]. Manson Passey, DO  I have reviewed the above documentation for accuracy and completeness, and I agree with the above. -Corinna Capra, DO

## 2017-12-29 ENCOUNTER — Encounter (INDEPENDENT_AMBULATORY_CARE_PROVIDER_SITE_OTHER): Payer: Self-pay | Admitting: Bariatrics

## 2018-01-08 ENCOUNTER — Encounter (HOSPITAL_BASED_OUTPATIENT_CLINIC_OR_DEPARTMENT_OTHER): Payer: Self-pay

## 2018-01-08 ENCOUNTER — Ambulatory Visit (HOSPITAL_BASED_OUTPATIENT_CLINIC_OR_DEPARTMENT_OTHER): Admit: 2018-01-08 | Payer: Self-pay | Admitting: Orthopaedic Surgery

## 2018-01-08 SURGERY — KNEE ARTHROSCOPY WITH ANTERIOR CRUCIATE LIGAMENT (ACL) REPAIR WITH HAMSTRING GRAFT
Anesthesia: Choice | Laterality: Left

## 2018-01-12 DIAGNOSIS — S83512A Sprain of anterior cruciate ligament of left knee, initial encounter: Secondary | ICD-10-CM | POA: Diagnosis not present

## 2018-01-12 DIAGNOSIS — G8918 Other acute postprocedural pain: Secondary | ICD-10-CM | POA: Diagnosis not present

## 2018-01-12 DIAGNOSIS — Y999 Unspecified external cause status: Secondary | ICD-10-CM | POA: Diagnosis not present

## 2018-01-12 DIAGNOSIS — X58XXXA Exposure to other specified factors, initial encounter: Secondary | ICD-10-CM | POA: Diagnosis not present

## 2018-01-16 DIAGNOSIS — S83512D Sprain of anterior cruciate ligament of left knee, subsequent encounter: Secondary | ICD-10-CM | POA: Diagnosis not present

## 2018-01-20 ENCOUNTER — Other Ambulatory Visit: Payer: Self-pay

## 2018-01-20 ENCOUNTER — Telehealth: Payer: Self-pay | Admitting: Family Medicine

## 2018-01-20 ENCOUNTER — Ambulatory Visit: Payer: BLUE CROSS/BLUE SHIELD | Admitting: Physical Therapy

## 2018-01-20 ENCOUNTER — Encounter: Payer: Self-pay | Admitting: Family Medicine

## 2018-01-20 ENCOUNTER — Encounter: Payer: Self-pay | Admitting: Physical Therapy

## 2018-01-20 DIAGNOSIS — M6281 Muscle weakness (generalized): Secondary | ICD-10-CM

## 2018-01-20 DIAGNOSIS — M25562 Pain in left knee: Secondary | ICD-10-CM

## 2018-01-20 DIAGNOSIS — M25662 Stiffness of left knee, not elsewhere classified: Secondary | ICD-10-CM

## 2018-01-20 DIAGNOSIS — R600 Localized edema: Secondary | ICD-10-CM | POA: Diagnosis not present

## 2018-01-20 DIAGNOSIS — R2689 Other abnormalities of gait and mobility: Secondary | ICD-10-CM

## 2018-01-20 NOTE — Telephone Encounter (Signed)
Can she send a picture through MyChart and I can see if I can remove it or if need to see eye doc.

## 2018-01-20 NOTE — Telephone Encounter (Signed)
Patient was advised to send a picture through MyChart. States she will do this in the next hour or so. No further questions at this time.

## 2018-01-20 NOTE — Patient Instructions (Signed)
Access Code: [email or web address]  URL: [email or web address]  Date: 01/20/2018  Prepared by: Moshe CiproStephanie Torsten Weniger   Exercises  Supine Knee Flexion Wall Slide - 5 min - 3x daily - 7x weekly  Supine Straight Leg Hip Adduction and Quad Set with Ball - 10 reps - 3 sets - 5 sec hold - 3x daily - 7x weekly  Short Arc Quad with Harley-DavidsonBall Squeeze - 10 reps - 3 sets - 3x daily - 7x weekly  Small Range Straight Leg Raise - 10 reps - 3 sets - 3-5 sec hold - 3x daily - 7x weekly  Sidelying Hip Abduction - 3 sets - 10 reps - 3x daily - 7x weekly  Sidelying Hip Adduction - 10 reps - 3 sets - 3x daily - 7x weekly  Prone Hip Extension - 10 reps - 3 sets - 3x daily - 7x weekly

## 2018-01-20 NOTE — Therapy (Signed)
[facility] [address] [place], [place], [address] Phone: [phone]   Fax:  [phone]  Physical Therapy Evaluation  Patient Details  Name: [name] MRN: [id number] Date of Birth: Dec 03, 1975 Referring Provider (PT): Dr. Ramond Marrowax Varkey   Encounter Date: 01/20/2018  PT End of Session - 01/20/18 1305    Visit Number  1    Number of Visits  24    Date for PT Re-Evaluation  04/14/18    PT Start Time  1115    PT Stop Time  1205    PT Time Calculation (min)  50 min    Activity Tolerance  Patient tolerated treatment well;No increased pain    Behavior During Therapy  WFL for tasks assessed/performed       Past Medical History:  Diagnosis Date  . ACL (anterior cruciate ligament) tear    MCL. LCL Left knee  . ADHD (attention deficit hyperactivity disorder)    husband  . Anemia   . B12 deficiency   . Blood clot in vein   . Chromosomal abnormality   . Congenital abnormality   . Constipation   . Headache   . HSV (herpes simplex virus) anogenital infection   . Joint pain   . Kidney stone on left side   . Lactose intolerance   . Miscarriage    x 8  . 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 Pinched nerve    neck/shoulder  . Plantar fasciitis   . Post partum depression     Past Surgical History:  Procedure Laterality Date  . arm surgery for fracture    . DILATION AND CURETTAGE OF UTERUS  1998  . KNEE SURGERY Left    torn menicus  . TONSILLECTOMY AND ADENOIDECTOMY    . WISDOM TOOTH EXTRACTION      There were no vitals filed for this visit.   Subjective Assessment - 01/20/18 1119    Subjective  Pt is a 42 y/o female who presents to OPPT s/p Lt ACL repair with chondroplasty on 01/12/18.     Patient Stated Goals  return to lifting weights    Currently in Pain?  Yes    Pain Score  5     Pain Location  Knee    Pain Orientation  Left    Pain Descriptors / Indicators   Aching;Dull;Constant    Pain Type  Surgical pain    Pain Onset  1 to 4 weeks ago    Pain Frequency  Constant    Aggravating Factors   bending    Pain Relieving Factors  keeping knee in full extension, rest         [facility] - 01/20/18 1122      Assessment   Medical Diagnosis  Lt ACL reconstruction with hamstring graft    Referring Provider (PT)  Dr. Ramond Marrowax Varkey    Onset Date/Surgical Date  01/12/18    Hand Dominance  Right    Next MD Visit  3 weeks    Prior Therapy  none      Precautions   Precautions  None    Required Braces or Orthoses  Knee Immobilizer - Left    Knee Immobilizer - Left  Other (comment)   locked full extension when sleeping     Restrictions   Weight Bearing Restrictions  Yes    LLE Weight Bearing  Weight bearing as tolerated      Balance Screen  Has the patient fallen in the past 6 months  No    Has the patient had a decrease in activity level because of a fear of falling?   No    Is the patient reluctant to leave their home because of a fear of falling?   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    Leisure  gym/weightlifting, cardio      Cognition   Overall Cognitive Status  Within Functional Limits for tasks assessed      Observation/Other Assessments   Observations  swelling present Lt knee    Focus on Therapeutic Outcomes (FOTO)   36 (68% limited; predicted 38% limited)      Posture/Postural Control   Posture/Postural Control  Postural limitations    Postural Limitations  Rounded Shoulders;Forward head      ROM / Strength   AROM / PROM / Strength   AROM;PROM;Strength      AROM   AROM Assessment Site  Knee    Right/Left Knee  Right;Left    Right Knee Extension  0    Right Knee Flexion  126    Left Knee Extension  -3    Left Knee Flexion  78      PROM   PROM Assessment Site  Knee    Right/Left Knee  Left    Left Knee Extension  0    Left Knee Flexion  85      Strength   Overall Strength Comments  deferred knee testing at this time; poor VMO activation    Strength Assessment Site  Hip;Knee;Ankle    Right/Left Hip  Right;Left    Right Hip Flexion  5/5    Left Hip Flexion  3+/5    Left Hip Extension  4/5    Left Hip ABduction  4/5    Left Hip ADduction  3+/5    Right/Left Knee  --    Right/Left Ankle  Right;Left    Right Ankle Dorsiflexion  5/5    Left Ankle Dorsiflexion  4/5      Ambulation/Gait   Gait Pattern  Decreased stance time - left;Decreased step length - right;Decreased hip/knee flexion - left;Antalgic                Objective measurements completed on examination: See above findings.      [facility] Adult PT Treatment/Exercise - 01/20/18 1122      Self-Care   Self-Care  Other Self-Care Comments    Other Self-Care Comments   educated on wall slides for knee flexion and bike for ROM, seated knee extension stretch with heel prop      Exercises   Exercises  Knee/Hip      Knee/Hip Exercises: Supine   Quad Sets  Left;10 reps    Quad Sets Limitations  with ball squeeze    Short Arc Quad Sets  Left;10 reps    Short Arc Quad Sets Limitations  supine; with ball squeeze  Heel Slides  Left;5 reps    Heel Slides Limitations  with strap    Straight Leg Raises  Left;10 reps      Knee/Hip Exercises: Sidelying   Hip ABduction  Left;10 reps    Hip ADduction  Left;10 reps      Knee/Hip Exercises: Prone   Hip Extension  Left;10 reps      Modalities   Modalities  Vasopneumatic      Vasopneumatic   Number Minutes Vasopneumatic   10 minutes    Vasopnuematic Location   Knee    Vasopneumatic Pressure   Medium    Vasopneumatic Temperature   34             PT Education - 01/20/18 1304    Education Details  HEP, see self care    Person(s) Educated  Patient    Methods  Explanation;Demonstration;Handout    Comprehension  Verbalized understanding;Returned demonstration       PT Short Term Goals - 01/20/18 1311      PT SHORT TERM GOAL #1   Title  independent with initial HEP    Status  New    Target Date  03/03/18      PT SHORT TERM GOAL #2   Title  improve AROM Lt knee 0-120 for improved mobility    Status  New    Target Date  03/03/18      PT SHORT TERM GOAL #3   Title  amb without deviations for improved function    Status  New    Target Date  03/03/18      PT SHORT TERM GOAL #4   Title  report pain < 3/10 with activity for improved function    Status  New    Target Date  03/03/18        PT Long Term Goals - 01/20/18 1313      PT LONG TERM GOAL #1   Title  independent with advanced HEP    Status  New    Target Date  04/14/18      PT LONG TERM GOAL #2   Title  improve FOTO score to </= 38% limited for improved function    Status  New    Target Date  04/14/18      PT LONG TERM GOAL #3   Title  Lt knee AROM 0-140 for improved function and mobility    Status  New    Target Date  04/14/18      PT LONG TERM GOAL #4   Title  demonstrate improved LLE strength of 5/5 for improved function and return to regular exercise    Status  New    Target Date  04/14/18             Plan - 01/20/18 1306    Clinical Impression Statement  Pt is a 42 y/o female who presents to OPPT s/p Lt ACL repair on 01/12/18 with hamstring grafting.  Pt demonstrates decreased ROM and strength with mild gait abnormalities.  Pt will benefit from PT to address deficits listed and return to PLOF.    Clinical Presentation  Stable    Clinical Decision Making  Low    Rehab Potential  Good    PT Frequency  2x / week    PT Duration  12 weeks    PT Treatment/Interventions  ADLs/Self  Care Home Management;Cryotherapy;Electrical Stimulation;Ultrasound;Traction;Moist Heat;Therapeutic activities;Therapeutic exercise;Patient/family education;Manual techniques;Taping;Dry needling;Passive range of motion    PT Next Visit Plan  review HEP, continue A/AAROM and gentle strengthening, try closed chain activities within protocol    PT Home Exercise Plan  Access Code: [email or web address]    Consulted and Agree with Plan of Care  Patient       Patient will benefit from skilled therapeutic intervention in order to improve the following deficits and impairments:  Abnormal gait, Increased muscle spasms, Pain, Decreased range of motion, Decreased strength, Impaired flexibility, Increased edema  Visit Diagnosis: Acute pain of left knee - Plan: PT plan of care cert/re-cert  Stiffness of left knee, not elsewhere classified - Plan: PT plan of care cert/re-cert  Localized edema - Plan: PT plan of care cert/re-cert  Muscle weakness (generalized) - Plan: PT plan of care cert/re-cert  Other abnormalities of gait and mobility - Plan: PT plan of care cert/re-cert     Problem List Patient Active Problem List   Diagnosis Date Noted  . Chest pain 12/01/2017  . Encounter for psychological assessment prior to bariatric surgery 07/08/2017  . Psychological factors affecting morbid obesity (HCC) 07/08/2017  . Arthritis 06/18/2017  . Chronic foot pain 06/18/2017  . Family history of colon cancer 12/12/2015  . Plantar fasciitis, bilateral 08/01/2015  . Short interval between pregnancies affecting pregnancy, antepartum 10/01/2013  . Family history of congenital heart defect 07/17/2012  . Ureteral calculus, left 04/12/2011  . 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 Morbid obesity (HCC) 10/01/2010  . HSV 10/03/2006  . ATTENTION DEFICIT, W/HYPERACTIVITY 10/29/2005  . Lupus anticoagulant positive 10/29/2005      Clarita Crane, PT, DPT 01/20/18 1:17 PM    [facility] [address] [place], [place], [address] Phone: [phone]   Fax:  [phone]  Name: [name] MRN: [id number] Date of Birth: 10-09-1975

## 2018-01-20 NOTE — Telephone Encounter (Signed)
Patient states that she has a possible skin tag growing on her right eye. It has increased and hurting her vision. Would like to know if she needs to come in with Dr.Metheney to have it removed or if she needs to be sent to a dermatologist. If she needs to be referred out she would like to know the best dermatologist. Please advise.

## 2018-01-23 ENCOUNTER — Ambulatory Visit: Payer: BLUE CROSS/BLUE SHIELD | Admitting: Family Medicine

## 2018-01-23 ENCOUNTER — Encounter: Payer: Self-pay | Admitting: Family Medicine

## 2018-01-23 VITALS — BP 122/62 | HR 85 | Ht 66.0 in | Wt 257.0 lb

## 2018-01-23 DIAGNOSIS — L820 Inflamed seborrheic keratosis: Secondary | ICD-10-CM | POA: Diagnosis not present

## 2018-01-23 DIAGNOSIS — H029 Unspecified disorder of eyelid: Secondary | ICD-10-CM | POA: Diagnosis not present

## 2018-01-23 NOTE — Progress Notes (Signed)
   Subjective:    Patient ID: [name], female    DOB: February 22, 1975, 43 y.o.   MRN: [id number]  HPI 43 -year-old female comes in today to have a skin tag removed underneath her right eye.  She says it is been getting a little larger pretty quickly over the last 6 weeks.  It has an almost "hornlike" feature. She says the blackened tip came off yesterday.    Review of Systems     Objective:   Physical Exam Vitals signs reviewed.  Constitutional:      Appearance: She is well-developed.  HENT:     Head: Normocephalic and atraumatic.  Eyes:     Conjunctiva/sclera: Conjunctivae normal.  Cardiovascular:     Rate and Rhythm: Normal rate.  Pulmonary:     Effort: Pulmonary effort is normal.  Skin:    General: Skin is dry.     Coloration: Skin is not pale.     Comments: She has a hornlike small 1 to 2 mm lesion in the right eye just on the lower lid.  The tip is darkened.  Neurological:     Mental Status: She is alert and oriented to person, place, and time.  Psychiatric:        Behavior: Behavior normal.        Assessment & Plan:  Skin lesion on face-skin tag versus seborrheic keratosis  Vs wart -patient preferred biopsy of the lesion.  Iris scissors used to remove the lesion and will send to pathology.  Pressure held until the area quit bleeding.  Recommend just wash gently with soap and water pat dry.  Apply a small amount of Vaseline or coconut oil daily.    Skin Tag Removal Procedure Note  Pre-operative Diagnosis: Classic skin tags (acrochordon)  Post-operative Diagnosis: Classic skin tags (acrochordon)  Locations:right lower eyelid.   Indications: quickly growing  Anesthesia: not required    Procedure Details  The risks (including bleeding and infection) and benefits of the procedure and Verbal informed consent obtained. Using sterile iris scissors, multiple skin tags were snipped off at their bases after cleansing with Betadine.  Bleeding was controlled by  pressure.   Findings: Likely  benign but because of rapid growth will send to pathology.  Condition: Stable  Complications: none.  Plan: 1. Instructed to keep the wounds dry and covered for 24-48h and clean thereafter. 2. Warning signs of infection were reviewed.   3. Recommended that the patient use OTC acetaminophen as needed for pain.  4. Return as needed.

## 2018-01-28 ENCOUNTER — Encounter: Payer: Self-pay | Admitting: Physical Therapy

## 2018-01-28 ENCOUNTER — Ambulatory Visit: Payer: BLUE CROSS/BLUE SHIELD | Admitting: Physical Therapy

## 2018-01-28 DIAGNOSIS — M25562 Pain in left knee: Secondary | ICD-10-CM | POA: Diagnosis not present

## 2018-01-28 DIAGNOSIS — R600 Localized edema: Secondary | ICD-10-CM | POA: Diagnosis not present

## 2018-01-28 DIAGNOSIS — M6281 Muscle weakness (generalized): Secondary | ICD-10-CM

## 2018-01-28 DIAGNOSIS — R2689 Other abnormalities of gait and mobility: Secondary | ICD-10-CM

## 2018-01-28 DIAGNOSIS — M25662 Stiffness of left knee, not elsewhere classified: Secondary | ICD-10-CM | POA: Diagnosis not present

## 2018-01-28 NOTE — Therapy (Signed)
[facility] [place] [address] [place], [place], [address] Phone: [phone]   Fax:  [phone]  Physical Therapy Treatment  Patient Details  Name: [name] MRN: [id number] Date of Birth: 05-13-1975 Referring Provider (PT): Dr. Ramond Marrow   Encounter Date: 01/28/2018  PT End of Session - 01/28/18 1109    Visit Number  2    Number of Visits  24    Date for PT Re-Evaluation  04/14/18    PT Start Time  1031    PT Stop Time  1125    PT Time Calculation (min)  54 min    Activity Tolerance  Patient tolerated treatment well;No increased pain    Behavior During Therapy  WFL for tasks assessed/performed       Past Medical History:  Diagnosis Date  . ACL (anterior cruciate ligament) tear    MCL. LCL Left knee  . ADHD (attention deficit hyperactivity disorder)    husband  . Anemia   . B12 deficiency   . Blood clot in vein   . Chromosomal abnormality   . Congenital abnormality   . Constipation   . Headache   . HSV (herpes simplex virus) anogenital infection   . Joint pain   . Kidney stone on left side   . Lactose intolerance   . Miscarriage    x 8  . 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 Pinched nerve    neck/shoulder  . Plantar fasciitis   . Post partum depression     Past Surgical History:  Procedure Laterality Date  . arm surgery for fracture    . DILATION AND CURETTAGE OF UTERUS  1998  . KNEE SURGERY Left    torn menicus  . TONSILLECTOMY AND ADENOIDECTOMY    . WISDOM TOOTH EXTRACTION      There were no vitals filed for this visit.  Subjective Assessment - 01/28/18 1034    Subjective  doing well; occasional bouts of pain but overall pain minimal.    Patient Stated Goals  return to lifting weights    Currently in Pain?  Yes    Pain Score  3     Pain Location  Knee    Pain Orientation  Left    Pain Descriptors / Indicators  Aching;Dull   "like a bee sting"   Pain  Type  Surgical pain    Pain Onset  1 to 4 weeks ago    Pain Frequency  Constant    Aggravating Factors   bending    Pain Relieving Factors  keeping knee in full extension, rest         [facility] PT Assessment - 01/28/18 1109      Assessment   Medical Diagnosis  Lt ACL reconstruction with hamstring graft    Referring Provider (PT)  Dr. Ramond Marrow    Onset Date/Surgical Date  01/12/18      AROM   Left Knee Flexion  113                   [facility] Adult PT Treatment/Exercise - 01/28/18 1036      Knee/Hip Exercises: Stretches   Gastroc Stretch  Left;3 reps;30 seconds    Other Knee/Hip Stretches  standing knee flexion on 2nd 4" step 5x 10 sec; standing hamstring stretch on 2nd 4" step 5x10 sec      Knee/Hip Exercises: Aerobic   Recumbent Bike  no resistance x 8 min  Knee/Hip Exercises: Standing   Heel Raises  Both;20 reps    Hip Abduction  Both;10 reps;Knee straight    Hip Extension  Both;10 reps;Knee straight    Forward Step Up  Left;10 reps;Hand Hold: 1;Step Height: 4"      Knee/Hip Exercises: Supine   Short Arc Quad Sets  Left;10 reps    Short Arc Quad Sets Limitations  supine; with ball squeeze    Bridges  Both;10 reps      Vasopneumatic   Number Minutes Vasopneumatic   15 minutes    Vasopnuematic Location   Knee    Vasopneumatic Pressure  Medium    Vasopneumatic Temperature   34               PT Short Term Goals - 01/20/18 1311      PT SHORT TERM GOAL #1   Title  independent with initial HEP    Status  New    Target Date  03/03/18      PT SHORT TERM GOAL #2   Title  improve AROM Lt knee 0-120 for improved mobility    Status  New    Target Date  03/03/18      PT SHORT TERM GOAL #3   Title  amb without deviations for improved function    Status  New    Target Date  03/03/18      PT SHORT TERM GOAL #4   Title  report pain < 3/10 with activity for improved function    Status  New    Target Date  03/03/18        PT Long Term Goals -  01/20/18 1313      PT LONG TERM GOAL #1   Title  independent with advanced HEP    Status  New    Target Date  04/14/18      PT LONG TERM GOAL #2   Title  improve FOTO score to </= 38% limited for improved function    Status  New    Target Date  04/14/18      PT LONG TERM GOAL #3   Title  Lt knee AROM 0-140 for improved function and mobility    Status  New    Target Date  04/14/18      PT LONG TERM GOAL #4   Title  demonstrate improved LLE strength of 5/5 for improved function and return to regular exercise    Status  New    Target Date  04/14/18            Plan - 01/28/18 1110    Clinical Impression Statement  Pt with great improvement in flexion today and progressing well with PT.  Still has difficulty with quad activation, improved in closed chain activities.  Pt with some pain with closed chain activities, but tolerates well.  Will continue to benefit from PT to maximize function.    Rehab Potential  Good    PT Frequency  2x / week    PT Duration  12 weeks    PT Treatment/Interventions  ADLs/Self Care Home Management;Cryotherapy;Electrical Stimulation;Ultrasound;Traction;Moist Heat;Therapeutic activities;Therapeutic exercise;Patient/family education;Manual techniques;Taping;Dry needling;Passive range of motion    PT Next Visit Plan  review HEP, continue A/AAROM and gentle strengthening, try closed chain activities within protocol    PT Home Exercise Plan  Access Code: [email or web address]    Consulted and Agree with Plan of Care  Patient       Patient will benefit from skilled therapeutic  intervention in order to improve the following deficits and impairments:  Abnormal gait, Increased muscle spasms, Pain, Decreased range of motion, Decreased strength, Impaired flexibility, Increased edema  Visit Diagnosis: Acute pain of left knee  Stiffness of left knee, not elsewhere classified  Localized edema  Muscle weakness (generalized)  Other abnormalities of gait and  mobility     Problem List Patient Active Problem List   Diagnosis Date Noted  . Chest pain 12/01/2017  . Psychological factors affecting morbid obesity (HCC) 07/08/2017  . Arthritis 06/18/2017  . Chronic foot pain 06/18/2017  . Family history of colon cancer 12/12/2015  . Plantar fasciitis, bilateral 08/01/2015  . Short interval between pregnancies affecting pregnancy, antepartum 10/01/2013  . Family history of congenital heart defect 07/17/2012  . Ureteral calculus, left 04/12/2011  . 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 Morbid obesity (HCC) 10/01/2010  . HSV 10/03/2006  . ATTENTION DEFICIT, W/HYPERACTIVITY 10/29/2005  . Lupus anticoagulant positive 10/29/2005      Clarita CraneStephanie F Carianne Taira, PT, DPT 01/28/18 11:11 AM     [facility] [address] [place], [place], [address] Phone: [phone]   Fax:  [phone]  Name: Doris MaxinHeather M Sumida MRN: [id number] Date of Birth: Oct 04, 1975

## 2018-01-30 ENCOUNTER — Encounter: Payer: BLUE CROSS/BLUE SHIELD | Admitting: Physical Therapy

## 2018-02-02 ENCOUNTER — Encounter (INDEPENDENT_AMBULATORY_CARE_PROVIDER_SITE_OTHER): Payer: Self-pay | Admitting: Bariatrics

## 2018-02-02 ENCOUNTER — Ambulatory Visit (INDEPENDENT_AMBULATORY_CARE_PROVIDER_SITE_OTHER): Payer: BLUE CROSS/BLUE SHIELD | Admitting: Bariatrics

## 2018-02-02 VITALS — BP 118/77 | HR 72 | Temp 98.3°F | Ht 66.0 in | Wt 253.0 lb

## 2018-02-02 DIAGNOSIS — Z6841 Body Mass Index (BMI) 40.0 and over, adult: Secondary | ICD-10-CM

## 2018-02-02 DIAGNOSIS — E8881 Metabolic syndrome: Secondary | ICD-10-CM

## 2018-02-03 NOTE — Progress Notes (Signed)
Office: [phone]  /  Fax: [phone]   HPI:   Chief Complaint: OBESITY [name] is here to discuss her progress with her obesity treatment plan. She is on the Category 3 plan and is following her eating plan approximately 0 % of the time. She states she is exercising 0 minutes 0 times per week. [name] had ACL surgery and she has not been following the diet as closely. She is doing well, but she is going to physical therapy (uses mobilizer at night). She is not sleeping at night. Her weight is 253 lb (114.8 kg) today and has had a weight gain of 2 pounds over a period of 6 weeks since her last visit. She has gained 3 lbs since starting treatment with [place].  Insulin Resistance [name] has a diagnosis of insulin resistance based on her elevated fasting insulin level >5. Although [name]'s blood glucose readings are still under good control, insulin resistance puts her at greater risk of metabolic syndrome and diabetes. She is not taking medications currently and continues to work on diet and exercise to decrease risk of diabetes. [name] denies polyphagia.  ASSESSMENT AND PLAN:  Insulin resistance  Class 3 severe obesity with serious comorbidity and body mass index (BMI) of 40.0 to 44.9 in adult, unspecified obesity type (HCC)  PLAN:  Insulin Resistance [name] will continue to work on weight loss, exercise, increasing lean protein and decreasing simple carbohydrates in her diet to help decrease the risk of diabetes. She was informed that eating too many simple carbohydrates or too many calories at one sitting increases the likelihood of GI side effects. [name] will start journaling and will work on meal planning. She agreed to follow up with [place] as directed to monitor her progress.  I spent > than 50% of the 15 minute visit on counseling as documented in the note.  Obesity [name] is currently in the action stage of change. As such, her goal is to continue with weight loss  efforts She has agreed to keep a food journal with 450 to 600 calories and 40+ grams of protein at supper daily and follow the Category 3 plan [name] has been instructed to work up to a goal of 150 minutes of combined cardio and strengthening exercise per week for weight loss and overall health benefits. We discussed the following Behavioral Modification Strategies today: increase H2O intake, keeping healthy foods in the home, increasing lean protein intake, decreasing simple carbohydrates, increasing vegetables and work on meal planning and easy cooking plans [name] will journal every meal, so that she can eat the same as her children.  [name] has agreed to follow up with our clinic in 2 weeks. She was informed of the importance of frequent follow up visits to maximize her success with intensive lifestyle modifications for her multiple health conditions.  ALLERGIES: Allergies  Allergen Reactions  . Dairy Aid [Lactase] Other (See Comments)    Excess gas, bloating, diarrhea  . Nickel   . No Known Allergies     MEDICATIONS: Current Outpatient Medications on File Prior to Visit  Medication Sig Dispense Refill  . acetaminophen (TYLENOL 8 HOUR) 650 MG CR tablet Take 1 tablet by mouth as needed.    . meloxicam (MOBIC) 15 MG tablet Take 7.5 mg by mouth as needed.     . Multiple Vitamin (MULTIVITAMIN) tablet Take 1 tablet by mouth daily.     No current facility-administered medications on file prior to visit.     PAST MEDICAL HISTORY: Past Medical  History:  Diagnosis Date  . ACL (anterior cruciate ligament) tear    MCL. LCL Left knee  . ADHD (attention deficit hyperactivity disorder)    husband  . Anemia   . B12 deficiency   . Blood clot in vein   . Chromosomal abnormality   . Congenital abnormality   . Constipation   . Headache   . HSV (herpes simplex virus) anogenital infection   . Joint pain   . Kidney stone on left side   . Lactose intolerance   . Miscarriage    x 8  .  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 Pinched nerve    neck/shoulder  . Plantar fasciitis   . Post partum depression     PAST SURGICAL HISTORY: Past Surgical History:  Procedure Laterality Date  . arm surgery for fracture    . DILATION AND CURETTAGE OF UTERUS  1998  . KNEE SURGERY Left    torn menicus  . TONSILLECTOMY AND ADENOIDECTOMY    . WISDOM TOOTH EXTRACTION      SOCIAL HISTORY: Social History   Tobacco Use  . Smoking status: Former Smoker    Types: Cigarettes    Last attempt to quit: 01/27/2004    Years since quitting: 14.0  . Smokeless tobacco: Never Used  Substance Use Topics  . Alcohol use: No  . Drug use: No    FAMILY HISTORY: Family History  Adopted: Yes  Problem Relation Age of Onset  . Autism spectrum disorder Daughter   . Other Daughter        Pancreatic insufficiency.   . Colon cancer Mother [id number]       died at age 41  . Colon polyps Mother   . Colon cancer Maternal Grandmother [id number]  . Colon cancer Other 89       mat great grand mother  . Lung cancer Father 7655       smoker  . Heart attack Maternal Grandfather   . Hypertension Maternal Grandfather   . Diabetes Paternal Grandmother        type 1  . Breast cancer Maternal Aunt 20  . Ovarian cancer Maternal Aunt   . Other Maternal Aunt        reproductive issues  . Colon cancer Maternal Uncle   . Chiari malformation Sister     ROS: Review of Systems  Constitutional: Negative for weight loss.  Endo/Heme/Allergies:       Negative for polyphagia  Psychiatric/Behavioral: The patient has insomnia.     PHYSICAL EXAM: Blood pressure 118/77, pulse 72, temperature 98.3 F (36.8 C), temperature source Oral, height 5\' 6"  (1.676 m), weight 253 lb (114.8 kg), SpO2 99 %, unknown if currently breastfeeding. Body mass index is 40.84 kg/m. Physical Exam Vitals signs reviewed.  Constitutional:      Appearance: Normal appearance. She is well-developed. She is obese.   Cardiovascular:     Rate and Rhythm: Normal rate.  Pulmonary:     Effort: Pulmonary effort is normal.  Musculoskeletal: Normal range of motion.  Skin:    General: Skin is warm and dry.  Neurological:     Mental Status: She is alert and oriented to person, place, and time.  Psychiatric:        Mood and Affect: Mood normal.        Behavior: Behavior normal.     RECENT LABS AND TESTS: BMET    Component Value Date/Time   NA 137 11/18/2017 1652  NA 140 11/11/2017 1249   K 3.6 11/18/2017 1652   CL 104 11/18/2017 1652   CO2 25 11/18/2017 1652   GLUCOSE 95 11/18/2017 1652   BUN 22 (H) 11/18/2017 1652   BUN 11 11/11/2017 1249   CREATININE 0.77 11/18/2017 1652   CREATININE 0.71 10/03/2016 1152   CALCIUM 9.4 11/18/2017 1652   GFRNONAA >60 11/18/2017 1652   GFRNONAA 107 10/03/2016 1152   GFRAA >60 11/18/2017 1652   GFRAA 124 10/03/2016 1152   Lab Results  Component Value Date   HGBA1C 5.3 11/11/2017   HGBA1C 5.1 01/19/2010   Lab Results  Component Value Date   INSULIN 8.9 11/11/2017   CBC    Component Value Date/Time   WBC 13.5 (H) 11/18/2017 1652   RBC 4.69 11/18/2017 1652   HGB 12.3 11/18/2017 1652   HGB 11.9 11/11/2017 1249   HCT 38.8 11/18/2017 1652   HCT 36.8 11/11/2017 1249   PLT 412 (H) 11/18/2017 1652   MCV 82.7 11/18/2017 1652   MCV 82 11/11/2017 1249   MCH 26.2 11/18/2017 1652   MCHC 31.7 11/18/2017 1652   RDW 14.7 11/18/2017 1652   RDW 14.5 11/11/2017 1249   LYMPHSABS 2.8 11/11/2017 1249   MONOABS 0.8 01/26/2017 1055   EOSABS 0.2 11/11/2017 1249   BASOSABS 0.1 11/11/2017 1249   Iron/TIBC/Ferritin/ %Sat    Component Value Date/Time   FERRITIN 24 12/08/2015 1418   Lipid Panel     Component Value Date/Time   CHOL 163 11/11/2017 1249   TRIG 128 11/11/2017 1249   HDL 41 11/11/2017 1249   LDLCALC 96 11/11/2017 1249   Hepatic Function Panel     Component Value Date/Time   PROT 7.5 11/11/2017 1249   ALBUMIN 4.3 11/11/2017 1249   AST 12  11/11/2017 1249   ALT 10 11/11/2017 1249   ALKPHOS 71 11/11/2017 1249   BILITOT 0.3 11/11/2017 1249      Component Value Date/Time   TSH 1.300 11/11/2017 1249   TSH 1.13 10/03/2016 1152   TSH 1.12 12/08/2015 1418     Ref. Range 11/11/2017 12:49  Vitamin D, 25-Hydroxy Latest Ref Range: 30.0 - 100.0 ng/mL 38.8     OBESITY BEHAVIORAL INTERVENTION VISIT  Today's visit was # 4   Starting weight: 250 lbs Starting date: 11/11/2017 Today's weight : 253 lbs  Today's date: 02/02/2018 Total lbs lost to date: 0   ASK: We discussed the diagnosis of obesity with Duwaine Maxin today and Herbert Seta agreed to give [place] permission to discuss obesity behavioral modification therapy today.  ASSESS: [place] has the diagnosis of obesity and her BMI today is 40.85 Laqwanda is in the action stage of change   ADVISE: Cameren was educated on the multiple health risks of obesity as well as the benefit of weight loss to improve her health. She was advised of the need for long term treatment and the importance of lifestyle modifications to improve her current health and to decrease her risk of future health problems.  AGREE: Multiple dietary modification options and treatment options were discussed and  Yael agreed to follow the recommendations documented in the above note.  ARRANGE: Sumara was educated on the importance of frequent visits to treat obesity as outlined per CMS and USPSTF guidelines and agreed to schedule her next follow up appointment today.  Cristi Loron, am acting as Energy manager for [employer]. Manson Passey, DO  I have reviewed the above documentation for accuracy and completeness, and I agree with the  above. Corinna Capra-Tandrea Kommer, DO  I have reviewed the above documentation for accuracy and completeness, and I agree with the above. -Corinna CapraAngel Malissa Slay, DO

## 2018-02-04 DIAGNOSIS — E8881 Metabolic syndrome: Secondary | ICD-10-CM | POA: Insufficient documentation

## 2018-02-04 DIAGNOSIS — Z6841 Body Mass Index (BMI) 40.0 and over, adult: Secondary | ICD-10-CM

## 2018-02-05 ENCOUNTER — Other Ambulatory Visit (INDEPENDENT_AMBULATORY_CARE_PROVIDER_SITE_OTHER): Payer: Self-pay | Admitting: Bariatrics

## 2018-02-05 DIAGNOSIS — E559 Vitamin D deficiency, unspecified: Secondary | ICD-10-CM

## 2018-02-06 ENCOUNTER — Ambulatory Visit: Payer: BLUE CROSS/BLUE SHIELD | Admitting: Physical Therapy

## 2018-02-06 ENCOUNTER — Encounter: Payer: Self-pay | Admitting: Physical Therapy

## 2018-02-06 DIAGNOSIS — M6281 Muscle weakness (generalized): Secondary | ICD-10-CM

## 2018-02-06 DIAGNOSIS — M25662 Stiffness of left knee, not elsewhere classified: Secondary | ICD-10-CM

## 2018-02-06 DIAGNOSIS — M25562 Pain in left knee: Secondary | ICD-10-CM

## 2018-02-06 DIAGNOSIS — R600 Localized edema: Secondary | ICD-10-CM

## 2018-02-06 DIAGNOSIS — R2689 Other abnormalities of gait and mobility: Secondary | ICD-10-CM

## 2018-02-06 NOTE — Therapy (Signed)
[facility] [place] [address] [place], [place], [address] Phone: [phone]   Fax:  [phone]  Physical Therapy Treatment  Patient Details  Name: [name] MRN: [id number] Date of Birth: 19-Sep-1975 Referring Provider (PT): Dr. Ramond Marrow   Encounter Date: 02/06/2018  PT End of Session - 02/06/18 1017    Visit Number  3    Number of Visits  24    Date for PT Re-Evaluation  04/14/18    PT Start Time  0931    PT Stop Time  1010    PT Time Calculation (min)  39 min    Activity Tolerance  Patient tolerated treatment well;No increased pain    Behavior During Therapy  WFL for tasks assessed/performed       Past Medical History:  Diagnosis Date  . ACL (anterior cruciate ligament) tear    MCL. LCL Left knee  . ADHD (attention deficit hyperactivity disorder)    husband  . Anemia   . B12 deficiency   . Blood clot in vein   . Chromosomal abnormality   . Congenital abnormality   . Constipation   . Headache   . HSV (herpes simplex virus) anogenital infection   . Joint pain   . Kidney stone on left side   . Lactose intolerance   . Miscarriage    x 8  . 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 Pinched nerve    neck/shoulder  . Plantar fasciitis   . Post partum depression     Past Surgical History:  Procedure Laterality Date  . arm surgery for fracture    . DILATION AND CURETTAGE OF UTERUS  1998  . KNEE SURGERY Left    torn menicus  . TONSILLECTOMY AND ADENOIDECTOMY    . WISDOM TOOTH EXTRACTION      There were no vitals filed for this visit.  Subjective Assessment - 02/06/18 0933    Subjective  having some increased pain; but has returned to normal activity so thinks that may be contributing to pain.    Patient Stated Goals  return to lifting weights    Currently in Pain?  Yes    Pain Score  4     Pain Location  Knee    Pain Orientation  Left    Pain Descriptors /  Indicators  Aching;Dull    Pain Type  Surgical pain    Pain Onset  1 to 4 weeks ago    Pain Frequency  Constant    Aggravating Factors   bending    Pain Relieving Factors  keeping knee in full extension, rest         [facility] PT Assessment - 02/06/18 0957      Assessment   Medical Diagnosis  Lt ACL reconstruction with hamstring graft    Referring Provider (PT)  Dr. Ramond Marrow    Onset Date/Surgical Date  01/12/18      AROM   Left Knee Extension  -1    Left Knee Flexion  115      PROM   Left Knee Extension  0    Left Knee Flexion  120                   [facility] Adult PT Treatment/Exercise - 02/06/18 0939      Knee/Hip Exercises: Stretches   Quad Stretch  Left;3 reps;30 seconds      Knee/Hip Exercises: Aerobic   Nustep  L5 x 8 min; LEs only      Knee/Hip Exercises: Machines for Strengthening   Total Gym Leg Press  2 plates [id number]; LLE only      Knee/Hip Exercises: Supine   Quad Sets  Left;15 reps    Quad Sets Limitations  mod cues for quad activation    Short Arc [employer]Quad Sets  Left;15 reps    Short Arc Quad Sets Limitations  5 sec hold    Straight Leg Raise with External Rotation  Left;15 reps    Straight Leg Raise with External Rotation Limitations  3-5 sec hold      Knee/Hip Exercises: Sidelying   Hip ADduction  Left;15 reps      Modalities   Modalities  --   pt declined              PT Short Term Goals - 01/20/18 1311      PT SHORT TERM GOAL #1   Title  independent with initial HEP    Status  New    Target Date  03/03/18      PT SHORT TERM GOAL #2   Title  improve AROM Lt knee 0-120 for improved mobility    Status  New    Target Date  03/03/18      PT SHORT TERM GOAL #3   Title  amb without deviations for improved function    Status  New    Target Date  03/03/18      PT SHORT TERM GOAL #4   Title  report pain < 3/10 with activity for improved function    Status  New    Target Date  03/03/18        PT Long Term Goals - 01/20/18  1313      PT LONG TERM GOAL #1   Title  independent with advanced HEP    Status  New    Target Date  04/14/18      PT LONG TERM GOAL #2   Title  improve FOTO score to </= 38% limited for improved function    Status  New    Target Date  04/14/18      PT LONG TERM GOAL #3   Title  Lt knee AROM 0-140 for improved function and mobility    Status  New    Target Date  04/14/18      PT LONG TERM GOAL #4   Title  demonstrate improved LLE strength of 5/5 for improved function and return to regular exercise    Status  New    Target Date  04/14/18            Plan - 02/06/18 1017    Clinical Impression Statement  Pt with slight improvements in ROM at this time, and continues to demonstrate poor VMO activation which improves with cues and visual feedback.  Pt limited by pain and other musculoskeletal limitations with exercises performed.  Will continue to benefit from PT to maximize function.    Rehab Potential  Good    PT Frequency  2x / week    PT Duration  12 weeks    PT Treatment/Interventions  ADLs/Self Care Home Management;Cryotherapy;Electrical Stimulation;Ultrasound;Traction;Moist Heat;Therapeutic activities;Therapeutic exercise;Patient/family education;Manual techniques;Taping;Dry needling;Passive range of motion    PT Next Visit Plan  review HEP, continue A/AAROM and gentle strengthening, try closed chain activities within protocol    PT Home Exercise Plan  Access Code: [email or web address]    Consulted and Agree with Plan of  Care  Patient       Patient will benefit from skilled therapeutic intervention in order to improve the following deficits and impairments:  Abnormal gait, Increased muscle spasms, Pain, Decreased range of motion, Decreased strength, Impaired flexibility, Increased edema  Visit Diagnosis: Acute pain of left knee  Stiffness of left knee, not elsewhere classified  Localized edema  Muscle weakness (generalized)  Other abnormalities of gait and  mobility     Problem List Patient Active Problem List   Diagnosis Date Noted  . Insulin resistance 02/04/2018  . Class 3 severe obesity with serious comorbidity and body mass index (BMI) of 40.0 to 44.9 in adult (HCC) 02/04/2018  . Chest pain 12/01/2017  . Psychological factors affecting morbid obesity (HCC) 07/08/2017  . Arthritis 06/18/2017  . Chronic foot pain 06/18/2017  . Family history of colon cancer 12/12/2015  . Plantar fasciitis, bilateral 08/01/2015  . Short interval between pregnancies affecting pregnancy, antepartum 10/01/2013  . Family history of congenital heart defect 07/17/2012  . Ureteral calculus, left 04/12/2011  . 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 Morbid obesity (HCC) 10/01/2010  . HSV 10/03/2006  . ATTENTION DEFICIT, W/HYPERACTIVITY 10/29/2005  . Lupus anticoagulant positive 10/29/2005      Clarita Crane, PT, DPT 02/06/18 10:19 AM    [facility] [address] [place], [place], [address] Phone: [phone]   Fax:  [phone]  Name: [name] MRN: [id number] Date of Birth: 12-28-1975

## 2018-02-12 ENCOUNTER — Encounter: Payer: Self-pay | Admitting: Physical Therapy

## 2018-02-12 ENCOUNTER — Ambulatory Visit: Payer: BLUE CROSS/BLUE SHIELD | Admitting: Physical Therapy

## 2018-02-12 DIAGNOSIS — R600 Localized edema: Secondary | ICD-10-CM | POA: Diagnosis not present

## 2018-02-12 DIAGNOSIS — M25562 Pain in left knee: Secondary | ICD-10-CM

## 2018-02-12 DIAGNOSIS — M6281 Muscle weakness (generalized): Secondary | ICD-10-CM

## 2018-02-12 DIAGNOSIS — M25662 Stiffness of left knee, not elsewhere classified: Secondary | ICD-10-CM | POA: Diagnosis not present

## 2018-02-12 DIAGNOSIS — R2689 Other abnormalities of gait and mobility: Secondary | ICD-10-CM

## 2018-02-12 NOTE — Therapy (Signed)
[facility] [address] [place], [place], [address] Phone: [phone]   Fax:  [phone]  Physical Therapy Treatment  Patient Details  Name: [name] MRN: [id number] Date of Birth: March 16, 1975 Referring Provider ([name]): Dr. Ramond Marrowax Varkey   Encounter Date: 02/12/2018  [name] End of Session - 02/12/18 1015    Visit Number  4    Number of Visits  24    Date for [name] Re-Evaluation  04/14/18    [name] Start Time  0931    [name] Stop Time  1010    [name] Time Calculation (min)  39 min    Activity Tolerance  Patient tolerated treatment well;No increased pain    Behavior During Therapy  WFL for tasks assessed/performed       Past Medical History:  Diagnosis Date  . ACL (anterior cruciate ligament) tear    MCL. LCL Left knee  . ADHD (attention deficit hyperactivity disorder)    husband  . Anemia   . B12 deficiency   . Blood clot in vein   . Chromosomal abnormality   . Congenital abnormality   . Constipation   . Headache   . HSV (herpes simplex virus) anogenital infection   . Joint pain   . Kidney stone on left side   . Lactose intolerance   . Miscarriage    x 8  . 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 Pinched nerve    neck/shoulder  . Plantar fasciitis   . Post partum depression     Past Surgical History:  Procedure Laterality Date  . arm surgery for fracture    . DILATION AND CURETTAGE OF UTERUS  1998  . KNEE SURGERY Left    torn menicus  . TONSILLECTOMY AND ADENOIDECTOMY    . WISDOM TOOTH EXTRACTION      There were no vitals filed for this visit.  Subjective Assessment - 02/12/18 0934    Subjective  a little stiff and sore.  she can feel the screws.    Patient Stated Goals  return to lifting weights    Currently in Pain?  Yes    Pain Score  2     Pain Location  Knee    Pain Orientation  Left    Pain Descriptors / Indicators  Aching;Dull    Pain Type  Surgical pain    Pain  Onset  1 to 4 weeks ago    Pain Frequency  Constant    Aggravating Factors   bending    Pain Relieving Factors  extension, rest                       [facility] Adult [name] Treatment/Exercise - 02/12/18 0934      Knee/Hip Exercises: Stretches   Quad Stretch  Left;5 reps;20 seconds      Knee/Hip Exercises: Aerobic   Nustep  L3 x 8 min; LEs only      Knee/Hip Exercises: Standing   Wall Squat  5 reps;5 seconds    Wall Squat Limitations  0-45 deg; stopped due to increased pain    Other Standing Knee Exercises  SLS glute med strengthening 10 x 5 sec bil      Knee/Hip Exercises: Supine   Short Arc Quad Sets  Left;2 sets;10 reps    Short Arc Quad Sets Limitations  focus on eccentric quad control    Bridges  Both;10 reps;2 sets    [employer]ridges Limitations  with RLE march               [name] Short Term Goals - 01/20/18 1311      [name] SHORT TERM GOAL #1   Title  independent with initial HEP    Status  New    Target Date  03/03/18      [name] SHORT TERM GOAL #2   Title  improve AROM Lt knee 0-120 for improved mobility    Status  New    Target Date  03/03/18      [name] SHORT TERM GOAL #3   Title  amb without deviations for improved function    Status  New    Target Date  03/03/18      [name] SHORT TERM GOAL #4   Title  report pain < 3/10 with activity for improved function    Status  New    Target Date  03/03/18        [name] Long Term Goals - 01/20/18 1313      [name] LONG TERM GOAL #1   Title  independent with advanced HEP    Status  New    Target Date  04/14/18      [name] LONG TERM GOAL #2   Title  improve FOTO score to </= 38% limited for improved function    Status  New    Target Date  04/14/18      [name] LONG TERM GOAL #3   Title  Lt knee AROM 0-140 for improved function and mobility    Status  New    Target Date  04/14/18      [name] LONG TERM GOAL #4   Title  demonstrate improved LLE strength of 5/5 for improved function and return to regular exercise    Status  New     Target Date  04/14/18            Plan - 02/12/18 1016    Clinical Impression Statement  [name] progressing well within protective phase of protocol, and improving quad control with each week.  Will continue to benefit from [name] to maximize function.    Rehab Potential  Good    [name] Frequency  2x / week    [name] Duration  12 weeks    [name] Treatment/Interventions  ADLs/Self Care Home Management;Cryotherapy;Electrical Stimulation;Ultrasound;Traction;Moist Heat;Therapeutic activities;Therapeutic exercise;Patient/family education;Manual techniques;Taping;Dry needling;Passive range of motion    [name] Next Visit Plan  review HEP, continue A/AAROM and gentle strengthening, try closed chain activities within protocol; try NMES for quad activation ([name] to bring shorts)    [name] Home Exercise Plan  Access Code: [email or web address]    Consulted and Agree with Plan of Care  Patient       Patient will benefit from skilled therapeutic intervention in order to improve the following deficits and impairments:  Abnormal gait, Increased muscle spasms, Pain, Decreased range of motion, Decreased strength, Impaired flexibility, Increased edema  Visit Diagnosis: Acute pain of left knee  Stiffness of left knee, not elsewhere classified  Localized edema  Muscle weakness (generalized)  Other abnormalities of gait and mobility     Problem List Patient Active Problem List   Diagnosis Date Noted  . Insulin resistance 02/04/2018  . Class 3 severe obesity with serious comorbidity and body mass index (BMI) of 40.0 to 44.9 in adult (HCC) 02/04/2018  . Chest pain 12/01/2017  . Psychological factors affecting morbid obesity (HCC) 07/08/2017  . Arthritis 06/18/2017  . Chronic foot pain 06/18/2017  .  Family history of colon cancer 12/12/2015  . Plantar fasciitis, bilateral 08/01/2015  . Short interval between pregnancies affecting pregnancy, antepartum 10/01/2013  . Family history of congenital heart defect 07/17/2012  . Ureteral  calculus, left 04/12/2011  . 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 Morbid obesity (HCC) 10/01/2010  . HSV 10/03/2006  . ATTENTION DEFICIT, W/HYPERACTIVITY 10/29/2005  . Lupus anticoagulant positive 10/29/2005      [name], [name], [name] 02/12/18 10:17 AM    [facility] [address] [place] [address] [place], [place], [address] Phone: [phone]   Fax:  [phone]  Name: [name] MRN: [id number] Date of Birth: 12-03-1975

## 2018-02-13 DIAGNOSIS — S83512D Sprain of anterior cruciate ligament of left knee, subsequent encounter: Secondary | ICD-10-CM | POA: Diagnosis not present

## 2018-02-16 ENCOUNTER — Encounter (INDEPENDENT_AMBULATORY_CARE_PROVIDER_SITE_OTHER): Payer: Self-pay | Admitting: Bariatrics

## 2018-02-16 ENCOUNTER — Ambulatory Visit (INDEPENDENT_AMBULATORY_CARE_PROVIDER_SITE_OTHER): Payer: BLUE CROSS/BLUE SHIELD | Admitting: Bariatrics

## 2018-02-16 VITALS — BP 109/72 | HR 79 | Temp 98.2°F | Ht 66.0 in | Wt 262.0 lb

## 2018-02-16 DIAGNOSIS — F3289 Other specified depressive episodes: Secondary | ICD-10-CM

## 2018-02-16 DIAGNOSIS — E559 Vitamin D deficiency, unspecified: Secondary | ICD-10-CM | POA: Diagnosis not present

## 2018-02-16 DIAGNOSIS — Z9189 Other specified personal risk factors, not elsewhere classified: Secondary | ICD-10-CM | POA: Diagnosis not present

## 2018-02-16 DIAGNOSIS — E8881 Metabolic syndrome: Secondary | ICD-10-CM | POA: Diagnosis not present

## 2018-02-16 DIAGNOSIS — Z6841 Body Mass Index (BMI) 40.0 and over, adult: Secondary | ICD-10-CM

## 2018-02-16 MED ORDER — VITAMIN D (ERGOCALCIFEROL) 1.25 MG (50000 UNIT) PO CAPS: 50000.0000 [IU] | ORAL_CAPSULE | ORAL | 0 refills | Status: DC

## 2018-02-16 MED ORDER — BUPROPION HCL ER (SR) 150 MG PO TB12: 150.0000 mg | ORAL_TABLET | Freq: Every day | ORAL | 0 refills | Status: DC

## 2018-02-16 NOTE — Progress Notes (Signed)
Office: [phone]  /  Fax: [phone]   HPI:   Chief Complaint: OBESITY [name] is here to discuss her progress with her obesity treatment plan. She is keeping a food journal with 450 to 600 calories and 40 grams of protein for supper, following the Category 3 plan and is following her eating plan approximately 75 % of the time. She states she is exercising 0 minutes 0 times per week. [name] had recent ACL surgery that was discussed at her last visit. She has to wear the immobilizer for 2 more weeks.  Her weight is 262 lb (118.8 kg) today and has had a weight loss of 1 pounds over a period of 2 weeks since her last visit. She has lost 0 lbs since starting treatment with us.  Insulin Resistance [name] has a diagnosis of insulin resistance based on her elevated fasting insulin level >5. Although [name]'s blood glucose readings are still under good control, insulin resistance puts her at greater risk of metabolic syndrome and diabetes. She is not taking metformin currently and continues to work on diet and exercise to decrease risk of diabetes. She denies polyphagia.  Vitamin D deficiency [name] has a diagnosis of vitamin D deficiency. She took vit D for 2 months.   At risk for osteopenia and osteoporosis [name] is at higher risk of osteopenia and osteoporosis due to vitamin D deficiency.   Depression with emotional eating behaviors [name] is struggling with emotional eating and using food for comfort to the extent that it is negatively impacting her health. She often snacks when she is not hungry. [name] sometimes feels she is out of control and then feels guilty that she made poor food choices. She has been working on behavior modification techniques to help reduce her emotional eating and has been somewhat successful. She denies relative or absolute contraindication. She shows no sign of suicidal or homicidal ideations.  ASSESSMENT AND PLAN:  Insulin resistance  Vitamin D  deficiency - Plan: Vitamin D, Ergocalciferol, (DRISDOL) 1.25 MG (50000 UT) CAPS capsule  Other depression - with emotional eating - Plan: buPROPion (WELLBUTRIN SR) 150 MG 12 hr tablet  At risk for osteoporosis  Class 3 severe obesity with serious comorbidity and body mass index (BMI) of 40.0 to 44.9 in adult, unspecified obesity type (HCC)  PLAN:  Insulin Resistance [name] will continue to work on weight loss, exercise, and decreasing simple carbohydrates in her diet to help decrease the risk of diabetes.She was informed that eating too many simple carbohydrates or too many calories at one sitting increases the likelihood of GI side effects. [name] agreed to decrease carbohydrates in her diet and to follow up with us as directed to monitor her progress in 2 weeks.  Vitamin D Deficiency [name] was informed that low vitamin D levels contributes to fatigue and are associated with obesity, breast, and colon cancer. She agrees to continue to take prescription Vit D @50 ,000 IU every week #4 with no refills and will follow up for routine testing of vitamin D, at least 2-3 times per year. She was informed of the risk of over-replacement of vitamin D and agrees to not increase her dose unless she discusses this with us first. [name] agrees to follow up as directed.  At risk for osteopenia and osteoporosis [name] was given extended (15 minutes) osteoporosis prevention counseling today. [name] is at risk for osteopenia and osteoporosis due to her vitamin D deficiency. She was encouraged to take her vitamin D and follow her higher calcium  diet and increase strengthening exercise to help strengthen her bones and decrease her risk of osteopenia and osteoporosis.  Depression with Emotional Eating Behaviors We discussed behavior modification techniques today to help [name] deal with her emotional eating and depression. She has agreed to take Wellbutrin SR 150mg , 1 PO qd #30 with no refills and agreed to  follow up as directed.  Obesity [name] is currently in the action stage of change. As such, her goal is to continue with weight loss efforts. She has agreed to keep a food journal with 450 to 600 calories and 40 grams of protein for supper and follow the Category 3 plan. She will carry her protein with her, increase her water intake to 64 ounces, and to figure out the triggers for her emotional eating. [name] has been instructed to work up to a goal of 150 minutes of combined cardio and strengthening exercise per week for weight loss and overall health benefits. We discussed the following Behavioral Modification Strategies today: increasing lean protein intake, decreasing simple carbohydrates, increasing vegetables,increase H2O intake, work on meal planning and easy cooking plans, keeping healthy foods in the home, and decrease liquid calories.  [name] has agreed to follow up with our clinic in 2 weeks. She was informed of the importance of frequent follow up visits to maximize her success with intensive lifestyle modifications for her multiple health conditions.  ALLERGIES: Allergies  Allergen Reactions  . Dairy Aid [Lactase] Other (See Comments)    Excess gas, bloating, diarrhea  . Nickel   . No Known Allergies     MEDICATIONS: Current Outpatient Medications on File Prior to Visit  Medication Sig Dispense Refill  . acetaminophen (TYLENOL 8 HOUR) 650 MG CR tablet Take 1 tablet by mouth as needed.    . meloxicam (MOBIC) 15 MG tablet Take 7.5 mg by mouth as needed.     . Multiple Vitamin (MULTIVITAMIN) tablet Take 1 tablet by mouth daily.     No current facility-administered medications on file prior to visit.     PAST MEDICAL HISTORY: Past Medical History:  Diagnosis Date  . ACL (anterior cruciate ligament) tear    MCL. LCL Left knee  . ADHD (attention deficit hyperactivity disorder)    husband  . Anemia   . B12 deficiency   . Blood clot in vein   . Chromosomal abnormality    . Congenital abnormality   . Constipation   . Headache   . HSV (herpes simplex virus) anogenital infection   . Joint pain   . Kidney stone on left side   . Lactose intolerance   . Miscarriage    x 8  . 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 Pinched nerve    neck/shoulder  . Plantar fasciitis   . Post partum depression     PAST SURGICAL HISTORY: Past Surgical History:  Procedure Laterality Date  . arm surgery for fracture    . DILATION AND CURETTAGE OF UTERUS  1998  . KNEE SURGERY Left    torn menicus  . TONSILLECTOMY AND ADENOIDECTOMY    . WISDOM TOOTH EXTRACTION      SOCIAL HISTORY: Social History   Tobacco Use  . Smoking status: Former Smoker    Types: Cigarettes    Last attempt to quit: 01/27/2004    Years since quitting: 14.0  . Smokeless tobacco: Never Used  Substance Use Topics  . Alcohol use: No  . Drug use: No    FAMILY HISTORY:  Family History  Adopted: Yes  Problem Relation Age of Onset  . Autism spectrum disorder Daughter   . Other Daughter        Pancreatic insufficiency.   . Colon cancer Mother 64       died at age 86  . Colon polyps Mother   . Colon cancer Maternal Grandmother 3  . Colon cancer Other 89       mat great grand mother  . Lung cancer Father 22       smoker  . Heart attack Maternal Grandfather   . Hypertension Maternal Grandfather   . Diabetes Paternal Grandmother        type 1  . Breast cancer Maternal Aunt 20  . Ovarian cancer Maternal Aunt   . Other Maternal Aunt        reproductive issues  . Colon cancer Maternal Uncle   . Chiari malformation Sister    ROS: Review of Systems  Constitutional: Positive for weight loss.  Endo/Heme/Allergies:       Negative for polyphagia.  Psychiatric/Behavioral: Positive for depression. Negative for suicidal ideas.       Negative for homicidal ideations.   PHYSICAL EXAM: Blood pressure 109/72, pulse 79, temperature 98.2 F (36.8 C), temperature  source Oral, height 5\' 6"  (1.676 Shannon), weight 262 lb (118.8 kg), last menstrual period 01/12/2018, SpO2 97 %, unknown if currently breastfeeding. Body mass index is 42.29 kg/Shannon. Physical Exam Vitals signs reviewed.  Constitutional:      Appearance: Normal appearance. She is obese.  Cardiovascular:     Rate and Rhythm: Normal rate.  Pulmonary:     Effort: Pulmonary effort is normal.  Musculoskeletal: Normal range of motion.  Skin:    General: Skin is warm and dry.  Neurological:     Mental Status: She is alert and oriented to person, place, and time.  Psychiatric:        Mood and Affect: Mood normal.        Behavior: Behavior normal.    RECENT LABS AND TESTS: BMET    Component Value Date/Time   NA 137 11/18/2017 1652   NA 140 11/11/2017 1249   K 3.6 11/18/2017 1652   CL 104 11/18/2017 1652   CO2 25 11/18/2017 1652   GLUCOSE 95 11/18/2017 1652   BUN 22 (H) 11/18/2017 1652   BUN 11 11/11/2017 1249   CREATININE 0.77 11/18/2017 1652   CREATININE 0.71 10/03/2016 1152   CALCIUM 9.4 11/18/2017 1652   GFRNONAA >60 11/18/2017 1652   GFRNONAA 107 10/03/2016 1152   GFRAA >60 11/18/2017 1652   GFRAA 124 10/03/2016 1152   Lab Results  Component Value Date   HGBA1C 5.3 11/11/2017   HGBA1C 5.1 01/19/2010   Lab Results  Component Value Date   INSULIN 8.9 11/11/2017   CBC    Component Value Date/Time   WBC 13.5 (H) 11/18/2017 1652   RBC 4.69 11/18/2017 1652   HGB 12.3 11/18/2017 1652   HGB 11.9 11/11/2017 1249   HCT 38.8 11/18/2017 1652   HCT 36.8 11/11/2017 1249   PLT 412 (H) 11/18/2017 1652   MCV 82.7 11/18/2017 1652   MCV 82 11/11/2017 1249   MCH 26.2 11/18/2017 1652   MCHC 31.7 11/18/2017 1652   RDW 14.7 11/18/2017 1652   RDW 14.5 11/11/2017 1249   LYMPHSABS 2.8 11/11/2017 1249   MONOABS 0.8 01/26/2017 1055   EOSABS 0.2 11/11/2017 1249   BASOSABS 0.1 11/11/2017 1249   Iron/TIBC/Ferritin/ %Sat  Component Value Date/Time   FERRITIN 24 12/08/2015 1418   Lipid  Panel     Component Value Date/Time   CHOL 163 11/11/2017 1249   TRIG 128 11/11/2017 1249   HDL 41 11/11/2017 1249   LDLCALC 96 11/11/2017 1249   Hepatic Function Panel     Component Value Date/Time   PROT 7.5 11/11/2017 1249   ALBUMIN 4.3 11/11/2017 1249   AST 12 11/11/2017 1249   ALT 10 11/11/2017 1249   ALKPHOS 71 11/11/2017 1249   BILITOT 0.3 11/11/2017 1249      Component Value Date/Time   TSH 1.300 11/11/2017 1249   TSH 1.13 10/03/2016 1152   TSH 1.12 12/08/2015 1418   Results for Duwaine MaxinLLIS, [name] (MRN [id number]) as of 02/16/2018 16:06  Ref. Range 11/11/2017 12:49  Vitamin D, 25-Hydroxy Latest Ref Range: 30.0 - 100.0 ng/mL 38.8    OBESITY BEHAVIORAL INTERVENTION VISIT  Today's visit was # 5   Starting weight: 250 lbs Starting date: 11/11/17 Today's weight : Weight: 262 lb (118.8 kg)  Today's date: 02/16/2018 Total lbs lost to date: 0  ASK: We discussed the diagnosis of obesity with Duwaine MaxinHeather Shannon Shannon today and [name] agreed to give us permission to discuss obesity behavioral modification therapy today.  ASSESS: [name] has the diagnosis of obesity and her BMI today is 42.3. [name] is in the action stage of change.   ADVISE: [name] was educated on the multiple health risks of obesity as well as the benefit of weight loss to improve her health. She was advised of the need for long term treatment and the importance of lifestyle modifications to improve her current health and to decrease her risk of future health problems.  AGREE: Multiple dietary modification options and treatment options were discussed and Dekisha agreed to follow the recommendations documented in the above note.  ARRANGE: [name] was educated on the importance of frequent visits to treat obesity as outlined per CMS and USPSTF guidelines and agreed to schedule her next follow up appointment today.  I, Kirke Corinara Soares, am acting as Energy managertranscriptionist for [employer] Corporation[employer]el A. Manson PasseyBrown, DO  I have reviewed the  above documentation for accuracy and completeness, and I agree with the above. -Corinna CapraAngel Birdia Jaycox, DO

## 2018-02-20 ENCOUNTER — Ambulatory Visit: Payer: BLUE CROSS/BLUE SHIELD | Admitting: Physical Therapy

## 2018-02-20 ENCOUNTER — Encounter: Payer: Self-pay | Admitting: Physical Therapy

## 2018-02-20 DIAGNOSIS — M6281 Muscle weakness (generalized): Secondary | ICD-10-CM | POA: Diagnosis not present

## 2018-02-20 DIAGNOSIS — M25662 Stiffness of left knee, not elsewhere classified: Secondary | ICD-10-CM

## 2018-02-20 DIAGNOSIS — R2689 Other abnormalities of gait and mobility: Secondary | ICD-10-CM

## 2018-02-20 DIAGNOSIS — M25562 Pain in left knee: Secondary | ICD-10-CM

## 2018-02-20 DIAGNOSIS — R600 Localized edema: Secondary | ICD-10-CM | POA: Diagnosis not present

## 2018-02-20 NOTE — Therapy (Signed)
[facility] [address] [place], [place], [address] Phone: [phone]   Fax:  [phone]  Physical Therapy Treatment  Patient Details  Name: Doris MaxinHeather M Shannon MRN: [id number] Date of Birth: 09-20-1975 Referring Provider (PT): Dr. Ramond Marrowax Varkey   Encounter Date: 02/20/2018  PT End of Session - 02/20/18 1035    Visit Number  5    Number of Visits  24    Date for PT Re-Evaluation  04/14/18    PT Start Time  0931    PT Stop Time  1012    PT Time Calculation (min)  41 min    Activity Tolerance  Patient tolerated treatment well;No increased pain    Behavior During Therapy  WFL for tasks assessed/performed       Past Medical History:  Diagnosis Date  . ACL (anterior cruciate ligament) tear    MCL. LCL Left knee  . ADHD (attention deficit hyperactivity disorder)    husband  . Anemia   . B12 deficiency   . Blood clot in vein   . Chromosomal abnormality   . Congenital abnormality   . Constipation   . Headache   . HSV (herpes simplex virus) anogenital infection   . Joint pain   . Kidney stone on left side   . Lactose intolerance   . Miscarriage    x 8  . 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 Pinched nerve    neck/shoulder  . Plantar fasciitis   . Post partum depression     Past Surgical History:  Procedure Laterality Date  . arm surgery for fracture    . DILATION AND CURETTAGE OF UTERUS  1998  . KNEE SURGERY Left    torn menicus  . TONSILLECTOMY AND ADENOIDECTOMY    . WISDOM TOOTH EXTRACTION      There were no vitals filed for this visit.  Subjective Assessment - 02/20/18 0933    Subjective  doing well, MD is pleased with progress.  no running before 18 weeks (protocol is wrong.    Patient Stated Goals  return to lifting weights    Currently in Pain?  No/denies                       [facility] Adult PT Treatment/Exercise - 02/20/18 0935      Knee/Hip  Exercises: Stretches   Passive Hamstring Stretch  Left;3 reps;30 seconds    Passive Hamstring Stretch Limitations  with overpressure at distal quad; supine with strap      Knee/Hip Exercises: Aerobic   Recumbent Bike  L3 x 8 min      Knee/Hip Exercises: Supine   Quad Sets  Left    Quad Sets Limitations  5 sec hold with NMES x 12 min    Short Arc [employer]ad Sets  Left    Short Arc Quad Sets Limitations  5 sec hold with NMES x 8 min      Modalities   Modalities  Geologist, engineeringlectrical Stimulation      Electrical Stimulation   Electrical Stimulation Location  Lt VMO/quad    Electrical Stimulation Action  symmetric biphasic    Electrical Stimulation Parameters  5 sec on/5 sec off up to 35 mA    Statisticianlectrical Stimulation Goals  Strength               PT Short Term Goals - 01/20/18 1311      PT SHORT TERM GOAL #  1   Title  independent with initial HEP    Status  New    Target Date  03/03/18      PT SHORT TERM GOAL #2   Title  improve AROM Lt knee 0-120 for improved mobility    Status  New    Target Date  03/03/18      PT SHORT TERM GOAL #3   Title  amb without deviations for improved function    Status  New    Target Date  03/03/18      PT SHORT TERM GOAL #4   Title  report pain < 3/10 with activity for improved function    Status  New    Target Date  03/03/18        PT Long Term Goals - 01/20/18 1313      PT LONG TERM GOAL #1   Title  independent with advanced HEP    Status  New    Target Date  04/14/18      PT LONG TERM GOAL #2   Title  improve FOTO score to </= 38% limited for improved function    Status  New    Target Date  04/14/18      PT LONG TERM GOAL #3   Title  Lt knee AROM 0-140 for improved function and mobility    Status  New    Target Date  04/14/18      PT LONG TERM GOAL #4   Title  demonstrate improved LLE strength of 5/5 for improved function and return to regular exercise    Status  New    Target Date  04/14/18            Plan - 02/20/18  1035    Clinical Impression Statement  Utilized NMES today to facilitate VMO activation with improved activation during and following NMES.  Overall progressing well with PT and anticipate transition into next phase of protocol next week.    Rehab Potential  Good    PT Frequency  2x / week    PT Duration  12 weeks    PT Treatment/Interventions  ADLs/Self Care Home Management;Cryotherapy;Electrical Stimulation;Ultrasound;Traction;Moist Heat;Therapeutic activities;Therapeutic exercise;Patient/family education;Manual techniques;Taping;Dry needling;Passive range of motion    PT Next Visit Plan  measure, continue NMES, progress into phase 2    PT Home Exercise Plan  Access Code: [email or web address]    Consulted and Agree with Plan of Care  Patient       Patient will benefit from skilled therapeutic intervention in order to improve the following deficits and impairments:  Abnormal gait, Increased muscle spasms, Pain, Decreased range of motion, Decreased strength, Impaired flexibility, Increased edema  Visit Diagnosis: Acute pain of left knee  Stiffness of left knee, not elsewhere classified  Localized edema  Muscle weakness (generalized)  Other abnormalities of gait and mobility     Problem List Patient Active Problem List   Diagnosis Date Noted  . Insulin resistance 02/04/2018  . Class 3 severe obesity with serious comorbidity and body mass index (BMI) of 40.0 to 44.9 in adult (HCC) 02/04/2018  . Chest pain 12/01/2017  . Psychological factors affecting morbid obesity (HCC) 07/08/2017  . Arthritis 06/18/2017  . Chronic foot pain 06/18/2017  . Family history of colon cancer 12/12/2015  . Plantar fasciitis, bilateral 08/01/2015  . Short interval between pregnancies affecting pregnancy, antepartum 10/01/2013  . Family history of congenital heart defect 07/17/2012  . Ureteral calculus, left 04/12/2011  . 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  Morbid obesity (HCC) 10/01/2010  . HSV 10/03/2006   . ATTENTION DEFICIT, W/HYPERACTIVITY 10/29/2005  . Lupus anticoagulant positive 10/29/2005      Clarita Crane, PT, DPT 02/20/18 10:39 AM    [facility] [address] [place], [place], [address] Phone: [phone]   Fax:  [phone]  Name: SHAUNDA LOCKLIN MRN: [id number] Date of Birth: 14-Jan-1976

## 2018-02-27 ENCOUNTER — Encounter: Payer: BLUE CROSS/BLUE SHIELD | Admitting: Physical Therapy

## 2018-03-04 ENCOUNTER — Encounter (INDEPENDENT_AMBULATORY_CARE_PROVIDER_SITE_OTHER): Payer: Self-pay | Admitting: Bariatrics

## 2018-03-04 ENCOUNTER — Ambulatory Visit (INDEPENDENT_AMBULATORY_CARE_PROVIDER_SITE_OTHER): Payer: BLUE CROSS/BLUE SHIELD | Admitting: Bariatrics

## 2018-03-04 ENCOUNTER — Ambulatory Visit: Payer: BLUE CROSS/BLUE SHIELD | Admitting: Cardiology

## 2018-03-04 VITALS — BP 105/66 | HR 69 | Temp 97.9°F | Ht 66.0 in | Wt 254.0 lb

## 2018-03-04 DIAGNOSIS — E8881 Metabolic syndrome: Secondary | ICD-10-CM | POA: Diagnosis not present

## 2018-03-04 DIAGNOSIS — Z87898 Personal history of other specified conditions: Secondary | ICD-10-CM

## 2018-03-04 DIAGNOSIS — F3289 Other specified depressive episodes: Secondary | ICD-10-CM

## 2018-03-04 DIAGNOSIS — Z6841 Body Mass Index (BMI) 40.0 and over, adult: Secondary | ICD-10-CM

## 2018-03-05 ENCOUNTER — Ambulatory Visit: Payer: BLUE CROSS/BLUE SHIELD | Admitting: Cardiology

## 2018-03-05 ENCOUNTER — Encounter: Payer: Self-pay | Admitting: Cardiology

## 2018-03-05 VITALS — BP 116/68 | HR 68 | Ht 66.0 in | Wt 260.0 lb

## 2018-03-05 DIAGNOSIS — R072 Precordial pain: Secondary | ICD-10-CM

## 2018-03-05 DIAGNOSIS — R011 Cardiac murmur, unspecified: Secondary | ICD-10-CM | POA: Diagnosis not present

## 2018-03-05 DIAGNOSIS — Z8279 Family history of other congenital malformations, deformations and chromosomal abnormalities: Secondary | ICD-10-CM | POA: Diagnosis not present

## 2018-03-05 NOTE — Patient Instructions (Signed)
Medication Instructions:  Your physician recommends that you continue on your current medications as directed. Please refer to the Current Medication list given to you today.  If you need a refill on your cardiac medications before your next appointment, please call your pharmacy.   Lab work: None.  If you have labs (blood work) drawn today and your tests are completely normal, you will receive your results only by: . MyChart Message (if you have MyChart) OR . A paper copy in the mail If you have any lab test that is abnormal or we need to change your treatment, we will call you to review the results.  Testing/Procedures: None.   Follow-Up: At [facility], you and your health needs are our priority.  As part of our continuing mission to provide you with exceptional heart care, we have created designated Provider Care Teams.  These Care Teams include your primary Cardiologist (physician) and Advanced Practice Providers (APPs -  Physician Assistants and Nurse Practitioners) who all work together to provide you with the care you need, when you need it. You will need a follow up appointment in 3 months.  Please call our office 2 months in advance to schedule this appointment.  You may see No primary care provider on file. or another member of our [facility] Provider Team in [place]: Brian Munley, MD . Rajan Revankar, MD  Any Other Special Instructions Will Be Listed Below (If Applicable).     

## 2018-03-05 NOTE — Progress Notes (Signed)
Office: [phone]  /  Fax: [phone]   HPI:   Chief Complaint: OBESITY [name] is here to discuss her progress with her obesity treatment plan. She is on the keep a food journal with 450 to 600 calories and 40 grams of protein daily and the Category 3 plan and is following her eating plan approximately 50 % of the time. She states she is exercising 0 minutes 0 times per week. [name] is doing well, but she attributes her weight loss to being sick (had virus).  Her weight is 254 lb (115.2 kg) today and has had a weight loss of 8 pounds over a period of 2 weeks since her last visit. She has gained 4 lbs since starting treatment with [place].  Insulin Resistance [name] has a diagnosis of insulin resistance based on her elevated fasting insulin level >5. Although [name]'s blood glucose readings are still under good control, insulin resistance puts her at greater risk of metabolic syndrome and diabetes. She is not taking metformin currently and continues to work on diet and exercise to decrease risk of diabetes. Lyda denies polyphagia.  History of Syncope Caidynce has a history of syncope and she is to see the cardiologist tomorrow. She had echocardiogram 11/17/17.  Depression with emotional eating behaviors [name] is struggling with emotional eating and using food for comfort to the extent that it is negatively impacting her health. She often snacks when she is not hungry. [name] sometimes feels she is out of control and then feels guilty that she made poor food choices. She is currently taking Bupropion. She has been working on behavior modification techniques to help reduce her emotional eating and has been somewhat successful. She shows no sign of suicidal or homicidal ideations.  Depression screen [facility] 2/9 11/11/2017 11/05/2017 10/03/2016  Decreased Interest 1 0 0  Down, Depressed, Hopeless 1 0 0  PHQ - 2 Score 2 0 0  Altered sleeping 1 - -  Tired, decreased energy 2 - -  Change in  appetite 1 - -  Feeling bad or failure about yourself  0 - -  Trouble concentrating 0 - -  Moving slowly or fidgety/restless 0 - -  Suicidal thoughts 0 - -  PHQ-9 Score 6 - -  Difficult doing work/chores Not difficult at all - -    ASSESSMENT AND PLAN:  Insulin resistance  Other depression - with emotional eating  History of syncope  Class 3 severe obesity with serious comorbidity and body mass index (BMI) of 40.0 to 44.9 in adult, unspecified obesity type ([name])  PLAN:  Insulin Resistance [name] will continue to work on weight loss, exercise, increasing lean protein and decreasing simple carbohydrates in her diet to help decrease the risk of diabetes. She was informed that eating too many simple carbohydrates or too many calories at one sitting increases the likelihood of GI side effects. [name] to monitor her progress.  History of Syncope [name] will follow up with the cardiologist tomorrow and she will follow up with our clinic in 2 weeks.  Depression with Emotional Eating Behaviors We discussed behavior modification techniques today to help [name] deal with her emotional eating and depression. She will resume Bupropion after her cardiology consult. [name] will follow up as Shannon.  I spent > than 50% of the 15 minute visit on counseling as documented in the note.  Obesity [name] is currently in the action stage of change. As such, her goal is to continue  with weight loss efforts She has agreed to keep a food journal with 450 to 600 calories and 40 grams of protein at supper daily  and follow the Category 3 plan [name] has been instructed to work up to a goal of 150 minutes of combined cardio and strengthening exercise per week for weight loss and overall health benefits. We discussed the following Behavioral Modification Strategies today: increase H2O intake, no skipping meals, increasing lean protein intake, decreasing simple  carbohydrates, increasing vegetables and work on meal planning and easy cooking plans  [name] has agreed to follow up with our clinic in 2 weeks. She was informed of the importance of frequent follow up visits to maximize her success with intensive lifestyle modifications for her multiple health conditions.  ALLERGIES: Allergies  Allergen Reactions  . Dairy Aid [Lactase] Other (See Comments)    Excess gas, bloating, diarrhea  . Nickel     MEDICATIONS: Current Outpatient Medications on File Prior to Visit  Medication Sig Dispense Refill  . acetaminophen (TYLENOL 8 HOUR) 650 MG CR tablet Take 1 tablet by mouth as needed.    Marland Kitchen. buPROPion (WELLBUTRIN SR) 150 MG 12 hr tablet Take 1 tablet (150 mg total) by mouth daily. (Patient not taking: Reported on 03/05/2018) 30 tablet 0  . Meloxicam 7.5 MG TBDP Take 7.5 mg by mouth as needed.     . Multiple Vitamin (MULTIVITAMIN) tablet Take 1 tablet by mouth daily.     No current facility-administered medications on file prior to visit.     PAST MEDICAL HISTORY: Past Medical History:  Diagnosis Date  . ACL (anterior cruciate ligament) tear    MCL. LCL Left knee  . ADHD (attention deficit hyperactivity disorder)    husband  . Anemia   . B12 deficiency   . Blood clot in vein   . Chromosomal abnormality   . Congenital abnormality   . Constipation   . Headache   . HSV (herpes simplex virus) anogenital infection   . Joint pain   . Kidney stone on left side   . Lactose intolerance   . Miscarriage    x 8  . MTHFR (methylene THF reductase) deficiency and homocystinuria ([name])   . Pinched nerve    neck/shoulder  . Plantar fasciitis   . Post partum depression     PAST SURGICAL HISTORY: Past Surgical History:  Procedure Laterality Date  . arm surgery for fracture    . DILATION AND CURETTAGE OF UTERUS  1998  . KNEE SURGERY Left    torn menicus  . TONSILLECTOMY AND ADENOIDECTOMY    . WISDOM TOOTH EXTRACTION      SOCIAL HISTORY: Social  History   Tobacco Use  . Smoking status: Former Smoker    Types: Cigarettes    Last attempt to quit: 01/27/2004    Years since quitting: 14.1  . Smokeless tobacco: Never Used  Substance Use Topics  . Alcohol use: No  . Drug use: No    FAMILY HISTORY: Family History  Adopted: Yes  Problem Relation Age of Onset  . Autism spectrum disorder Daughter   . Other Daughter        Pancreatic insufficiency.   . Colon cancer Mother [id number]       died at age 41  . Colon polyps Mother   . Colon cancer Maternal Grandmother [id number]  . Colon cancer Other 89       mat great grand mother  . Lung cancer Father [id number]  smoker  . Heart attack Maternal Grandfather   . Hypertension Maternal Grandfather   . Diabetes Paternal Grandmother        type 1  . Breast cancer Maternal Aunt 20  . Ovarian cancer Maternal Aunt   . Other Maternal Aunt        reproductive issues  . Colon cancer Maternal Uncle   . Chiari malformation Sister     ROS: Review of Systems  Constitutional: Positive for weight loss.  Endo/Heme/Allergies:       Negative for polyphagia  Psychiatric/Behavioral: Positive for depression. Negative for suicidal ideas.    PHYSICAL EXAM: Blood pressure 105/66, pulse 69, temperature 97.9 F (36.6 C), temperature source Oral, height 5\' 6"  (1.676 m), weight 254 lb (115.2 kg), SpO2 98 %, unknown if currently breastfeeding. Body mass index is 41 kg/m. Physical Exam Vitals signs reviewed.  Constitutional:      Appearance: Normal appearance. She is well-developed. She is obese.  Cardiovascular:     Rate and Rhythm: Normal rate.  Pulmonary:     Effort: Pulmonary effort is normal.  Musculoskeletal: Normal range of motion.  Skin:    General: Skin is warm and dry.  Neurological:     Mental Status: She is alert and oriented to person, place, and time.  Psychiatric:        Mood and Affect: Mood normal.        Behavior: Behavior normal.        Thought Content: Thought content does not include  homicidal or suicidal ideation.     RECENT LABS AND TESTS: BMET    Component Value Date/Time   NA 137 11/18/2017 1652   NA 140 11/11/2017 1249   K 3.6 11/18/2017 1652   CL 104 11/18/2017 1652   CO2 25 11/18/2017 1652   GLUCOSE 95 11/18/2017 1652   BUN 22 (H) 11/18/2017 1652   BUN 11 11/11/2017 1249   CREATININE 0.77 11/18/2017 1652   CREATININE 0.71 10/03/2016 1152   CALCIUM 9.4 11/18/2017 1652   GFRNONAA >60 11/18/2017 1652   GFRNONAA 107 10/03/2016 1152   GFRAA >60 11/18/2017 1652   GFRAA 124 10/03/2016 1152   Lab Results  Component Value Date   HGBA1C 5.3 11/11/2017   HGBA1C 5.1 01/19/2010   Lab Results  Component Value Date   INSULIN 8.9 11/11/2017   CBC    Component Value Date/Time   WBC 13.5 (H) 11/18/2017 1652   RBC 4.69 11/18/2017 1652   HGB 12.3 11/18/2017 1652   HGB 11.9 11/11/2017 1249   HCT 38.8 11/18/2017 1652   HCT 36.8 11/11/2017 1249   PLT 412 (H) 11/18/2017 1652   MCV 82.7 11/18/2017 1652   MCV 82 11/11/2017 1249   MCH 26.2 11/18/2017 1652   MCHC 31.7 11/18/2017 1652   RDW 14.7 11/18/2017 1652   RDW 14.5 11/11/2017 1249   LYMPHSABS 2.8 11/11/2017 1249   MONOABS 0.8 01/26/2017 1055   EOSABS 0.2 11/11/2017 1249   BASOSABS 0.1 11/11/2017 1249   Iron/TIBC/Ferritin/ %Sat    Component Value Date/Time   FERRITIN 24 12/08/2015 1418   Lipid Panel     Component Value Date/Time   CHOL 163 11/11/2017 1249   TRIG 128 11/11/2017 1249   HDL 41 11/11/2017 1249   LDLCALC 96 11/11/2017 1249   Hepatic Function Panel     Component Value Date/Time   PROT 7.5 11/11/2017 1249   ALBUMIN 4.3 11/11/2017 1249   AST 12 11/11/2017 1249   ALT 10 11/11/2017  1249   ALKPHOS 71 11/11/2017 1249   BILITOT 0.3 11/11/2017 1249      Component Value Date/Time   TSH 1.300 11/11/2017 1249   TSH 1.13 10/03/2016 1152   TSH 1.12 12/08/2015 1418     Ref. Range 11/11/2017 12:49  Vitamin D, 25-Hydroxy Latest Ref Range: 30.0 - 100.0 ng/mL 38.8     OBESITY  BEHAVIORAL INTERVENTION VISIT  Today's visit was # 6   Starting weight: 250 lbs Starting date: 11/11/2017 Today's weight : 254 lbs  Today's date: 03/04/2018 Total lbs lost to date: 0   ASK: We discussed the diagnosis of obesity with [name] today and [name] agreed to give [place] permission to discuss obesity behavioral modification therapy today.  ASSESS: [name] has the diagnosis of obesity and her BMI today is 41.02 Lashaina is in the action stage of change   ADVISE: [name] was educated on the multiple health risks of obesity as well as the benefit of weight loss to improve her health. She was advised of the need for long term treatment and the importance of lifestyle modifications to improve her current health and to decrease her risk of future health problems.  AGREE: Multiple dietary modification options and treatment options were discussed and  Lillianna agreed to follow the recommendations documented in the above note.  ARRANGE: Toluwanimi was educated on the importance of frequent visits to treat obesity as outlined per CMS and USPSTF guidelines and agreed to schedule her next follow up appointment today.  Cristi Loron, am acting as Energy manager for [employer]. Manson Passey, DO I have reviewed the above documentation for accuracy and completeness, and I agree with the above. -Corinna Capra, DO

## 2018-03-05 NOTE — Progress Notes (Signed)
Cardiology Office Note:    Date:  03/05/2018   ID:  [name], DOB January 27, 1975, MRN [id number]  PCP:  Agapito GamesMetheney, Catherine D, MD  Cardiologist:  Gypsy Balsamobert Jadian Karman, MD    Referring MD: Agapito GamesMetheney, Catherine D, *   Chief Complaint  Patient presents with  . Follow-up  Doing very well  History of Present Illness:    [name] is a 43 y.o. female with a constellation of some atypical symptoms which include atypical chest pain.  Also sensation of heart racing up to 90 when she is trying to go to bed and sleep.  Recently she had ACL done on the left knee very happy and satisfied with results and doing very well.  She used to workout quite heavily she used to lift weights obviously because of problem with the knee she was unable to do it but now she is gradually getting ready to go back to the gym.  Denies having any chest pain no tightness no squeezing no pressure no burning in the chest.  She tells me about the fact that somebody heart murmur in her heart I am listening to her today and I do not hear anything.  Her echocardiogram was done which was normal.  Stress test was also done showed good exercise tolerance without any pathology.  I told her that from my standpoint review she is ready to go back to gym.  I told her to let me know if she will have any issues.  I encouraged her to maintain good hydration and making sure that she drinks while exercising.  Past Medical History:  Diagnosis Date  . ACL (anterior cruciate ligament) tear    MCL. LCL Left knee  . ADHD (attention deficit hyperactivity disorder)    husband  . Anemia   . B12 deficiency   . Blood clot in vein   . Chromosomal abnormality   . Congenital abnormality   . Constipation   . Headache   . HSV (herpes simplex virus) anogenital infection   . Joint pain   . Kidney stone on left side   . Lactose intolerance   . Miscarriage    x 8  . 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 Pinched  nerve    neck/shoulder  . Plantar fasciitis   . Post partum depression     Past Surgical History:  Procedure Laterality Date  . arm surgery for fracture    . DILATION AND CURETTAGE OF UTERUS  1998  . KNEE SURGERY Left    torn menicus  . TONSILLECTOMY AND ADENOIDECTOMY    . WISDOM TOOTH EXTRACTION      Current Medications: Current Meds  Medication Sig  . acetaminophen (TYLENOL 8 HOUR) 650 MG CR tablet Take 1 tablet by mouth as needed.  . diclofenac sodium (VOLTAREN) 1 % GEL Apply topically as needed.  . Meloxicam 7.5 MG TBDP Take 7.5 mg by mouth as needed.   . Multiple Vitamin (MULTIVITAMIN) tablet Take 1 tablet by mouth daily.     Allergies:   Dairy aid [lactase] and Nickel   Social History   Socioeconomic History  . Marital status: Married    Spouse name: Morrie Sheldonshley  . Number of children: 8  . Years of education: college  . Highest education level: Not on file  Occupational History  . Occupation: Futures traderHomemaker  Social Needs  . Financial resource strain: Not on file  . Food insecurity:    Worry: Not on  file    Inability: Not on file  . Transportation needs:    Medical: Not on file    Non-medical: Not on file  Tobacco Use  . Smoking status: Former Smoker    Types: Cigarettes    Last attempt to quit: 01/27/2004    Years since quitting: 14.1  . Smokeless tobacco: Never Used  Substance and Sexual Activity  . Alcohol use: No  . Drug use: No  . Sexual activity: Yes    Partners: Male  Lifestyle  . Physical activity:    Days per week: Not on file    Minutes per session: Not on file  . Stress: Not on file  Relationships  . Social connections:    Talks on phone: Not on file    Gets together: Not on file    Attends religious service: Not on file    Active member of club or organization: Not on file    Attends meetings of clubs or organizations: Not on file    Relationship status: Not on file  Other Topics Concern  . Not on file  Social History Narrative  . Not on  file     Family History: The patient's family history includes Autism spectrum disorder in her daughter; Breast cancer (age of onset: 63) in her maternal aunt; Chiari malformation in her sister; Colon cancer in her maternal uncle; Colon cancer (age of onset: 54) in her mother; Colon cancer (age of onset: 40) in her maternal grandmother; Colon cancer (age of onset: 9) in an other family member; Colon polyps in her mother; Diabetes in her paternal grandmother; Heart attack in her maternal grandfather; Hypertension in her maternal grandfather; Lung cancer (age of onset: 69) in her father; Other in her daughter and maternal aunt; Ovarian cancer in her maternal aunt. She was adopted. ROS:   Please see the history of present illness.    All 14 point review of systems negative except as described per history of present illness  EKGs/Labs/Other Studies Reviewed:      Recent Labs: 11/11/2017: ALT 10; TSH 1.300 11/18/2017: BUN 22; Creatinine, Ser 0.77; Hemoglobin 12.3; Platelets 412; Potassium 3.6; Sodium 137  Recent Lipid Panel    Component Value Date/Time   CHOL 163 11/11/2017 1249   TRIG 128 11/11/2017 1249   HDL 41 11/11/2017 1249   LDLCALC 96 11/11/2017 1249    Physical Exam:    VS:  BP 116/68   Pulse 68   Ht 5\' 6"  (1.676 m)   Wt 260 lb (117.9 kg)   SpO2 98%   BMI 41.97 kg/m     Wt Readings from Last 3 Encounters:  03/05/18 260 lb (117.9 kg)  03/04/18 254 lb (115.2 kg)  02/16/18 262 lb (118.8 kg)     GEN:  Well nourished, well developed in no acute distress HEENT: Normal NECK: No JVD; No carotid bruits LYMPHATICS: No lymphadenopathy CARDIAC: RRR, no murmurs, no rubs, no gallops RESPIRATORY:  Clear to auscultation without rales, wheezing or rhonchi  ABDOMEN: Soft, non-tender, non-distended MUSCULOSKELETAL:  No edema; No deformity  SKIN: Warm and dry LOWER EXTREMITIES: no swelling NEUROLOGIC:  Alert and oriented x 3 PSYCHIATRIC:  Normal affect   ASSESSMENT:    1.  Precordial pain   2. Morbid obesity (HCC)   3. Family history of congenital heart defect   4. Heart murmur    PLAN:    In order of problems listed above:  1. Precordial pain.  He denies having any stress test  normal. 2. Obesity she is trying to work on weight loss. 3. Family history of congenital heart defect.  Noted. 4. Heart murmur I do not hear any echocardiogram normal.   Medication Adjustments/Labs and Tests Ordered: Current medicines are reviewed at length with the patient today.  Concerns regarding medicines are outlined above.  No orders of the defined types were placed in this encounter.  Medication changes: No orders of the defined types were placed in this encounter.   Signed, Georgeanna Leaobert J. Cap Massi, MD, [facility] HospitalFACC 03/05/2018 9:21 AM    [facility]

## 2018-03-06 ENCOUNTER — Ambulatory Visit: Payer: BLUE CROSS/BLUE SHIELD | Admitting: Rehabilitative and Restorative Service Providers"

## 2018-03-06 ENCOUNTER — Encounter: Payer: Self-pay | Admitting: Rehabilitative and Restorative Service Providers"

## 2018-03-06 DIAGNOSIS — M25562 Pain in left knee: Secondary | ICD-10-CM | POA: Diagnosis not present

## 2018-03-06 DIAGNOSIS — M6281 Muscle weakness (generalized): Secondary | ICD-10-CM

## 2018-03-06 DIAGNOSIS — R600 Localized edema: Secondary | ICD-10-CM | POA: Diagnosis not present

## 2018-03-06 DIAGNOSIS — M25662 Stiffness of left knee, not elsewhere classified: Secondary | ICD-10-CM | POA: Diagnosis not present

## 2018-03-06 DIAGNOSIS — R2689 Other abnormalities of gait and mobility: Secondary | ICD-10-CM

## 2018-03-06 NOTE — Therapy (Signed)
[facility] [address] [place], [place], [address] Phone: [phone]   Fax:  [phone]  Physical Therapy Treatment  Patient Details  Name: Doris MaxinHeather M Shannon MRN: [id number] Date of Birth: 1975-05-24 Referring Provider (PT): Dr. Ramond Marrowax Varkey   Encounter Date: 03/06/2018  PT End of Session - 03/06/18 1452    Visit Number  6    Number of Visits  24    Date for PT Re-Evaluation  04/14/18    PT Start Time  1448    PT Stop Time  1532    PT Time Calculation (min)  44 min    Activity Tolerance  Patient tolerated treatment well       Past Medical History:  Diagnosis Date  . ACL (anterior cruciate ligament) tear    MCL. LCL Left knee  . ADHD (attention deficit hyperactivity disorder)    husband  . Anemia   . B12 deficiency   . Blood clot in vein   . Chromosomal abnormality   . Congenital abnormality   . Constipation   . Headache   . HSV (herpes simplex virus) anogenital infection   . Joint pain   . Kidney stone on left side   . Lactose intolerance   . Miscarriage    x 8  . 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 Pinched nerve    neck/shoulder  . Plantar fasciitis   . Post partum depression     Past Surgical History:  Procedure Laterality Date  . arm surgery for fracture    . DILATION AND CURETTAGE OF UTERUS  1998  . KNEE SURGERY Left    torn menicus  . TONSILLECTOMY AND ADENOIDECTOMY    . WISDOM TOOTH EXTRACTION      There were no vitals filed for this visit.  Subjective Assessment - 03/06/18 1452    Subjective  Patient reports that she had a virus last week but that has resolved. She stepped in a hole about a week and a half ago and twisted her knee. Knee feels "spongy"; feels like the knee is still swollen and it feels more unstble - 'like it wants to bend backward'. Cardiologist cleared her to return to the gym. She has been doing 15 min/day on the bike.      Currently  in Pain?  Yes    Pain Score  3     Pain Location  Knee    Pain Orientation  Left    Pain Descriptors / Indicators  Aching    Pain Type  Acute pain;Surgical pain    Pain Onset  More than a month ago    Pain Frequency  Intermittent    Aggravating Factors   bending; walking     Pain Relieving Factors  rest;          [facility] PT Assessment - 03/06/18 0001      Assessment   Medical Diagnosis  Lt ACL reconstruction with hamstring graft    Referring Provider (PT)  Dr. Ramond Marrowax Varkey    Onset Date/Surgical Date  01/12/18      AROM   Left Knee Extension  0    Left Knee Flexion  123                   [facility] Adult PT Treatment/Exercise - 03/06/18 0001      Knee/Hip Exercises: Stretches   Passive Hamstring Stretch  Left;3 reps;30 seconds    Passive Hamstring  Stretch Limitations  with overpressure at distal quad; supine with strap    Quad Stretch  Left;4 reps;30 seconds      Knee/Hip Exercises: Aerobic   Recumbent Bike  L2 x 8 min      Knee/Hip Exercises: Standing   Lateral Step Up  Left;1 set;10 reps;Hand Hold: 2;Step Height: 4"   some pain   Wall Squat  10 reps;10 seconds    Wall Squat Limitations  0-45 deg back for wall slide    some pain      Knee/Hip Exercises: Supine   Quad Sets  Left    Quad Sets Limitations  5 sec hold with NMES x 8 min     Short Arc [employer]  Left    Short Arc Quad Sets Limitations  5 sec hold with NMES x 8 min                PT Short Term Goals - 01/20/18 1311      PT SHORT TERM GOAL #1   Title  independent with initial HEP    Status  New    Target Date  03/03/18      PT SHORT TERM GOAL #2   Title  improve AROM Lt knee 0-120 for improved mobility    Status  New    Target Date  03/03/18      PT SHORT TERM GOAL #3   Title  amb without deviations for improved function    Status  New    Target Date  03/03/18      PT SHORT TERM GOAL #4   Title  report pain < 3/10 with activity for improved function    Status  New    Target  Date  03/03/18        PT Long Term Goals - 01/20/18 1313      PT LONG TERM GOAL #1   Title  independent with advanced HEP    Status  New    Target Date  04/14/18      PT LONG TERM GOAL #2   Title  improve FOTO score to </= 38% limited for improved function    Status  New    Target Date  04/14/18      PT LONG TERM GOAL #3   Title  Lt knee AROM 0-140 for improved function and mobility    Status  New    Target Date  04/14/18      PT LONG TERM GOAL #4   Title  demonstrate improved LLE strength of 5/5 for improved function and return to regular exercise    Status  New    Target Date  04/14/18            Plan - 03/06/18 1452    Clinical Impression Statement  Increased pain Lt knee following twisting her knee in the yard last week. Patient c/o some pain with closed chain activities added today but tolerated - visible fatigue Lt LE. Continued with NMES with active quad contraction. Progress with strengthening as pt tolerates.     Rehab Potential  Good    PT Frequency  2x / week    PT Duration  12 weeks    PT Treatment/Interventions  ADLs/Self Care Home Management;Cryotherapy;Electrical Stimulation;Ultrasound;Traction;Moist Heat;Therapeutic activities;Therapeutic exercise;Patient/family education;Manual techniques;Taping;Dry needling;Passive range of motion    PT Next Visit Plan  continue NMES, progress into phase 2 as pt tolerates     PT Home Exercise Plan  Access Code: [email or web address]  Consulted and Agree with Plan of Care  Patient       Patient will benefit from skilled therapeutic intervention in order to improve the following deficits and impairments:  Abnormal gait, Increased muscle spasms, Pain, Decreased range of motion, Decreased strength, Impaired flexibility, Increased edema  Visit Diagnosis: Acute pain of left knee  Stiffness of left knee, not elsewhere classified  Localized edema  Muscle weakness (generalized)  Other abnormalities of gait and  mobility     Problem List Patient Active Problem List   Diagnosis Date Noted  . Heart murmur 03/05/2018  . Insulin resistance 02/04/2018  . Class 3 severe obesity with serious comorbidity and body mass index (BMI) of 40.0 to 44.9 in adult (HCC) 02/04/2018  . Chest pain 12/01/2017  . Psychological factors affecting morbid obesity (HCC) 07/08/2017  . Arthritis 06/18/2017  . Chronic foot pain 06/18/2017  . Family history of colon cancer 12/12/2015  . Plantar fasciitis, bilateral 08/01/2015  . Short interval between pregnancies affecting pregnancy, antepartum 10/01/2013  . Family history of congenital heart defect 07/17/2012  . Ureteral calculus, left 04/12/2011  . 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 Morbid obesity (HCC) 10/01/2010  . HSV 10/03/2006  . ATTENTION DEFICIT, W/HYPERACTIVITY 10/29/2005  . Lupus anticoagulant positive 10/29/2005    Hugo Lybrand Rober Minion PT, MPH  03/06/2018, 4:08 PM  [facility] [address] [place], [place], [address] Phone: [phone]   Fax:  [phone]  Name: FRED MCCANLESS MRN: [id number] Date of Birth: 1975/07/11

## 2018-03-08 ENCOUNTER — Other Ambulatory Visit (INDEPENDENT_AMBULATORY_CARE_PROVIDER_SITE_OTHER): Payer: Self-pay | Admitting: Bariatrics

## 2018-03-08 DIAGNOSIS — E559 Vitamin D deficiency, unspecified: Secondary | ICD-10-CM

## 2018-03-09 ENCOUNTER — Encounter: Payer: Self-pay | Admitting: Physical Therapy

## 2018-03-09 ENCOUNTER — Ambulatory Visit (INDEPENDENT_AMBULATORY_CARE_PROVIDER_SITE_OTHER): Payer: BLUE CROSS/BLUE SHIELD | Admitting: Physical Therapy

## 2018-03-09 DIAGNOSIS — M25562 Pain in left knee: Secondary | ICD-10-CM | POA: Diagnosis not present

## 2018-03-09 DIAGNOSIS — R600 Localized edema: Secondary | ICD-10-CM

## 2018-03-09 DIAGNOSIS — M25662 Stiffness of left knee, not elsewhere classified: Secondary | ICD-10-CM

## 2018-03-09 DIAGNOSIS — M6281 Muscle weakness (generalized): Secondary | ICD-10-CM | POA: Diagnosis not present

## 2018-03-09 DIAGNOSIS — R2689 Other abnormalities of gait and mobility: Secondary | ICD-10-CM

## 2018-03-09 NOTE — Therapy (Signed)
[facility] [place], [place], [address] Phone: [phone]   Fax:  [phone]  Physical Therapy Treatment  Patient Details  Name: [name] MRN: [id number] Date of Birth: 09-20-1975 Referring Provider (PT): Dr. Ophelia Charter   Encounter Date: 03/09/2018  PT End of Session - 03/09/18 0804    Visit Number  7    Number of Visits  24    Date for PT Re-Evaluation  04/14/18    PT Start Time  0718    PT Stop Time  0802    PT Time Calculation (min)  44 min    Activity Tolerance  Patient tolerated treatment well       Past Medical History:  Diagnosis Date  . ACL (anterior cruciate ligament) tear    MCL. LCL Left knee  . ADHD (attention deficit hyperactivity disorder)    husband  . Anemia   . B12 deficiency   . Blood clot in vein   . Chromosomal abnormality   . Congenital abnormality   . Constipation   . Headache   . HSV (herpes simplex virus) anogenital infection   . Joint pain   . Kidney stone on left side   . Lactose intolerance   . Miscarriage    x 8  . MTHFR (methylene THF reductase) deficiency and homocystinuria ([place])   . Pinched nerve    neck/shoulder  . Plantar fasciitis   . Post partum depression     Past Surgical History:  Procedure Laterality Date  . arm surgery for fracture    . DILATION AND CURETTAGE OF UTERUS  1998  . KNEE SURGERY Left    torn menicus  . TONSILLECTOMY AND ADENOIDECTOMY    . WISDOM TOOTH EXTRACTION      There were no vitals filed for this visit.  Subjective Assessment - 03/09/18 0718    Subjective  doing well; a little sore after last time.     Patient Stated Goals  return to lifting weights    Currently in Pain?  Yes    Pain Score  0-No pain    Pain Location  Knee    Pain Orientation  Left    Pain Onset  More than a month ago         [facility] PT Assessment - 03/09/18 0725      Assessment   Medical Diagnosis  Lt ACL reconstruction with hamstring  graft    Referring Provider (PT)  Dr. Ophelia Charter    Onset Date/Surgical Date  01/12/18                   [facility] Adult PT Treatment/Exercise - 03/09/18 0723      Knee/Hip Exercises: Aerobic   Recumbent Bike  L3 x 8 min      Knee/Hip Exercises: Standing   Forward Step Up  Left;10 reps;Hand Hold: 1;Step Height: 4"    Forward Step Up Limitations  increased pain    SLS  LLE: with RLE extension and abduction      Knee/Hip Exercises: Seated   Long Arc Quad  Left;10 reps    Long Arc Quad Limitations  5 sec hold; with ball squeeze (fatigue noted)    Stool Scoot - Round Trips  80'      Knee/Hip Exercises: Supine   Quad Sets  Left    Quad Sets Limitations  5 sec hold with NMES x 7 min    Short Arc [employer]  Left    Short Arc Quad Sets Limitations  5 sec hold with NMES x 6 min    Straight Leg Raise with External Rotation  Left    Straight Leg Raise with External Rotation Limitations  with NMES x 7 min      Electrical Stimulation   Electrical Stimulation Location  Lt VMO/quad    Electrical Stimulation Action  symmetric biphasic    Electrical Stimulation Parameters  5 sec on/5 sec off up to 33 mA    Electrical Stimulation Goals  Strength               PT Short Term Goals - 03/09/18 0804      PT SHORT TERM GOAL #1   Title  independent with initial HEP    Status  Achieved    Target Date  03/03/18      PT SHORT TERM GOAL #2   Title  improve AROM Lt knee 0-120 for improved mobility    Status  On-going    Target Date  03/03/18      PT SHORT TERM GOAL #3   Title  amb without deviations for improved function    Status  Achieved    Target Date  03/03/18      PT SHORT TERM GOAL #4   Title  report pain < 3/10 with activity for improved function    Baseline  2/17: 2/10 with activity    Status  Achieved    Target Date  03/03/18        PT Long Term Goals - 01/20/18 1313      PT LONG TERM GOAL #1   Title  independent with advanced HEP    Status  New     Target Date  04/14/18      PT LONG TERM GOAL #2   Title  improve FOTO score to </= 38% limited for improved function    Status  New    Target Date  04/14/18      PT LONG TERM GOAL #3   Title  Lt knee AROM 0-140 for improved function and mobility    Status  New    Target Date  04/14/18      PT LONG TERM GOAL #4   Title  demonstrate improved LLE strength of 5/5 for improved function and return to regular exercise    Status  New    Target Date  04/14/18            Plan - 03/09/18 0805    Clinical Impression Statement  Pt has met 3/4 STGs at this time and is progressing well with PT.  Will continue to benefit from PT to maximize function.    Rehab Potential  Good    PT Frequency  2x / week    PT Duration  12 weeks    PT Treatment/Interventions  ADLs/Self Care Home Management;Cryotherapy;Electrical Stimulation;Ultrasound;Traction;Moist Heat;Therapeutic activities;Therapeutic exercise;Patient/family education;Manual techniques;Taping;Dry needling;Passive range of motion    PT Next Visit Plan  continue NMES, progress into phase 2 as pt tolerates     PT Home Exercise Plan  Access Code: [email or web address]    Consulted and Agree with Plan of Care  Patient       Patient will benefit from skilled therapeutic intervention in order to improve the following deficits and impairments:  Abnormal gait, Increased muscle spasms, Pain, Decreased range of motion, Decreased strength, Impaired flexibility, Increased edema  Visit Diagnosis: Acute pain of left knee  Stiffness of left  knee, not elsewhere classified  Localized edema  Muscle weakness (generalized)  Other abnormalities of gait and mobility     Problem List Patient Active Problem List   Diagnosis Date Noted  . Heart murmur 03/05/2018  . Insulin resistance 02/04/2018  . Class 3 severe obesity with serious comorbidity and body mass index (BMI) of 40.0 to 44.9 in adult ([place]) 02/04/2018  . Chest pain 12/01/2017  . Psychological factors  affecting morbid obesity ([place]) 07/08/2017  . Arthritis 06/18/2017  . Chronic foot pain 06/18/2017  . Family history of colon cancer 12/12/2015  . Plantar fasciitis, bilateral 08/01/2015  . Short interval between pregnancies affecting pregnancy, antepartum 10/01/2013  . Family history of congenital heart defect 07/17/2012  . Ureteral calculus, left 04/12/2011  . 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 Morbid obesity ([place]) 10/01/2010  . HSV 10/03/2006  . ATTENTION DEFICIT, W/HYPERACTIVITY 10/29/2005  . Lupus anticoagulant positive 10/29/2005      Laureen Abrahams, PT, DPT 03/09/18 8:07 AM     [facility] [place], [place], [address] Phone: [phone]   Fax:  [phone]  Name: [name] MRN: [id number] Date of Birth: 26-Dec-1975

## 2018-03-10 ENCOUNTER — Other Ambulatory Visit (INDEPENDENT_AMBULATORY_CARE_PROVIDER_SITE_OTHER): Payer: Self-pay | Admitting: Bariatrics

## 2018-03-10 DIAGNOSIS — F3289 Other specified depressive episodes: Secondary | ICD-10-CM

## 2018-03-11 ENCOUNTER — Encounter: Payer: BLUE CROSS/BLUE SHIELD | Admitting: Physical Therapy

## 2018-03-16 ENCOUNTER — Ambulatory Visit: Payer: BLUE CROSS/BLUE SHIELD | Admitting: Physical Therapy

## 2018-03-16 ENCOUNTER — Encounter: Payer: Self-pay | Admitting: Physical Therapy

## 2018-03-16 ENCOUNTER — Encounter: Payer: BLUE CROSS/BLUE SHIELD | Admitting: Rehabilitative and Restorative Service Providers"

## 2018-03-16 DIAGNOSIS — M25662 Stiffness of left knee, not elsewhere classified: Secondary | ICD-10-CM | POA: Diagnosis not present

## 2018-03-16 DIAGNOSIS — R2689 Other abnormalities of gait and mobility: Secondary | ICD-10-CM

## 2018-03-16 DIAGNOSIS — M25562 Pain in left knee: Secondary | ICD-10-CM

## 2018-03-16 DIAGNOSIS — M6281 Muscle weakness (generalized): Secondary | ICD-10-CM

## 2018-03-16 DIAGNOSIS — R600 Localized edema: Secondary | ICD-10-CM

## 2018-03-16 NOTE — Therapy (Signed)
[facility] [address] [place], [place], [address] Phone: [phone]   Fax:  [phone]  Physical Therapy Treatment  Patient Details  Name: [name] MRN: [id number] Date of Birth: 04-23-1975 Referring Provider (PT): Dr. Ramond Marrow   Encounter Date: 03/16/2018  PT End of Session - 03/16/18 1028    Visit Number  8    Number of Visits  24    Date for PT Re-Evaluation  04/14/18    PT Start Time  0930    PT Stop Time  1013    PT Time Calculation (min)  43 min    Activity Tolerance  Patient tolerated treatment well    Behavior During Therapy  [facility] for tasks assessed/performed       Past Medical History:  Diagnosis Date  . ACL (anterior cruciate ligament) tear    MCL. LCL Left knee  . ADHD (attention deficit hyperactivity disorder)    husband  . Anemia   . B12 deficiency   . Blood clot in vein   . Chromosomal abnormality   . Congenital abnormality   . Constipation   . Headache   . HSV (herpes simplex virus) anogenital infection   . Joint pain   . Kidney stone on left side   . Lactose intolerance   . Miscarriage    x 8  . 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 Pinched nerve    neck/shoulder  . Plantar fasciitis   . Post partum depression     Past Surgical History:  Procedure Laterality Date  . arm surgery for fracture    . DILATION AND CURETTAGE OF UTERUS  1998  . KNEE SURGERY Left    torn menicus  . TONSILLECTOMY AND ADENOIDECTOMY    . WISDOM TOOTH EXTRACTION      There were no vitals filed for this visit.  Subjective Assessment - 03/16/18 0929    Subjective  knee feels great; has intermittent "shooting pains" but resolve quickly.      Patient Stated Goals  return to lifting weights    Currently in Pain?  Yes    Pain Score  1     Pain Location  Knee    Pain Orientation  Left    Pain Descriptors / Indicators  Aching;Shooting    Pain Type  Acute  pain;Surgical pain    Pain Onset  More than a month ago    Pain Frequency  Intermittent    Aggravating Factors   bending, walking    Pain Relieving Factors  rest                       [facility] Adult PT Treatment/Exercise - 03/16/18 0931      Knee/Hip Exercises: Aerobic   Recumbent Bike  L3 x 8 min      Knee/Hip Exercises: Standing   Hip Abduction  Both;10 reps;Knee straight    Abduction Limitations  green theraband    Hip Extension  Both;10 reps;Knee straight    Extension Limitations  green theraband      Knee/Hip Exercises: Supine   Quad Sets  Left    Quad Sets Limitations  10 sec hold with NMES x 7 min    Short Arc Quad Sets  Left    Short Arc Quad Sets Limitations  10 sec hold with NMES x 7 min    Straight Leg Raise with External Rotation  Left  Straight Leg Raise with External Rotation Limitations  with NMES x 6 min      Electrical Stimulation   Electrical Stimulation Location  Lt VMO/quad    Electrical Stimulation Action  NMES    Electrical Stimulation Parameters  10 sec on/10 sec off    Electrical Stimulation Goals  Strength             PT Education - 03/16/18 1028    Education Details  updated HEP    Person(s) Educated  Patient    Methods  Explanation;Demonstration;Handout    Comprehension  Verbalized understanding;Returned demonstration;Need further instruction       PT Short Term Goals - 03/09/18 0804      PT SHORT TERM GOAL #1   Title  independent with initial HEP    Status  Achieved    Target Date  03/03/18      PT SHORT TERM GOAL #2   Title  improve AROM Lt knee 0-120 for improved mobility    Status  On-going    Target Date  03/03/18      PT SHORT TERM GOAL #3   Title  amb without deviations for improved function    Status  Achieved    Target Date  03/03/18      PT SHORT TERM GOAL #4   Title  report pain < 3/10 with activity for improved function    Baseline  2/17: 2/10 with activity    Status  Achieved    Target Date   03/03/18        PT Long Term Goals - 01/20/18 1313      PT LONG TERM GOAL #1   Title  independent with advanced HEP    Status  New    Target Date  04/14/18      PT LONG TERM GOAL #2   Title  improve FOTO score to </= 38% limited for improved function    Status  New    Target Date  04/14/18      PT LONG TERM GOAL #3   Title  Lt knee AROM 0-140 for improved function and mobility    Status  New    Target Date  04/14/18      PT LONG TERM GOAL #4   Title  demonstrate improved LLE strength of 5/5 for improved function and return to regular exercise    Status  New    Target Date  04/14/18            Plan - 03/16/18 1029    Clinical Impression Statement  Session today focused on continued quad strengthening and control, using demo home NMES unit for potential for home use.  Pt with positive response to home NMES unit and plan to look into acquiring for home use.  Progressing well with PT.    Rehab Potential  Good    PT Frequency  2x / week    PT Duration  12 weeks    PT Treatment/Interventions  ADLs/Self Care Home Management;Cryotherapy;Electrical Stimulation;Ultrasound;Traction;Moist Heat;Therapeutic activities;Therapeutic exercise;Patient/family education;Manual techniques;Taping;Dry needling;Passive range of motion    PT Next Visit Plan  continue NMES, progress into phase 2 as pt tolerates, measure ROM    PT Home Exercise Plan  Access Code: [email or web address]    Consulted and Agree with Plan of Care  Patient       Patient will benefit from skilled therapeutic intervention in order to improve the following deficits and impairments:  Abnormal gait, Increased muscle spasms, Pain,  Decreased range of motion, Decreased strength, Impaired flexibility, Increased edema  Visit Diagnosis: Acute pain of left knee  Stiffness of left knee, not elsewhere classified  Localized edema  Muscle weakness (generalized)  Other abnormalities of gait and mobility     Problem List Patient  Active Problem List   Diagnosis Date Noted  . Heart murmur 03/05/2018  . Insulin resistance 02/04/2018  . Class 3 severe obesity with serious comorbidity and body mass index (BMI) of 40.0 to 44.9 in adult (HCC) 02/04/2018  . Chest pain 12/01/2017  . Psychological factors affecting morbid obesity (HCC) 07/08/2017  . Arthritis 06/18/2017  . Chronic foot pain 06/18/2017  . Family history of colon cancer 12/12/2015  . Plantar fasciitis, bilateral 08/01/2015  . Short interval between pregnancies affecting pregnancy, antepartum 10/01/2013  . Family history of congenital heart defect 07/17/2012  . Ureteral calculus, left 04/12/2011  . 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 Morbid obesity (HCC) 10/01/2010  . HSV 10/03/2006  . ATTENTION DEFICIT, W/HYPERACTIVITY 10/29/2005  . Lupus anticoagulant positive 10/29/2005      Clarita Crane, PT, DPT 03/16/18 10:33 AM     [facility] [address] [place], [place], [address] Phone: [phone]   Fax:  [phone]  Name: [name] MRN: [id number] Date of Birth: Mar 27, 1975

## 2018-03-16 NOTE — Patient Instructions (Signed)
Access Code: [email or web address]  URL: [email or web address]  Date: 03/16/2018  Prepared by: Moshe Cipro   Exercises  Supine Knee Flexion Wall Slide - 5 min - 3x daily - 7x weekly  Supine Short Arc Quad - 10 reps - 3 sets - 5 sec hold - 1x daily - 7x weekly  Small Range Straight Leg Raise - 10 reps - 3 sets - 3-5 sec hold - 3x daily - 7x weekly  Sidelying Hip Abduction - 3 sets - 10 reps - 3x daily - 7x weekly  Sidelying Hip Adduction - 10 reps - 3 sets - 3x daily - 7x weekly  Prone Hip Extension - 10 reps - 3 sets - 3x daily - 7x weekly  Seated Long Arc Quad - 10 reps - 1 sets - 3x daily - 7x weekly  Wall Quarter Squat - 10 reps - 1 sets - 5-10 sec hold - 3x daily - 7x weekly  Standing Hip Abduction Kicks - 10 reps - 1 sets - 3x daily - 7x weekly  Standing Hip Extension Kicks - 10 reps - 1 sets - 3x daily - 7x weekly

## 2018-03-18 ENCOUNTER — Encounter: Payer: Self-pay | Admitting: Rehabilitative and Restorative Service Providers"

## 2018-03-18 ENCOUNTER — Ambulatory Visit: Payer: BLUE CROSS/BLUE SHIELD | Admitting: Rehabilitative and Restorative Service Providers"

## 2018-03-18 ENCOUNTER — Encounter: Payer: BLUE CROSS/BLUE SHIELD | Admitting: Rehabilitative and Restorative Service Providers"

## 2018-03-18 DIAGNOSIS — R600 Localized edema: Secondary | ICD-10-CM | POA: Diagnosis not present

## 2018-03-18 DIAGNOSIS — R2689 Other abnormalities of gait and mobility: Secondary | ICD-10-CM

## 2018-03-18 DIAGNOSIS — M25562 Pain in left knee: Secondary | ICD-10-CM | POA: Diagnosis not present

## 2018-03-18 DIAGNOSIS — M6281 Muscle weakness (generalized): Secondary | ICD-10-CM

## 2018-03-18 DIAGNOSIS — M25662 Stiffness of left knee, not elsewhere classified: Secondary | ICD-10-CM | POA: Diagnosis not present

## 2018-03-18 NOTE — Therapy (Signed)
[facility] [place] [address] [place], [place], [address] Phone: [phone]   Fax:  [phone]  Physical Therapy Treatment  Patient Details  Name: [name] MRN: [id number] Date of Birth: March 24, 1975 Referring Provider (PT): Dr. Ramond Marrow   Encounter Date: 03/18/2018  PT End of Session - 03/18/18 1104    Visit Number  9    Number of Visits  24    Date for PT Re-Evaluation  04/14/18    PT Start Time  1102    PT Stop Time  1148    PT Time Calculation (min)  46 min    Activity Tolerance  Patient tolerated treatment well       Past Medical History:  Diagnosis Date  . ACL (anterior cruciate ligament) tear    MCL. LCL Left knee  . ADHD (attention deficit hyperactivity disorder)    husband  . Anemia   . B12 deficiency   . Blood clot in vein   . Chromosomal abnormality   . Congenital abnormality   . Constipation   . Headache   . HSV (herpes simplex virus) anogenital infection   . Joint pain   . Kidney stone on left side   . Lactose intolerance   . Miscarriage    x 8  . 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 Pinched nerve    neck/shoulder  . Plantar fasciitis   . Post partum depression     Past Surgical History:  Procedure Laterality Date  . arm surgery for fracture    . DILATION AND CURETTAGE OF UTERUS  1998  . KNEE SURGERY Left    torn menicus  . TONSILLECTOMY AND ADENOIDECTOMY    . WISDOM TOOTH EXTRACTION      There were no vitals filed for this visit.  Subjective Assessment - 03/18/18 1104    Subjective  Knee is feeling much better. She has ful ROm and is getting stronger. She had no pain with the rain yesterday. She has some pain with step ups but can work through it.     Currently in Pain?  No/denies                       [facility] Adult PT Treatment/Exercise - 03/18/18 0001      Knee/Hip Exercises: Aerobic   Recumbent Bike  L3 x 8 min      Knee/Hip Exercises: Standing   Hip Abduction  Both;10 reps;Knee straight    Hip Extension  Both;10 reps;Knee straight    Forward Step Up  Left;2 sets;10 reps;Hand Hold: 1;Step Height: 6"      Knee/Hip Exercises: Supine   Quad Sets  Left    Quad Sets Limitations  w/ NMES     Short Arc Quad Sets  Left    Short Arc Quad Sets Limitations  w/ NMES     Straight Leg Raise with External Rotation  Left    Straight Leg Raise with External Rotation Limitations  w/ NMES     Other Supine Knee/Hip Exercises  supine clams w/ black TB x 10 each LE in hooklying       Electrical Stimulation   Electrical Stimulation Location  Lt VMO/quad    Electrical Stimulation Action  NMES     Electrical Stimulation Parameters  10 sec on/10sec off     Electrical Stimulation Goals  Strength  PT Short Term Goals - 03/09/18 0804      PT SHORT TERM GOAL #1   Title  independent with initial HEP    Status  Achieved    Target Date  03/03/18      PT SHORT TERM GOAL #2   Title  improve AROM Lt knee 0-120 for improved mobility    Status  On-going    Target Date  03/03/18      PT SHORT TERM GOAL #3   Title  amb without deviations for improved function    Status  Achieved    Target Date  03/03/18      PT SHORT TERM GOAL #4   Title  report pain < 3/10 with activity for improved function    Baseline  2/17: 2/10 with activity    Status  Achieved    Target Date  03/03/18        PT Long Term Goals - 01/20/18 1313      PT LONG TERM GOAL #1   Title  independent with advanced HEP    Status  New    Target Date  04/14/18      PT LONG TERM GOAL #2   Title  improve FOTO score to </= 38% limited for improved function    Status  New    Target Date  04/14/18      PT LONG TERM GOAL #3   Title  Lt knee AROM 0-140 for improved function and mobility    Status  New    Target Date  04/14/18      PT LONG TERM GOAL #4   Title  demonstrate improved LLE strength of 5/5 for improved function and  return to regular exercise    Status  New    Target Date  04/14/18            Plan - 03/18/18 1104    Clinical Impression Statement  Continued progress with decreased pain and improved strength/exercise tolerance. Will oder home NMES unit. Continued progress toward stated goals of therapy.     Rehab Potential  Good    PT Frequency  2x / week    PT Duration  12 weeks    PT Treatment/Interventions  ADLs/Self Care Home Management;Cryotherapy;Electrical Stimulation;Ultrasound;Traction;Moist Heat;Therapeutic activities;Therapeutic exercise;Patient/family education;Manual techniques;Taping;Dry needling;Passive range of motion    PT Next Visit Plan  continue NMES, progress into phase 2 as pt tolerates, measure ROM    PT Home Exercise Plan  Access Code: [email or web address]    Consulted and Agree with Plan of Care  Patient       Patient will benefit from skilled therapeutic intervention in order to improve the following deficits and impairments:  Abnormal gait, Increased muscle spasms, Pain, Decreased range of motion, Decreased strength, Impaired flexibility, Increased edema  Visit Diagnosis: Acute pain of left knee  Stiffness of left knee, not elsewhere classified  Localized edema  Muscle weakness (generalized)  Other abnormalities of gait and mobility     Problem List Patient Active Problem List   Diagnosis Date Noted  . Heart murmur 03/05/2018  . Insulin resistance 02/04/2018  . Class 3 severe obesity with serious comorbidity and body mass index (BMI) of 40.0 to 44.9 in adult (HCC) 02/04/2018  . Chest pain 12/01/2017  . Psychological factors affecting morbid obesity (HCC) 07/08/2017  . Arthritis 06/18/2017  . Chronic foot pain 06/18/2017  . Family history of colon cancer 12/12/2015  . Plantar fasciitis, bilateral 08/01/2015  . Short interval between  pregnancies affecting pregnancy, antepartum 10/01/2013  . Family history of congenital heart defect 07/17/2012  . Ureteral calculus,  left 04/12/2011  . 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 Morbid obesity (HCC) 10/01/2010  . HSV 10/03/2006  . ATTENTION DEFICIT, W/HYPERACTIVITY 10/29/2005  . Lupus anticoagulant positive 10/29/2005    Greenley Martone Rober MinionP Tiernan Millikin PT, MPH  03/18/2018, 11:45 AM  [facility] [address] [place], [place], [address] Phone: [phone]   Fax:  [phone]  Name: Duwaine MaxinHeather M Kleen MRN: [id number] Date of Birth: Nov 03, 1975

## 2018-03-23 ENCOUNTER — Encounter (INDEPENDENT_AMBULATORY_CARE_PROVIDER_SITE_OTHER): Payer: Self-pay | Admitting: Bariatrics

## 2018-03-23 ENCOUNTER — Encounter: Payer: BLUE CROSS/BLUE SHIELD | Admitting: Physical Therapy

## 2018-03-23 ENCOUNTER — Ambulatory Visit (INDEPENDENT_AMBULATORY_CARE_PROVIDER_SITE_OTHER): Payer: BLUE CROSS/BLUE SHIELD | Admitting: Bariatrics

## 2018-03-23 VITALS — BP 117/83 | HR 82 | Temp 98.1°F | Ht 66.0 in | Wt 253.0 lb

## 2018-03-23 DIAGNOSIS — Z9189 Other specified personal risk factors, not elsewhere classified: Secondary | ICD-10-CM | POA: Diagnosis not present

## 2018-03-23 DIAGNOSIS — Z87898 Personal history of other specified conditions: Secondary | ICD-10-CM | POA: Diagnosis not present

## 2018-03-23 DIAGNOSIS — F3289 Other specified depressive episodes: Secondary | ICD-10-CM | POA: Diagnosis not present

## 2018-03-23 DIAGNOSIS — E559 Vitamin D deficiency, unspecified: Secondary | ICD-10-CM | POA: Diagnosis not present

## 2018-03-23 DIAGNOSIS — E8881 Metabolic syndrome: Secondary | ICD-10-CM | POA: Diagnosis not present

## 2018-03-23 DIAGNOSIS — Z6841 Body Mass Index (BMI) 40.0 and over, adult: Secondary | ICD-10-CM

## 2018-03-23 NOTE — Progress Notes (Signed)
Office: [phone]  /  Fax: [phone]   HPI:   Chief Complaint: OBESITY [name] is here to discuss her progress with her obesity treatment plan. She is on the keep a food journal with 450 to 600 calories and 40 grams of protein at supper daily and the Category 3 plan and is following her eating plan approximately 85 % of the time. She states she is exercising 0 minutes 0 times per week. [name] has struggled over the weekend. She is using smaller bowls and she is getting plenty of protein. Her weight is 253 lb (114.8 kg) today and has had a weight loss of 1 pound over a period of 2 to 3 weeks since her last visit. She has gained 3 lbs since starting treatment with [place].  Insulin Resistance [name] has a diagnosis of insulin resistance based on her elevated fasting insulin level >5. Although [name]'s blood glucose readings are still under good control, insulin resistance puts her at greater risk of metabolic syndrome and diabetes. [name] is "always hungry". She is not taking metformin currently and continues to work on diet and exercise to decrease risk of diabetes.  History of Syncope [name] has a history of syncope. She saw a cardiologist. No action is needed at this time.  Vitamin D deficiency [name] has a diagnosis of vitamin D deficiency. She is not currently taking vit D and denies nausea, vomiting or muscle weakness.  At risk for osteopenia and osteoporosis [name] is at higher risk of osteopenia and osteoporosis due to vitamin D deficiency.   Depression with emotional eating behaviors [name] has not started Wellbutrin and she does not want to start. [name] struggles with emotional eating and using food for comfort to the extent that it is negatively impacting her health. She often snacks when she is not hungry. [name] sometimes feels she is out of control and then feels guilty that she made poor food choices. She has been working on behavior modification techniques to help  reduce her emotional eating and has been somewhat successful. She shows no sign of suicidal or homicidal ideations.  Depression screen [name] 2/9 11/11/2017 11/05/2017 10/03/2016  Decreased Interest 1 0 0  Down, Depressed, Hopeless 1 0 0  PHQ - 2 Score 2 0 0  Altered sleeping 1 - -  Tired, decreased energy 2 - -  Change in appetite 1 - -  Feeling bad or failure about yourself  0 - -  Trouble concentrating 0 - -  Moving slowly or fidgety/restless 0 - -  Suicidal thoughts 0 - -  PHQ-9 Score 6 - -  Difficult doing work/chores Not difficult at all - -     ASSESSMENT AND PLAN:  Insulin resistance - Plan: Comprehensive metabolic panel, Hemoglobin A1c, Insulin, random  Vitamin D deficiency  Other depression - with emotional eating  History of syncope  At risk for osteoporosis  Class 3 severe obesity with serious comorbidity and body mass index (BMI) of 40.0 to 44.9 in adult, unspecified obesity type (HCC)  PLAN:  Insulin Resistance [name] will continue to work on weight loss, exercise, and decreasing simple carbohydrates in her diet to help decrease the risk of diabetes. She was informed that eating too many simple carbohydrates or too many calories at one sitting increases the likelihood of GI side effects. We will check Hgb A1c and insulin level today. [name] agreed to follow up with [place] as directed to monitor her progress.  History of Syncope [name] will follow up with cardiologist  as needed. She will follow up with our clinic In 2 weeks.  Vitamin D Deficiency [name] was informed that low vitamin D levels contributes to fatigue and are associated with obesity, breast, and colon cancer. We will check vitamin D level today and she will follow up for routine testing of vitamin D, at least 2-3 times per year.    At risk for osteopenia and osteoporosis [name] was given extended  (15 minutes) osteoporosis prevention counseling today. [name] is at risk for osteopenia and  osteoporsis due to her vitamin D deficiency. She was encouraged to take her vitamin D and follow her higher calcium diet and increase strengthening exercise to help strengthen her bones and decrease her risk of osteopenia and osteoporosis.  Depression with Emotional Eating Behaviors We discussed behavior modification techniques today to help [name] deal with her emotional eating and depression. She will not begin Wellbutrin. She -agreed to follow up as directed.  Obesity [name] is currently in the action stage of change. As such, her goal is to continue with weight loss efforts She has agreed to keep a food journal with 1500 to 1800 calories and 85 grams of protein daily [name] has been instructed to work up to a goal of 150 minutes of combined cardio and strengthening exercise per week for weight loss and overall health benefits. We discussed the following Behavioral Modification Strategies today: increase H2O intake, no skipping meals, keeping healthy foods in the home, increasing lean protein intake, decreasing simple carbohydrates, increasing vegetables, decrease eating out and work on meal planning and easy cooking plans [name] will keep the calorie content down to around 1,500 calories.  [name] has agreed to follow up with our clinic in 2 weeks. She was informed of the importance of frequent follow up visits to maximize her success with intensive lifestyle modifications for her multiple health conditions.  ALLERGIES: Allergies  Allergen Reactions  . Dairy Aid [Lactase] Other (See Comments)    Excess gas, bloating, diarrhea  . Nickel     MEDICATIONS: Current Outpatient Medications on File Prior to Visit  Medication Sig Dispense Refill  . acetaminophen (TYLENOL 8 HOUR) 650 MG CR tablet Take 1 tablet by mouth as needed.    Marland Kitchen buPROPion (WELLBUTRIN SR) 150 MG 12 hr tablet Take 1 tablet (150 mg total) by mouth daily. (Patient not taking: Reported on 03/05/2018) 30 tablet 0  .  diclofenac sodium (VOLTAREN) 1 % GEL Apply topically as needed.    . Meloxicam 7.5 MG TBDP Take 7.5 mg by mouth as needed.     . Multiple Vitamin (MULTIVITAMIN) tablet Take 1 tablet by mouth daily.     No current facility-administered medications on file prior to visit.     PAST MEDICAL HISTORY: Past Medical History:  Diagnosis Date  . ACL (anterior cruciate ligament) tear    MCL. LCL Left knee  . ADHD (attention deficit hyperactivity disorder)    husband  . Anemia   . B12 deficiency   . Blood clot in vein   . Chromosomal abnormality   . Congenital abnormality   . Constipation   . Headache   . HSV (herpes simplex virus) anogenital infection   . Joint pain   . Kidney stone on left side   . Lactose intolerance   . Miscarriage    x 8  . 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 Pinched nerve    neck/shoulder  . Plantar fasciitis   . Post partum depression  PAST SURGICAL HISTORY: Past Surgical History:  Procedure Laterality Date  . arm surgery for fracture    . DILATION AND CURETTAGE OF UTERUS  1998  . KNEE SURGERY Left    torn menicus  . TONSILLECTOMY AND ADENOIDECTOMY    . WISDOM TOOTH EXTRACTION      SOCIAL HISTORY: Social History   Tobacco Use  . Smoking status: Former Smoker    Types: Cigarettes    Last attempt to quit: 01/27/2004    Years since quitting: 14.1  . Smokeless tobacco: Never Used  Substance Use Topics  . Alcohol use: No  . Drug use: No    FAMILY HISTORY: Family History  Adopted: Yes  Problem Relation Age of Onset  . Autism spectrum disorder Daughter   . Other Daughter        Pancreatic insufficiency.   . Colon cancer Mother 39       died at age 49  . Colon polyps Mother   . Colon cancer Maternal Grandmother 44  . Colon cancer Other 89       mat great grand mother  . Lung cancer Father 15       smoker  . Heart attack Maternal Grandfather   . Hypertension Maternal Grandfather   . Diabetes Paternal  Grandmother        type 1  . Breast cancer Maternal Aunt 20  . Ovarian cancer Maternal Aunt   . Other Maternal Aunt        reproductive issues  . Colon cancer Maternal Uncle   . Chiari malformation Sister     ROS: Review of Systems  Constitutional: Positive for weight loss.  Endo/Heme/Allergies:       Positive for polyphagia  Psychiatric/Behavioral: Positive for depression. Negative for suicidal ideas.    PHYSICAL EXAM: Blood pressure 117/83, pulse 82, temperature 98.1 F (36.7 C), temperature source Oral, height 5\' 6"  (1.676 m), weight 253 lb (114.8 kg), SpO2 100 %, unknown if currently breastfeeding. Body mass index is 40.84 kg/m. Physical Exam Vitals signs reviewed.  Constitutional:      Appearance: Normal appearance. She is well-developed. She is obese.  Cardiovascular:     Rate and Rhythm: Normal rate.  Pulmonary:     Effort: Pulmonary effort is normal.  Musculoskeletal: Normal range of motion.  Skin:    General: Skin is warm and dry.  Neurological:     Mental Status: She is alert and oriented to person, place, and time.  Psychiatric:        Mood and Affect: Mood normal.        Behavior: Behavior normal.        Thought Content: Thought content does not include homicidal or suicidal ideation.     RECENT LABS AND TESTS: BMET    Component Value Date/Time   NA 137 11/18/2017 1652   NA 140 11/11/2017 1249   K 3.6 11/18/2017 1652   CL 104 11/18/2017 1652   CO2 25 11/18/2017 1652   GLUCOSE 95 11/18/2017 1652   BUN 22 (H) 11/18/2017 1652   BUN 11 11/11/2017 1249   CREATININE 0.77 11/18/2017 1652   CREATININE 0.71 10/03/2016 1152   CALCIUM 9.4 11/18/2017 1652   GFRNONAA >60 11/18/2017 1652   GFRNONAA 107 10/03/2016 1152   GFRAA >60 11/18/2017 1652   GFRAA 124 10/03/2016 1152   Lab Results  Component Value Date   HGBA1C 5.3 11/11/2017   HGBA1C 5.1 01/19/2010   Lab Results  Component Value Date  INSULIN 8.9 11/11/2017   CBC    Component Value  Date/Time   WBC 13.5 (H) 11/18/2017 1652   RBC 4.69 11/18/2017 1652   HGB 12.3 11/18/2017 1652   HGB 11.9 11/11/2017 1249   HCT 38.8 11/18/2017 1652   HCT 36.8 11/11/2017 1249   PLT 412 (H) 11/18/2017 1652   MCV 82.7 11/18/2017 1652   MCV 82 11/11/2017 1249   MCH 26.2 11/18/2017 1652   MCHC 31.7 11/18/2017 1652   RDW 14.7 11/18/2017 1652   RDW 14.5 11/11/2017 1249   LYMPHSABS 2.8 11/11/2017 1249   MONOABS 0.8 01/26/2017 1055   EOSABS 0.2 11/11/2017 1249   BASOSABS 0.1 11/11/2017 1249   Iron/TIBC/Ferritin/ %Sat    Component Value Date/Time   FERRITIN 24 12/08/2015 1418   Lipid Panel     Component Value Date/Time   CHOL 163 11/11/2017 1249   TRIG 128 11/11/2017 1249   HDL 41 11/11/2017 1249   LDLCALC 96 11/11/2017 1249   Hepatic Function Panel     Component Value Date/Time   PROT 7.5 11/11/2017 1249   ALBUMIN 4.3 11/11/2017 1249   AST 12 11/11/2017 1249   ALT 10 11/11/2017 1249   ALKPHOS 71 11/11/2017 1249   BILITOT 0.3 11/11/2017 1249      Component Value Date/Time   TSH 1.300 11/11/2017 1249   TSH 1.13 10/03/2016 1152   TSH 1.12 12/08/2015 1418   Results for RAINN, ZUPKO (MRN [id number]) as of 03/23/2018 14:29  Ref. Range 11/11/2017 12:49  Vitamin D, 25-Hydroxy Latest Ref Range: 30.0 - 100.0 ng/mL 38.8     OBESITY BEHAVIORAL INTERVENTION VISIT  Today's visit was # 7   Starting weight: 250 lbs Starting date: 11/11/2017 Today's weight : 253 lbs Today's date: 03/23/2018 Total lbs lost to date: 0    03/23/2018  Height  (1.676 m)  Weight 253 lb (114.8 kg)  BMI (Calculated) 40.85  BLOOD PRESSURE - SYSTOLIC 117  BLOOD PRESSURE - DIASTOLIC 83   Body Fat % 48.5 %  Total Body Water (lbs) 93.2 lbs    ASK: We discussed the diagnosis of obesity with [name] today and [name] agreed to give [place] permission to discuss obesity behavioral modification therapy today.  ASSESS: [name] has the diagnosis of obesity and her BMI today is 40.85 [name]  is in the action stage of change   ADVISE: [name] was educated on the multiple health risks of obesity as well as the benefit of weight loss to improve her health. She was advised of the need for long term treatment and the importance of lifestyle modifications to improve her current health and to decrease her risk of future health problems.  AGREE: Multiple dietary modification options and treatment options were discussed and  Elveta agreed to follow the recommendations documented in the above note.  ARRANGE: Chidera was educated on the importance of frequent visits to treat obesity as outlined per CMS and USPSTF guidelines and agreed to schedule her next follow up appointment today.  Cristi Loron, am acting as Energy manager for [employer]. Manson Passey, DO  I have reviewed the above documentation for accuracy and completeness, and I agree with the above. -Corinna Capra, DO

## 2018-03-24 ENCOUNTER — Encounter: Payer: Self-pay | Admitting: Physical Therapy

## 2018-03-24 ENCOUNTER — Ambulatory Visit: Payer: BLUE CROSS/BLUE SHIELD | Admitting: Physical Therapy

## 2018-03-24 DIAGNOSIS — M25562 Pain in left knee: Secondary | ICD-10-CM | POA: Diagnosis not present

## 2018-03-24 DIAGNOSIS — R600 Localized edema: Secondary | ICD-10-CM

## 2018-03-24 DIAGNOSIS — R2689 Other abnormalities of gait and mobility: Secondary | ICD-10-CM

## 2018-03-24 DIAGNOSIS — M6281 Muscle weakness (generalized): Secondary | ICD-10-CM

## 2018-03-24 DIAGNOSIS — M25662 Stiffness of left knee, not elsewhere classified: Secondary | ICD-10-CM

## 2018-03-24 LAB — COMPREHENSIVE METABOLIC PANEL
A/G RATIO: 1.5 (ref 1.2–2.2)
ALBUMIN: 4.4 g/dL (ref 3.8–4.8)
ALT: 14 IU/L (ref 0–32)
AST: 10 IU/L (ref 0–40)
Alkaline Phosphatase: 66 IU/L (ref 39–117)
BUN / CREAT RATIO: 18 (ref 9–23)
BUN: 13 mg/dL (ref 6–24)
Bilirubin Total: 0.4 mg/dL (ref 0.0–1.2)
CALCIUM: 9.4 mg/dL (ref 8.7–10.2)
CO2: 24 mmol/L (ref 20–29)
CREATININE: 0.73 mg/dL (ref 0.57–1.00)
Chloride: 106 mmol/L (ref 96–106)
GFR calc non Af Amer: 103 mL/min/{1.73_m2} (ref >59)
GFR, EST AFRICAN AMERICAN: 119 mL/min/{1.73_m2} (ref >59)
GLOBULIN, TOTAL: 2.9 g/dL (ref 1.5–4.5)
Glucose: 83 mg/dL (ref 65–99)
Potassium: 4.4 mmol/L (ref 3.5–5.2)
SODIUM: 142 mmol/L (ref 134–144)
TOTAL PROTEIN: 7.3 g/dL (ref 6.0–8.5)

## 2018-03-24 LAB — INSULIN, RANDOM: INSULIN: 15.4 u[IU]/mL (ref 2.6–24.9)

## 2018-03-24 LAB — VITAMIN D 25 HYDROXY (VIT D DEFICIENCY, FRACTURES): Vit D, 25-Hydroxy: 33 ng/mL (ref 30.0–100.0)

## 2018-03-24 LAB — HEMOGLOBIN A1C
ESTIMATED AVERAGE GLUCOSE: 100 mg/dL
Hgb A1c MFr Bld: 5.1 % (ref 4.8–5.6)

## 2018-03-24 NOTE — Therapy (Signed)
[facility] [place] [address] [place], [place], [address] Phone: [phone]   Fax:  [phone]  Physical Therapy Treatment  Patient Details  Name: [name] MRN: [id number] Date of Birth: [birth year] Referring Provider (PT): Dr. Ramond Marrow   Encounter Date: 03/24/2018  PT End of Session - 03/24/18 1252    Visit Number  10    Number of Visits  24    Date for PT Re-Evaluation  04/14/18    PT Start Time  1035    PT Stop Time  1114    PT Time Calculation (min)  39 min    Activity Tolerance  Patient tolerated treatment well       Past Medical History:  Diagnosis Date  . ACL (anterior cruciate ligament) tear    MCL. LCL Left knee  . ADHD (attention deficit hyperactivity disorder)    husband  . Anemia   . B12 deficiency   . Blood clot in vein   . Chromosomal abnormality   . Congenital abnormality   . Constipation   . Headache   . HSV (herpes simplex virus) anogenital infection   . Joint pain   . Kidney stone on left side   . Lactose intolerance   . Miscarriage    x 8  . 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 Pinched nerve    neck/shoulder  . Plantar fasciitis   . Post partum depression     Past Surgical History:  Procedure Laterality Date  . arm surgery for fracture    . DILATION AND CURETTAGE OF UTERUS  1998  . KNEE SURGERY Left    torn menicus  . TONSILLECTOMY AND ADENOIDECTOMY    . WISDOM TOOTH EXTRACTION      There were no vitals filed for this visit.  Subjective Assessment - 03/24/18 1036    Subjective  has been doing a lot of yard work and has a slight limp today.      Patient Stated Goals  return to lifting weights    Currently in Pain?  No/denies         [facility] PT Assessment - 03/24/18 1114      Assessment   Medical Diagnosis  Lt ACL reconstruction with hamstring graft    Referring Provider (PT)  Dr. Ramond Marrow      Observation/Other Assessments   Focus on Therapeutic Outcomes (FOTO)   48 (52% predicted)                   [facility] Adult PT Treatment/Exercise - 03/24/18 1037      Knee/Hip Exercises: Aerobic   Elliptical  L1.5 x 3:30    Recumbent Bike  L3 x 8 min      Knee/Hip Exercises: Standing   Terminal Knee Extension  Left    Theraband Level (Terminal Knee Extension)  Level 4 (Blue)    Terminal Knee Extension Limitations  with NMES x 5 min     SLS  LLE mini squat with RLE to 12:00/3:00/6:00 x 10 reps      Knee/Hip Exercises: Seated   Long Arc Quad  Left    Long Arc Quad Limitations  with NMES x 5 min    Sit to [employer]  --   LLE only with NMES x 5 min; elevated surface     Knee/Hip Exercises: Supine   Quad Sets  Left    Quad Sets Limitations  with NMES x 5 min  Emergency planning/management officer  Lt VMO/quad    Counsellor Parameters  10 sec on/10sec off     Clinical cytogeneticist               PT Short Term Goals - 03/09/18 0804      PT SHORT TERM GOAL #1   Title  independent with initial HEP    Status  Achieved    Target Date  03/03/18      PT SHORT TERM GOAL #2   Title  improve AROM Lt knee 0-120 for improved mobility    Status  On-going    Target Date  03/03/18      PT SHORT TERM GOAL #3   Title  amb without deviations for improved function    Status  Achieved    Target Date  03/03/18      PT SHORT TERM GOAL #4   Title  report pain < 3/10 with activity for improved function    Baseline  2/17: 2/10 with activity    Status  Achieved    Target Date  03/03/18        PT Long Term Goals - 01/20/18 1313      PT LONG TERM GOAL #1   Title  independent with advanced HEP    Status  New    Target Date  04/14/18      PT LONG TERM GOAL #2   Title  improve FOTO score to </= 38% limited for improved function    Status  New    Target Date  04/14/18      PT LONG TERM GOAL #3   Title  Lt knee  AROM 0-140 for improved function and mobility    Status  New    Target Date  04/14/18      PT LONG TERM GOAL #4   Title  demonstrate improved LLE strength of 5/5 for improved function and return to regular exercise    Status  New    Target Date  04/14/18            Plan - 03/24/18 1252    Clinical Impression Statement  Pt tolerated session well with improved VMO activation noted.  Continue to use NMES for quad strength.  Overall pain decreasing and acitivty increasing.  FOTO score improved 12% today.    Rehab Potential  Good    PT Frequency  2x / week    PT Duration  12 weeks    PT Treatment/Interventions  ADLs/Self Care Home Management;Cryotherapy;Electrical Stimulation;Ultrasound;Traction;Moist Heat;Therapeutic activities;Therapeutic exercise;Patient/family education;Manual techniques;Taping;Dry needling;Passive range of motion    PT Next Visit Plan  continue NMES, progress into phase 2 as pt tolerates, measure ROM    PT Home Exercise Plan  Access Code: [email or web address]    Consulted and Agree with Plan of Care  Patient       Patient will benefit from skilled therapeutic intervention in order to improve the following deficits and impairments:  Abnormal gait, Increased muscle spasms, Pain, Decreased range of motion, Decreased strength, Impaired flexibility, Increased edema  Visit Diagnosis: Acute pain of left knee  Stiffness of left knee, not elsewhere classified  Localized edema  Muscle weakness (generalized)  Other abnormalities of gait and mobility     Problem List Patient Active Problem List   Diagnosis Date Noted  . Heart murmur 03/05/2018  . Insulin resistance 02/04/2018  . Class 3  severe obesity with serious comorbidity and body mass index (BMI) of 40.0 to 44.9 in adult [facility]) 02/04/2018  . Chest pain 12/01/2017  . Psychological factors affecting morbid obesity (HCC) 07/08/2017  . Arthritis 06/18/2017  . Chronic foot pain 06/18/2017  . Family history of colon  cancer 12/12/2015  . Short interval between pregnancies affecting pregnancy, antepartum 10/01/2013  . Family history of congenital heart defect 07/17/2012  . Ureteral calculus, left 04/12/2011  . 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 HSV 10/03/2006  . ATTENTION DEFICIT, W/HYPERACTIVITY 10/29/2005  . Lupus anticoagulant positive 10/29/2005      Clarita Crane, PT, DPT 03/24/18 12:53 PM    [facility] [address] [place] [address] [place], [place], [address] Phone: [phone]   Fax:  [phone]  Name: JOHNNYE SANDFORD MRN: [id number] Date of Birth: 1975/06/10

## 2018-03-27 ENCOUNTER — Ambulatory Visit (INDEPENDENT_AMBULATORY_CARE_PROVIDER_SITE_OTHER): Payer: BLUE CROSS/BLUE SHIELD | Admitting: Physical Therapy

## 2018-03-27 ENCOUNTER — Encounter: Payer: Self-pay | Admitting: Physical Therapy

## 2018-03-27 DIAGNOSIS — M6281 Muscle weakness (generalized): Secondary | ICD-10-CM

## 2018-03-27 DIAGNOSIS — R2689 Other abnormalities of gait and mobility: Secondary | ICD-10-CM

## 2018-03-27 DIAGNOSIS — M25662 Stiffness of left knee, not elsewhere classified: Secondary | ICD-10-CM

## 2018-03-27 DIAGNOSIS — M25562 Pain in left knee: Secondary | ICD-10-CM

## 2018-03-27 DIAGNOSIS — R600 Localized edema: Secondary | ICD-10-CM | POA: Diagnosis not present

## 2018-03-27 NOTE — Therapy (Signed)
[facility] [place] [address] [place], [place], [address] Phone: [phone]   Fax:  [phone]  Physical Therapy Treatment  Patient Details  Name: [name] MRN: [id number] Date of Birth: 04-Apr-1975 Referring Provider (PT): Dr. Ramond Marrow   Encounter Date: 03/27/2018  PT End of Session - 03/27/18 1006    Visit Number  11    Number of Visits  24    Date for PT Re-Evaluation  04/14/18    PT Start Time  0931    PT Stop Time  1009    PT Time Calculation (min)  38 min    Activity Tolerance  Patient tolerated treatment well       Past Medical History:  Diagnosis Date  . ACL (anterior cruciate ligament) tear    MCL. LCL Left knee  . ADHD (attention deficit hyperactivity disorder)    husband  . Anemia   . B12 deficiency   . Blood clot in vein   . Chromosomal abnormality   . Congenital abnormality   . Constipation   . Headache   . HSV (herpes simplex virus) anogenital infection   . Joint pain   . Kidney stone on left side   . Lactose intolerance   . Miscarriage    x 8  . 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 Pinched nerve    neck/shoulder  . Plantar fasciitis   . Post partum depression     Past Surgical History:  Procedure Laterality Date  . arm surgery for fracture    . DILATION AND CURETTAGE OF UTERUS  1998  . KNEE SURGERY Left    torn menicus  . TONSILLECTOMY AND ADENOIDECTOMY    . WISDOM TOOTH EXTRACTION      There were no vitals filed for this visit.  Subjective Assessment - 03/27/18 0935    Subjective  did modified workout Wednesday morning and hips are sore, but knee feels good.    Patient Stated Goals  return to lifting weights    Currently in Pain?  No/denies                       [facility] Adult PT Treatment/Exercise - 03/27/18 0936      Knee/Hip Exercises: Aerobic   Recumbent Bike  L3 x 8 min      Knee/Hip Exercises: Machines for  Strengthening   Cybex Leg Press  2 plates LLE only x 5 min with NMES      Knee/Hip Exercises: Standing   Terminal Knee Extension  Left    Theraband Level (Terminal Knee Extension)  Level 3 (Green);Level 4 (Blue)   green above; blue below   Terminal Knee Extension Limitations  with NMES x 5 min     Forward Step Up  Left;Hand Hold: 2;Step Height: 6"    Forward Step Up Limitations  with NMES x 5 min    SLS  LLE mini squat with RLE to 12:00/3:00/6:00 x 10 reps      Knee/Hip Exercises: Seated   Long Arc Quad  Left    Long Arc Quad Weight  2 lbs.    Long Arc Quad Limitations  with NMES x 5 min               PT Short Term Goals - 03/09/18 0804      PT SHORT TERM GOAL #1   Title  independent with initial HEP    Status  Achieved    Target Date  03/03/18      PT SHORT TERM GOAL #2   Title  improve AROM Lt knee 0-120 for improved mobility    Status  On-going    Target Date  03/03/18      PT SHORT TERM GOAL #3   Title  amb without deviations for improved function    Status  Achieved    Target Date  03/03/18      PT SHORT TERM GOAL #4   Title  report pain < 3/10 with activity for improved function    Baseline  2/17: 2/10 with activity    Status  Achieved    Target Date  03/03/18        PT Long Term Goals - 01/20/18 1313      PT LONG TERM GOAL #1   Title  independent with advanced HEP    Status  New    Target Date  04/14/18      PT LONG TERM GOAL #2   Title  improve FOTO score to </= 38% limited for improved function    Status  New    Target Date  04/14/18      PT LONG TERM GOAL #3   Title  Lt knee AROM 0-140 for improved function and mobility    Status  New    Target Date  04/14/18      PT LONG TERM GOAL #4   Title  demonstrate improved LLE strength of 5/5 for improved function and return to regular exercise    Status  New    Target Date  04/14/18            Plan - 03/27/18 1008    Clinical Impression Statement  Pt continues to have improved  strength and decreased pain so able to progress strengthening exercises.  Progressing well overall.    Rehab Potential  Good    PT Frequency  2x / week    PT Duration  12 weeks    PT Treatment/Interventions  ADLs/Self Care Home Management;Cryotherapy;Electrical Stimulation;Ultrasound;Traction;Moist Heat;Therapeutic activities;Therapeutic exercise;Patient/family education;Manual techniques;Taping;Dry needling;Passive range of motion    PT Next Visit Plan  continue NMES, LLE strengthening, measure ROM    PT Home Exercise Plan  Access Code: [email or web address]    Consulted and Agree with Plan of Care  Patient       Patient will benefit from skilled therapeutic intervention in order to improve the following deficits and impairments:  Abnormal gait, Increased muscle spasms, Pain, Decreased range of motion, Decreased strength, Impaired flexibility, Increased edema  Visit Diagnosis: Acute pain of left knee  Stiffness of left knee, not elsewhere classified  Localized edema  Muscle weakness (generalized)  Other abnormalities of gait and mobility     Problem List Patient Active Problem List   Diagnosis Date Noted  . Heart murmur 03/05/2018  . Insulin resistance 02/04/2018  . Class 3 severe obesity with serious comorbidity and body mass index (BMI) of 40.0 to 44.9 in adult (HCC) 02/04/2018  . Chest pain 12/01/2017  . Psychological factors affecting morbid obesity (HCC) 07/08/2017  . Arthritis 06/18/2017  . Chronic foot pain 06/18/2017  . Family history of colon cancer 12/12/2015  . Short interval between pregnancies affecting pregnancy, antepartum 10/01/2013  . Family history of congenital heart defect 07/17/2012  . Ureteral calculus, left 04/12/2011  . 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 HSV 10/03/2006  . ATTENTION DEFICIT, W/HYPERACTIVITY 10/29/2005  . Lupus anticoagulant positive 10/29/2005  [name], PT, DPT 03/27/18 10:14 AM    [facility] [address] [place], [place], [address] Phone: [phone]   Fax:  [phone]  Name: [name] MRN: [id number] Date of Birth: 09-13-1975

## 2018-04-02 ENCOUNTER — Ambulatory Visit (INDEPENDENT_AMBULATORY_CARE_PROVIDER_SITE_OTHER): Payer: BLUE CROSS/BLUE SHIELD | Admitting: Physical Therapy

## 2018-04-02 ENCOUNTER — Other Ambulatory Visit: Payer: Self-pay

## 2018-04-02 DIAGNOSIS — M25562 Pain in left knee: Secondary | ICD-10-CM

## 2018-04-02 DIAGNOSIS — M25662 Stiffness of left knee, not elsewhere classified: Secondary | ICD-10-CM | POA: Diagnosis not present

## 2018-04-02 DIAGNOSIS — M6281 Muscle weakness (generalized): Secondary | ICD-10-CM

## 2018-04-02 DIAGNOSIS — R600 Localized edema: Secondary | ICD-10-CM | POA: Diagnosis not present

## 2018-04-02 NOTE — Therapy (Signed)
[place], [place], [address] Phone: [phone]   Fax:  [phone]  Physical Therapy Treatment  Patient Details  Name: [name] MRN: [id number] Date of Birth: 03-Jun-1975 Referring Provider (PT): Dr. Ophelia Charter   Encounter Date: 04/02/2018  PT End of Session - 04/02/18 1807    Visit Number  12    Number of Visits  24    Date for PT Re-Evaluation  04/14/18    PT Start Time  1708    PT Stop Time  1755    PT Time Calculation (min)  47 min    Activity Tolerance  Patient tolerated treatment well    Behavior During Therapy  [facility] for tasks assessed/performed       Past Medical History:  Diagnosis Date  . ACL (anterior cruciate ligament) tear    MCL. LCL Left knee  . ADHD (attention deficit hyperactivity disorder)    husband  . Anemia   . B12 deficiency   . Blood clot in vein   . Chromosomal abnormality   . Congenital abnormality   . Constipation   . Headache   . HSV (herpes simplex virus) anogenital infection   . Joint pain   . Kidney stone on left side   . Lactose intolerance   . Miscarriage    x 8  . MTHFR (methylene THF reductase) deficiency and homocystinuria ([place])   . Pinched nerve    neck/shoulder  . Plantar fasciitis   . Post partum depression     Past Surgical History:  Procedure Laterality Date  . arm surgery for fracture    . DILATION AND CURETTAGE OF UTERUS  1998  . KNEE SURGERY Left    torn menicus  . TONSILLECTOMY AND ADENOIDECTOMY    . WISDOM TOOTH EXTRACTION      There were no vitals filed for this visit.  Subjective Assessment - 04/02/18 1709    Subjective  Pt reports her knee feels weak today.   She thinks she slept wrong last night, because she is sore everywhere.     Patient Stated Goals  return to lifting weights    Currently in Pain?  Yes    Pain Score  2     Pain Location  Shoulder    Pain Orientation  Left    Pain Descriptors / Indicators  Sore          [facility] PT Assessment - 04/02/18 0001      Assessment   Medical Diagnosis  Lt ACL reconstruction with hamstring graft    Referring Provider (PT)  Dr. Ophelia Charter    Next MD Visit  April 2020      AROM   Left Knee Extension  0    Left Knee Flexion  130      Strength   Left Hip Flexion  5/5    Left Hip Extension  5/5    Left Hip ABduction  5/5    Left Hip ADduction  4+/5    Left Ankle Dorsiflexion  5/5   20 single leg heel raises      [facility] Adult PT Treatment/Exercise - 04/02/18 0001      Knee/Hip Exercises: Stretches   Passive Hamstring Stretch  Left;2 reps;30 seconds    Quad Stretch  Left;30 seconds;3 reps      Knee/Hip Exercises: Aerobic   Recumbent Bike  L3 x 5 min      Knee/Hip Exercises: Machines for Strengthening  Cybex Leg Press  3 plates LLE only x 5 min with NMES      Knee/Hip Exercises: Standing   Heel Raises  Left;1 set;20 reps    Forward Step Up  Left;Hand Hold: 2;Step Height: 6"    Forward Step Up Limitations  with NMES x 5 min    SLS  LLE mini squat with RLE curtsy x 5 reps, then RLE sliding to front, side, back, x 5 x each.  SLS with ball toss x 10 (LLE)      Knee/Hip Exercises: Seated   Long Arc Quad  Left    Long Arc Quad Weight  --   2.5#   Long Arc Quad Limitations  with NMES x 5 min      Acupuncturist Location  Lt VMO/quad    Electrical Stimulation Action  NMES    Electrical Stimulation Parameters  10 sec on/10sec off     Museum/gallery curator               PT Short Term Goals - 04/02/18 1809      PT SHORT TERM GOAL #1   Title  independent with initial HEP    Status  Achieved      PT SHORT TERM GOAL #2   Title  improve AROM Lt knee 0-120 for improved mobility    Status  Achieved      PT SHORT TERM GOAL #3   Title  amb without deviations for improved function    Status  Achieved      PT SHORT TERM GOAL #4   Title  report pain < 3/10 with activity for improved  function    Status  Achieved        PT Long Term Goals - 04/02/18 1713      PT LONG TERM GOAL #1   Title  independent with advanced HEP    Status  On-going      PT LONG TERM GOAL #2   Title  improve FOTO score to </= 38% limited for improved function    Status  On-going      PT LONG TERM GOAL #3   Title  Lt knee AROM 0-140 for improved function and mobility    Status  Partially Met      PT LONG TERM GOAL #4   Title  demonstrate improved LLE strength of 5/5 for improved function and return to regular exercise    Status  Partially Met            Plan - 04/02/18 1723    Clinical Impression Statement  Pt demonstrated improved LLE strength; has partially met LTG#4.  ROM has improved since last assessment; Met STG #2, partially met LTG#3.  Pt reported minor pain in distal quad with step ups; resolved with rest. Continued progress towards remaining goals.     Rehab Potential  Good    PT Frequency  2x / week    PT Duration  12 weeks    PT Treatment/Interventions  ADLs/Self Care Home Management;Cryotherapy;Electrical Stimulation;Ultrasound;Traction;Moist Heat;Therapeutic activities;Therapeutic exercise;Patient/family education;Manual techniques;Taping;Dry needling;Passive range of motion    PT Next Visit Plan  continue NMES, LLE strengthening.    PT Home Exercise Plan  Access Code: [email or web address]    Consulted and Agree with Plan of Care  Patient       Patient will benefit from skilled therapeutic intervention in order to improve the following deficits and impairments:  Abnormal gait, Increased  muscle spasms, Pain, Decreased range of motion, Decreased strength, Impaired flexibility, Increased edema  Visit Diagnosis: Muscle weakness (generalized)  Acute pain of left knee  Stiffness of left knee, not elsewhere classified  Localized edema     Problem List Patient Active Problem List   Diagnosis Date Noted  . Heart murmur 03/05/2018  . Insulin resistance 02/04/2018  . Class  3 severe obesity with serious comorbidity and body mass index (BMI) of 40.0 to 44.9 in adult ([place]) 02/04/2018  . Chest pain 12/01/2017  . Psychological factors affecting morbid obesity ([place]) 07/08/2017  . Arthritis 06/18/2017  . Chronic foot pain 06/18/2017  . Family history of colon cancer 12/12/2015  . Short interval between pregnancies affecting pregnancy, antepartum 10/01/2013  . Family history of congenital heart defect 07/17/2012  . Ureteral calculus, left 04/12/2011  . 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 HSV 10/03/2006  . ATTENTION DEFICIT, W/HYPERACTIVITY 10/29/2005  . Lupus anticoagulant positive 10/29/2005   Kerin Perna, PTA 04/02/18 6:10 PM  [facility] [place], [place], [address] Phone: [phone]   Fax:  [phone]  Name: ELLIEANA DOLECKI MRN: [id number] Date of Birth: 1975/06/27

## 2018-04-03 ENCOUNTER — Encounter: Payer: BLUE CROSS/BLUE SHIELD | Admitting: Rehabilitative and Restorative Service Providers"

## 2018-04-06 DIAGNOSIS — M6281 Muscle weakness (generalized): Secondary | ICD-10-CM | POA: Diagnosis not present

## 2018-04-06 DIAGNOSIS — M25662 Stiffness of left knee, not elsewhere classified: Secondary | ICD-10-CM | POA: Diagnosis not present

## 2018-04-10 ENCOUNTER — Telehealth: Payer: Self-pay | Admitting: Physical Therapy

## 2018-04-10 NOTE — Telephone Encounter (Signed)
Spoke with patient about closure for OP rehab. Pt feels comfortable with HEP at this time.  Requests we reach out once reopen to schedule final PT appt to transition home.  Clarita Crane, PT, DPT 04/10/18 2:08 PM

## 2018-04-13 ENCOUNTER — Ambulatory Visit (INDEPENDENT_AMBULATORY_CARE_PROVIDER_SITE_OTHER): Payer: BLUE CROSS/BLUE SHIELD | Admitting: Bariatrics

## 2018-04-14 ENCOUNTER — Encounter: Payer: BLUE CROSS/BLUE SHIELD | Admitting: Physical Therapy

## 2018-04-16 ENCOUNTER — Encounter (INDEPENDENT_AMBULATORY_CARE_PROVIDER_SITE_OTHER): Payer: Self-pay

## 2018-05-07 DIAGNOSIS — M25662 Stiffness of left knee, not elsewhere classified: Secondary | ICD-10-CM | POA: Diagnosis not present

## 2018-05-07 DIAGNOSIS — M6281 Muscle weakness (generalized): Secondary | ICD-10-CM | POA: Diagnosis not present

## 2018-05-14 DIAGNOSIS — S83512D Sprain of anterior cruciate ligament of left knee, subsequent encounter: Secondary | ICD-10-CM | POA: Diagnosis not present

## 2018-06-03 ENCOUNTER — Telehealth: Payer: BLUE CROSS/BLUE SHIELD | Admitting: Cardiology

## 2018-06-06 DIAGNOSIS — M6281 Muscle weakness (generalized): Secondary | ICD-10-CM | POA: Diagnosis not present

## 2018-06-06 DIAGNOSIS — M25662 Stiffness of left knee, not elsewhere classified: Secondary | ICD-10-CM | POA: Diagnosis not present

## 2018-06-11 ENCOUNTER — Other Ambulatory Visit: Payer: Self-pay

## 2018-06-11 ENCOUNTER — Encounter: Payer: Self-pay | Admitting: Physical Therapy

## 2018-06-11 ENCOUNTER — Ambulatory Visit: Payer: BLUE CROSS/BLUE SHIELD | Admitting: Physical Therapy

## 2018-06-11 DIAGNOSIS — M6281 Muscle weakness (generalized): Secondary | ICD-10-CM

## 2018-06-11 DIAGNOSIS — M25562 Pain in left knee: Secondary | ICD-10-CM

## 2018-06-11 DIAGNOSIS — M25662 Stiffness of left knee, not elsewhere classified: Secondary | ICD-10-CM

## 2018-06-11 DIAGNOSIS — R600 Localized edema: Secondary | ICD-10-CM | POA: Diagnosis not present

## 2018-06-11 DIAGNOSIS — R2689 Other abnormalities of gait and mobility: Secondary | ICD-10-CM
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
## 2018-06-11 NOTE — Patient Instructions (Signed)
Access Code: [email or web address]  URL: [email or web address]  Date: 06/11/2018  Prepared by: Moshe Cipro   Exercises  Seated Hamstring Curls with Resistance - 10 reps - 3 sets - 3x daily - 7x weekly  Seated Long Arc Quad - 10 reps - 1 sets - 3x daily - 7x weekly  Single Leg Sit to Stand with Arms Extended - 10 reps - 1 sets - 3x daily - 7x weekly  Sidelying Hip Abduction - 1 sets - 10 reps - 3x daily - 7x weekly  Sidelying Hip Adduction - 10 reps - 3 sets - 3x daily - 7x weekly  Wall Quarter Squat - 10 reps - 1 sets - 5-10 sec hold - 3x daily - 7x weekly  Standing Hip Abduction Kicks - 10 reps - 1 sets - 3x daily - 7x weekly  Standing Hip Extension Kicks - 10 reps - 1 sets - 3x daily - 7x weekly  Plank on Knees - 10 reps - 10 sec hold - 3x daily - 7x weekly

## 2018-06-19 ENCOUNTER — Encounter: Payer: Self-pay | Admitting: Physical Therapy

## 2018-06-22 ENCOUNTER — Other Ambulatory Visit: Payer: Self-pay

## 2018-06-22 ENCOUNTER — Encounter: Payer: Self-pay | Admitting: Physical Therapy

## 2018-06-22 ENCOUNTER — Ambulatory Visit (INDEPENDENT_AMBULATORY_CARE_PROVIDER_SITE_OTHER): Payer: BLUE CROSS/BLUE SHIELD | Admitting: Physical Therapy

## 2018-06-22 DIAGNOSIS — R600 Localized edema: Secondary | ICD-10-CM | POA: Diagnosis not present

## 2018-06-22 DIAGNOSIS — M25562 Pain in left knee: Secondary | ICD-10-CM | POA: Diagnosis not present

## 2018-06-22 DIAGNOSIS — M25662 Stiffness of left knee, not elsewhere classified: Secondary | ICD-10-CM

## 2018-06-22 DIAGNOSIS — R2689 Other abnormalities of gait and mobility: Secondary | ICD-10-CM

## 2018-06-22 DIAGNOSIS — M6281 Muscle weakness (generalized): Secondary | ICD-10-CM
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
## 2018-07-01 ENCOUNTER — Encounter: Payer: BLUE CROSS/BLUE SHIELD | Admitting: Physical Therapy

## 2018-07-03 ENCOUNTER — Encounter: Payer: Self-pay | Admitting: Physical Therapy

## 2018-07-07 DIAGNOSIS — M6281 Muscle weakness (generalized): Secondary | ICD-10-CM | POA: Diagnosis not present

## 2018-07-07 DIAGNOSIS — M25662 Stiffness of left knee, not elsewhere classified: Secondary | ICD-10-CM | POA: Diagnosis not present

## 2018-07-08 ENCOUNTER — Encounter: Payer: Self-pay | Admitting: Physical Therapy

## 2018-07-08 ENCOUNTER — Ambulatory Visit (INDEPENDENT_AMBULATORY_CARE_PROVIDER_SITE_OTHER): Payer: BC Managed Care – PPO | Admitting: Physical Therapy

## 2018-07-08 ENCOUNTER — Other Ambulatory Visit: Payer: Self-pay

## 2018-07-08 DIAGNOSIS — R2689 Other abnormalities of gait and mobility: Secondary | ICD-10-CM

## 2018-07-08 DIAGNOSIS — M6281 Muscle weakness (generalized): Secondary | ICD-10-CM

## 2018-07-08 DIAGNOSIS — M25662 Stiffness of left knee, not elsewhere classified: Secondary | ICD-10-CM | POA: Diagnosis not present

## 2018-07-08 DIAGNOSIS — M25562 Pain in left knee: Secondary | ICD-10-CM

## 2018-07-08 DIAGNOSIS — R600 Localized edema: Secondary | ICD-10-CM

## 2018-07-08 NOTE — Patient Instructions (Signed)
Access Code: [email or web address]  URL: [email or web address]  Date: 07/08/2018  Prepared by: Faustino Congress   Exercises  Standard Lunge - 10 reps - 2 sets - 1x daily - 7x weekly  Squat on BOSU Ball - 10 reps - 2 sets - 1x daily - 7x weekly  Standing Balance on BOSU Ball with Bicep Curl - 10 reps - 2 sets - 1x daily - 7x weekly  Plank Curls on BOSU Ball - 10 reps - 2 sets - 1x daily - 7x weekly  Bridge with Hamstring Curl on [employer] - 10 reps - 2 sets - 1x daily - 7x weekly  Full Leg Press - 10 reps - 2 sets - 1x daily - 7x weekly  Knee Extension with Weight Machine - 10 reps - 2 sets - 1x daily - 7x weekly  Hamstring Curl with Weight Machine - 10 reps - 2 sets - 1x daily - 7x weekly  Kettlebell Swing - 10 reps - 2 sets - 1x daily - 7x weekly  Kettlebell Deadlift - 10 reps - 2 sets - 1x daily - 7x weekly  Single Leg Balance on BOSU Ball - 10 reps - 2 sets - 20-30 sec hold - 1x daily - 7x weekly  Lunge Onto BOSU Ball - 10 reps - 2 sets - 1x daily - 7x weekly

## 2018-07-08 NOTE — Therapy (Addendum)
[place], [place], [address] Phone: [phone]   Fax:  [phone]  Physical Therapy Treatment/Discharge  Patient Details  Name: BRODIE SCOVELL MRN: [id number] Date of Birth: [birth year] Referring Provider (PT): Dr. Ophelia Charter   Encounter Date: 07/08/2018  PT End of Session - 07/08/18 1014    Visit Number  15    Number of Visits  24    Date for PT Re-Evaluation  07/23/18    PT Start Time  0930    PT Stop Time  1009    PT Time Calculation (min)  39 min    Activity Tolerance  Patient tolerated treatment well;Patient limited by pain    Behavior During Therapy  [facility] for tasks assessed/performed       Past Medical History:  Diagnosis Date  . ACL (anterior cruciate ligament) tear    MCL. LCL Left knee  . ADHD (attention deficit hyperactivity disorder)    husband  . Anemia   . B12 deficiency   . Blood clot in vein   . Chromosomal abnormality   . Congenital abnormality   . Constipation   . Headache   . HSV (herpes simplex virus) anogenital infection   . Joint pain   . Kidney stone on left side   . Lactose intolerance   . Miscarriage    x 8  . MTHFR (methylene THF reductase) deficiency and homocystinuria ([place])   . Pinched nerve    neck/shoulder  . Plantar fasciitis   . Post partum depression     Past Surgical History:  Procedure Laterality Date  . arm surgery for fracture    . DILATION AND CURETTAGE OF UTERUS  1998  . KNEE SURGERY Left    torn menicus  . TONSILLECTOMY AND ADENOIDECTOMY    . WISDOM TOOTH EXTRACTION      There were no vitals filed for this visit.  Subjective Assessment - 07/08/18 0935    Subjective  gyms are open so she wants to transition to gym program if possible.    Patient Stated Goals  return to lifting weights    Pain Score  2     Pain Location  Knee    Pain Orientation  Left    Pain Descriptors / Indicators  Sore    Pain Type  Surgical pain;Chronic  pain    Pain Onset  More than a month ago    Pain Frequency  Intermittent    Aggravating Factors   bending, walking    Pain Relieving Factors  rest                       [facility] Adult PT Treatment/Exercise - 07/08/18 0935      Knee/Hip Exercises: Aerobic   Elliptical  L2.5 x 5 min      Knee/Hip Exercises: Machines for Strengthening   Cybex Knee Extension  1 plate 2 x 10; cues to focus on eccentric control    Cybex Leg Press  7 plates LLE only X41; seat 4      Knee/Hip Exercises: Standing   Functional Squat  2 sets;10 reps    Functional Squat Limitations  on BOSU    Lunge Walking - Round Trips  x 10 reps; attempted on BOSU - unable    SLS  2x15 sec on BOSU - UE support needed    Other Standing Knee Exercises  discussed kettle bell swings and kettle bell deadlift activities for  gym      Knee/Hip Exercises: Supine   Bridges  Both;10 reps;2 sets    Bridges Limitations  on red physioball      Knee/Hip Exercises: Prone   Other Prone Exercises  plank curls on BOSU x 10 reps             PT Education - 07/08/18 1014    Education Details  gym HEP    Person(s) Educated  Patient    Methods  Explanation;Demonstration;Handout    Comprehension  Verbalized understanding       PT Short Term Goals - 04/02/18 1809      PT SHORT TERM GOAL #1   Title  independent with initial HEP    Status  Achieved      PT SHORT TERM GOAL #2   Title  improve AROM Lt knee 0-120 for improved mobility    Status  Achieved      PT SHORT TERM GOAL #3   Title  amb without deviations for improved function    Status  Achieved      PT SHORT TERM GOAL #4   Title  report pain < 3/10 with activity for improved function    Status  Achieved        PT Long Term Goals - 06/11/18 1052      PT LONG TERM GOAL #1   Title  independent with advanced HEP    Status  On-going    Target Date  07/23/18      PT LONG TERM GOAL #2   Title  improve FOTO score to </= 38% limited for improved  function    Status  On-going    Target Date  07/23/18      PT LONG TERM GOAL #3   Title  Lt knee AROM 0-140 for improved function and mobility    Baseline  5/21: Lt knee AROM at 128 degrees; will plan to focus on strength at this time    Status  Partially Met      PT LONG TERM GOAL #4   Title  demonstrate improved LLE strength of 5/5 for improved function and return to regular exercise    Baseline  5/21: see flowsheet    Status  On-going    Target Date  07/23/18      PT LONG TERM GOAL #5   Title  report pain with exercises < 3/10 for improved activity tolerance and ability to progress exercises    Status  New    Target Date  07/23/18            Plan - 07/08/18 1014    Clinical Impression Statement  Session today focused on gym HEP progression.  Plan for d/c at next visit.    Rehab Potential  Good    PT Frequency  1x / week    PT Duration  6 weeks    PT Treatment/Interventions  ADLs/Self Care Home Management;Cryotherapy;Electrical Stimulation;Ultrasound;Traction;Moist Heat;Therapeutic activities;Therapeutic exercise;Patient/family education;Manual techniques;Taping;Dry needling;Passive range of motion    PT Next Visit Plan  d/c    PT Home Exercise Plan  Access Code: [email or web address]    Consulted and Agree with Plan of Care  Patient       Patient will benefit from skilled therapeutic intervention in order to improve the following deficits and impairments:  Abnormal gait, Increased muscle spasms, Pain, Decreased range of motion, Decreased strength, Impaired flexibility, Increased edema  Visit Diagnosis: 1. Muscle weakness (generalized)   2. Acute pain of  left knee   3. Stiffness of left knee, not elsewhere classified   4. Localized edema   5. Other abnormalities of gait and mobility        Problem List Patient Active Problem List   Diagnosis Date Noted  . Heart murmur 03/05/2018  . Insulin resistance 02/04/2018  . Class 3 severe obesity with serious comorbidity and body  mass index (BMI) of 40.0 to 44.9 in adult ([place]) 02/04/2018  . Chest pain 12/01/2017  . Psychological factors affecting morbid obesity ([place]) 07/08/2017  . Arthritis 06/18/2017  . Chronic foot pain 06/18/2017  . Family history of colon cancer 12/12/2015  . Short interval between pregnancies affecting pregnancy, antepartum 10/01/2013  . Family history of congenital heart defect 07/17/2012  . Ureteral calculus, left 04/12/2011  . 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 HSV 10/03/2006  . ATTENTION DEFICIT, W/HYPERACTIVITY 10/29/2005  . Lupus anticoagulant positive 10/29/2005      Laureen Abrahams, PT, DPT 07/08/18 10:15 AM     [facility] [place], [place], [address] Phone: [phone]   Fax:  [phone]  Name: MAYLEN WALTERMIRE MRN: [id number] Date of Birth: 1975/09/10     PHYSICAL THERAPY DISCHARGE SUMMARY  Visits from Start of Care: 15  Current functional level related to goals / functional outcomes: See above   Remaining deficits: See above   Education / Equipment: HEP  Plan: Patient agrees to discharge.  Patient goals were not met. Patient is being discharged due to being pleased with the current functional level.  ?????     Laureen Abrahams, PT, DPT 07/27/18 9:30 AM  [facility] [place], [place] [address]  [phone] (office) [phone] (fax)

## 2018-07-30 ENCOUNTER — Ambulatory Visit: Payer: BLUE CROSS/BLUE SHIELD | Admitting: Cardiology

## 2018-08-06 DIAGNOSIS — M6281 Muscle weakness (generalized): Secondary | ICD-10-CM | POA: Diagnosis not present

## 2018-08-06 DIAGNOSIS — M25662 Stiffness of left knee, not elsewhere classified: Secondary | ICD-10-CM | POA: Diagnosis not present

## 2018-08-13 ENCOUNTER — Encounter: Payer: Self-pay | Admitting: Family Medicine

## 2018-08-13 ENCOUNTER — Telehealth (INDEPENDENT_AMBULATORY_CARE_PROVIDER_SITE_OTHER): Payer: BC Managed Care – PPO | Admitting: Family Medicine

## 2018-08-13 VITALS — Ht 66.0 in | Wt 278.0 lb

## 2018-08-13 DIAGNOSIS — F5102 Adjustment insomnia: Secondary | ICD-10-CM

## 2018-08-13 DIAGNOSIS — F439 Reaction to severe stress, unspecified: Secondary | ICD-10-CM

## 2018-08-13 DIAGNOSIS — R635 Abnormal weight gain: Secondary | ICD-10-CM

## 2018-08-13 DIAGNOSIS — Z6841 Body Mass Index (BMI) 40.0 and over, adult: Secondary | ICD-10-CM | POA: Diagnosis not present

## 2018-08-13 MED ORDER — BELSOMRA 10 MG PO TABS: 10.0000 mg | ORAL_TABLET | Freq: Every day | ORAL | 0 refills | Status: DC

## 2018-08-13 MED ORDER — BELSOMRA 15 MG PO TABS: 10.0000 mg | ORAL_TABLET | Freq: Every day | ORAL | 0 refills | Status: DC

## 2018-08-13 MED ORDER — BELSOMRA 20 MG PO TABS: 20.0000 mg | ORAL_TABLET | Freq: Every day | ORAL | 0 refills | Status: DC

## 2018-08-13 MED ORDER — SAXENDA 18 MG/3ML ~~LOC~~ SOPN: PEN_INJECTOR | SUBCUTANEOUS | 0 refills | Status: AC

## 2018-08-13 NOTE — Progress Notes (Signed)
Virtual Visit via Video Note  I connected with [name] on 08/14/18 at  4:00 PM EDT by a video enabled telemedicine application and verified that I am speaking with the correct person using two identifiers.   I discussed the limitations of evaluation and management by telemedicine and the availability of in person appointments. The patient expressed understanding and agreed to proceed.       Established Patient Office Visit  Subjective:  Patient ID: [name], female    DOB: 01-26-1975  Age: 43 y.o. MRN: [id number]  CC:  Chief Complaint  Patient presents with  . Insomnia    getting worse since the pandemic    HPI [name] [name] presents for sleep problems. Has had problems on and off for years.  When she gets particularly stressed she has a hard time falling asleep.  More recently with COVID going on she has been under more stress.  It has really changed things for her at home.  1 of her children who is 9 and has special needs has really reverted and is having significant difficulty.  In fact she is no longer eating and has lost so much weight that they are actually thinking about putting in a G-tube for tube feeds.  Being at home away from her friends and without school has been extremely difficult for her.  She says she just feels exhausted by the end of the day trying to help and cope with her daughter as they have been getting the resources that they are used to.  In regards to the insomnia she has tried melatonin, Tylenol PM and even benadryl has tried guided relaxation.  Hard time falling sleep. Hard time turning mind off. Then doesn't sleep soundly. Once wakes up can't fall back asleep.     She has gained some weight since COVID.  She had been following with [facility] and then [facility].  But says since switching to [facility] she really has not lost any more weight she is really just plateaued.  She said she tried sticking to the  diet but feels like it is just too many calories for her.  She also admits that she has been has been eating more caffeine and sugar since she has been stressed.  But she is also been limited with activity because of an ACL tear which she had repaired last year.  She is not able to run.  And she misses going to the gym where she was able to do weight training which she really enjoys and feels like helps reduce her stress overall.   Past Medical History:  Diagnosis Date  . ACL (anterior cruciate ligament) tear    MCL. LCL Left knee  . ADHD (attention deficit hyperactivity disorder)    husband  . Anemia   . B12 deficiency   . Blood clot in vein   . Chromosomal abnormality   . Congenital abnormality   . Constipation   . Headache   . HSV (herpes simplex virus) anogenital infection   . Joint pain   . Kidney stone on left side   . Lactose intolerance   . Miscarriage    x 8  . 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 Pinched nerve    neck/shoulder  . Plantar fasciitis   . Post partum depression     Past Surgical History:  Procedure Laterality Date  . arm surgery for fracture    .  DILATION AND CURETTAGE OF UTERUS  1998  . KNEE SURGERY Left    torn menicus  . TONSILLECTOMY AND ADENOIDECTOMY    . WISDOM TOOTH EXTRACTION      Family History  Adopted: Yes  Problem Relation Age of Onset  . Autism spectrum disorder Daughter   . Other Daughter        Pancreatic insufficiency.   . Colon cancer Mother 18       died at age 37  . Colon polyps Mother   . Colon cancer Maternal Grandmother 79  . Colon cancer Other 55       mat great grand mother  . Lung cancer Father 38       smoker  . Heart attack Maternal Grandfather   . Hypertension Maternal Grandfather   . Diabetes Paternal Grandmother        type 1  . Breast cancer Maternal Aunt 20  . Ovarian cancer Maternal Aunt   . Other Maternal Aunt        reproductive issues  . Colon cancer Maternal  Uncle   . Chiari malformation Sister     Social History   Socioeconomic History  . Marital status: Married    Spouse name: [name]  . Number of children: 8  . Years of education: college  . Highest education level: Not on file  Occupational History  . Occupation: Agricultural engineer  Social Needs  . Financial resource strain: Not on file  . Food insecurity    Worry: Not on file    Inability: Not on file  . Transportation needs    Medical: Not on file    Non-medical: Not on file  Tobacco Use  . Smoking status: Former Smoker    Types: Cigarettes    Quit date: 01/27/2004    Years since quitting: 14.5  . Smokeless tobacco: Never Used  Substance and Sexual Activity  . Alcohol use: No  . Drug use: No  . Sexual activity: Yes    Partners: Male  Lifestyle  . Physical activity    Days per week: Not on file    Minutes per session: Not on file  . Stress: Not on file  Relationships  . Social Herbalist on phone: Not on file    Gets together: Not on file    Attends religious service: Not on file    Active member of club or organization: Not on file    Attends meetings of clubs or organizations: Not on file    Relationship status: Not on file  . Intimate partner violence    Fear of current or ex partner: Not on file    Emotionally abused: Not on file    Physically abused: Not on file    Forced sexual activity: Not on file  Other Topics Concern  . Not on file  Social History Narrative  . Not on file    Outpatient Medications Prior to Visit  Medication Sig Dispense Refill  . acetaminophen (TYLENOL 8 HOUR) 650 MG CR tablet Take 1 tablet by mouth as needed.    Marland Kitchen buPROPion (WELLBUTRIN SR) 150 MG 12 hr tablet Take 1 tablet (150 mg total) by mouth daily. 30 tablet 0  . diclofenac sodium (VOLTAREN) 1 % GEL Apply topically as needed.    . Meloxicam 7.5 MG TBDP Take 7.5 mg by mouth as needed.     . Multiple Vitamin (MULTIVITAMIN) tablet Take 1 tablet by mouth daily.     No  facility-administered medications prior to visit.     Allergies  Allergen Reactions  . Dairy Aid [Lactase] Other (See Comments)    Excess gas, bloating, diarrhea  . Nickel   . Phentermine Other (See Comments)    Heart murmur started    ROS Review of Systems    Objective:    Physical Exam  Ht 5\' 6"  (1.676 Shannon)   Wt 278 lb (126.1 kg)   BMI 44.87 kg/Shannon  Wt Readings from Last 3 Encounters:  08/13/18 278 lb (126.1 kg)  03/23/18 253 lb (114.8 kg)  03/05/18 260 lb (117.9 kg)     Health Maintenance Due  Topic Date Due  . PAP SMEAR-Modifier  07/03/2018    There are no preventive care reminders to display for this patient.  Lab Results  Component Value Date   TSH 1.300 11/11/2017   Lab Results  Component Value Date   WBC 13.5 (H) 11/18/2017   HGB 12.3 11/18/2017   HCT 38.8 11/18/2017   MCV 82.7 11/18/2017   PLT 412 (H) 11/18/2017   Lab Results  Component Value Date   NA 142 03/23/2018   K 4.4 03/23/2018   CO2 24 03/23/2018   GLUCOSE 83 03/23/2018   BUN 13 03/23/2018   CREATININE 0.73 03/23/2018   BILITOT 0.4 03/23/2018   ALKPHOS 66 03/23/2018   AST 10 03/23/2018   ALT 14 03/23/2018   PROT 7.3 03/23/2018   ALBUMIN 4.4 03/23/2018   CALCIUM 9.4 03/23/2018   ANIONGAP 8 11/18/2017   Lab Results  Component Value Date   CHOL 163 11/11/2017   Lab Results  Component Value Date   HDL 41 11/11/2017   Lab Results  Component Value Date   LDLCALC 96 11/11/2017   Lab Results  Component Value Date   TRIG 128 11/11/2017   No results found for: CHOLHDL Lab Results  Component Value Date   HGBA1C 5.1 03/23/2018      Assessment & Plan:   Problem List Items Addressed This Visit      Other   Stress at home    Discussed considering therapy/counseling and maybe even medication.  I think this could really help her. She says she will think about it and she wants to work on sleep first.       BMI 40.0-44.9, adult (HCC) - Primary   Relevant Medications    Liraglutide -Weight Management (SAXENDA) 18 MG/3ML SOPN   Adjustment insomnia    She tried [place] years [place]. She wants to minimize side effects and any daytime sedation.  We discussed trial of Belsomra.  Encouraged her to download coupon card to free trial. If not helpful then consider 5mg  ambien since generic.  Work on Physiological scientistsleep hygiene.       Relevant Medications   Suvorexant (BELSOMRA) 15 MG TABS   Suvorexant (BELSOMRA) 20 MG TABS   Suvorexant (BELSOMRA) 10 MG TABS   Abnormal weight gain    Discussed strategies around this.  Was enrolled in bariatric program and has tried several off label medications. Thinks phentermine may have caused a heart murmur. She is open to trying [place]. Insurance may not cover, Warned about nausea. F/U in 1 month. Work on setting regular exercise routing since can't go to the gym right now.   Starting weight: 278 lbs.  Current Weight: 278 lb Previous Weight:  Change in Weight Goal Weight:  Nutrition Goal:  Exercise Goals:  Follow-up: 4 weeks.          Meds ordered  this encounter  Medications  . Suvorexant (BELSOMRA) 15 MG TABS    Sig: Take 10 mg by mouth at bedtime. Pt will bring in coupon care for free trial    Dispense:  10 tablet    Refill:  0  . Liraglutide -Weight Management (SAXENDA) 18 MG/3ML SOPN    Sig: Inject 0.6 mg into the skin daily for 7 days, THEN 1.2 mg daily for 7 days, THEN 1.8 mg daily for 7 days, THEN 2.4 mg daily for 7 days.    Dispense:  3 mL    Refill:  0  . Suvorexant (BELSOMRA) 20 MG TABS    Sig: Take 20 mg by mouth at bedtime.    Dispense:  10 tablet    Refill:  0  . Suvorexant (BELSOMRA) 10 MG TABS    Sig: Take 10 mg by mouth at bedtime.    Dispense:  10 tablet    Refill:  0    Patient will bring coupon card for 10-day free trial.    Follow-up: No follow-ups on file.    I discussed the assessment and treatment plan with the patient. The patient was provided an opportunity to ask questions and all were answered.  The patient agreed with the plan and demonstrated an understanding of the instructions.   The patient was advised to call back or seek an in-person evaluation if the symptoms worsen or if the condition fails to improve as anticipated. Nani Gasseratherine Camdynn Maranto, MD

## 2018-08-14 ENCOUNTER — Encounter: Payer: Self-pay | Admitting: Family Medicine

## 2018-08-14 DIAGNOSIS — R635 Abnormal weight gain: Secondary | ICD-10-CM | POA: Insufficient documentation

## 2018-08-14 NOTE — Assessment & Plan Note (Signed)
She tried [place] years ago. She wants to minimize side effects and any daytime sedation.  We discussed trial of Belsomra.  Encouraged her to download coupon card to free trial. If not helpful then consider 5mg  ambien since generic.  Work on Publishing rights manager.

## 2018-08-14 NOTE — Assessment & Plan Note (Signed)
Discussed considering therapy/counseling and maybe even medication.  I think this could really help her. She says she will think about it and she wants to work on sleep first.

## 2018-08-14 NOTE — Assessment & Plan Note (Addendum)
Discussed strategies around this.  Was enrolled in bariatric program and has tried several off label medications. Thinks phentermine may have caused a heart murmur. She is open to trying [place]. Insurance may not cover, Warned about nausea. F/U in 1 month. Work on setting regular exercise routing since can't go to the gym right now.   Starting weight: 278 lbs.  Current Weight: 278 lb Previous Weight:  Change in Weight Goal Weight:  Nutrition Goal:  Exercise Goals:  Follow-up: 4 weeks.

## 2018-08-19 ENCOUNTER — Ambulatory Visit: Payer: BLUE CROSS/BLUE SHIELD | Admitting: Cardiology

## 2018-08-19 ENCOUNTER — Ambulatory Visit (INDEPENDENT_AMBULATORY_CARE_PROVIDER_SITE_OTHER): Payer: BC Managed Care – PPO | Admitting: Cardiology

## 2018-08-19 ENCOUNTER — Encounter: Payer: Self-pay | Admitting: Cardiology

## 2018-08-19 ENCOUNTER — Other Ambulatory Visit: Payer: Self-pay

## 2018-08-19 VITALS — BP 136/70 | HR 93 | Ht 66.0 in | Wt 288.8 lb

## 2018-08-19 DIAGNOSIS — R011 Cardiac murmur, unspecified: Secondary | ICD-10-CM | POA: Diagnosis not present

## 2018-08-19 DIAGNOSIS — Z6841 Body Mass Index (BMI) 40.0 and over, adult: Secondary | ICD-10-CM | POA: Diagnosis not present

## 2018-08-19 DIAGNOSIS — F439 Reaction to severe stress, unspecified: Secondary | ICD-10-CM | POA: Diagnosis not present

## 2018-08-19 NOTE — Patient Instructions (Signed)
Medication Instructions:  Your physician recommends that you continue on your current medications as directed. Please refer to the Current Medication list given to you today.  If you need a refill on your cardiac medications before your next appointment, please call your pharmacy.   Lab work: None ordered If you have labs (blood work) drawn today and your tests are completely normal, you will receive your results only by: Marland Kitchen MyChart Message (if you have MyChart) OR . A paper copy in the mail If you have any lab test that is abnormal or we need to change your treatment, we will call you to review the results.  Testing/Procedures: None ordered  Follow-Up: At [facility], you and your health needs are our priority.  As part of our continuing mission to provide you with exceptional heart care, we have created designated Provider Care Teams.  These Care Teams include your primary Cardiologist (physician) and Advanced Practice Providers (APPs -  Physician Assistants and Nurse Practitioners) who all work together to provide you with the care you need, when you need it. You will need a follow up appointment in 1 years.  Please call our office 2 months in advance to schedule this appointment.  You may see  Jenne Campus or another member of our [employer] Provider Team in [place]: Shirlee More, MD . Jyl Heinz, MD

## 2018-08-19 NOTE — Progress Notes (Signed)
Cardiology Office Note:    Date:  08/19/2018   ID:  [name], DOB [birth year], MRN [id number]  PCP:  Hali Marry, MD  Cardiologist:  Jenne Campus, MD    Referring MD: Hali Marry, *   Chief Complaint  Patient presents with  . Follow-up  Doing well.  History of Present Illness:    [name] is a 43 y.o. female complex past medical history she does have very difficult situation at home.  She got 7 children 2 of them got significant autism.  She does have a lot of stress at home she got difficulty sleeping.  I am actually our discussion involve mostly about the situation.  Cardiac wise doing well denies have any chest pain palpitations tightness squeezing pressure burning chest.  She used to exercise on the regular basis now because of coronavirus situation that is on hold.  She does have history of ACL fix.   Past Medical History:  Diagnosis Date  . ACL (anterior cruciate ligament) tear    MCL. LCL Left knee  . ADHD (attention deficit hyperactivity disorder)    husband  . Anemia   . B12 deficiency   . Blood clot in vein   . Chromosomal abnormality   . Congenital abnormality   . Constipation   . Headache   . HSV (herpes simplex virus) anogenital infection   . Joint pain   . Kidney stone on left side   . Lactose intolerance   . Miscarriage    x 8  . MTHFR (methylene THF reductase) deficiency and homocystinuria ([place])   . Pinched nerve    neck/shoulder  . Plantar fasciitis   . Post partum depression     Past Surgical History:  Procedure Laterality Date  . arm surgery for fracture    . DILATION AND CURETTAGE OF UTERUS  1998  . KNEE SURGERY Left    torn menicus  . TONSILLECTOMY AND ADENOIDECTOMY    . WISDOM TOOTH EXTRACTION      Current Medications: Current Meds  Medication Sig  . acetaminophen (TYLENOL 8 HOUR) 650 MG CR tablet Take 1 tablet by mouth as needed.  . Liraglutide -Weight Management (SAXENDA) 18 MG/3ML SOPN Inject 0.6  mg into the skin daily for 7 days, THEN 1.2 mg daily for 7 days, THEN 1.8 mg daily for 7 days, THEN 2.4 mg daily for 7 days.  . Meloxicam 7.5 MG TBDP Take 7.5 mg by mouth as needed.   . Multiple Vitamin (MULTIVITAMIN) tablet Take 1 tablet by mouth daily.  . Suvorexant (BELSOMRA) 10 MG TABS Take 10 mg by mouth at bedtime.     Allergies:   Dairy aid [lactase], Nickel, and Phentermine   Social History   Socioeconomic History  . Marital status: Married    Spouse name: Caryl Pina  . Number of children: 8  . Years of education: college  . Highest education level: Not on file  Occupational History  . Occupation: Agricultural engineer  Social Needs  . Financial resource strain: Not on file  . Food insecurity    Worry: Not on file    Inability: Not on file  . Transportation needs    Medical: Not on file    Non-medical: Not on file  Tobacco Use  . Smoking status: Former Smoker    Types: Cigarettes    Quit date: 01/27/2004    Years since quitting: 14.5  . Smokeless tobacco: Never Used  Substance and Sexual Activity  . Alcohol  use: No  . Drug use: No  . Sexual activity: Yes    Partners: Male  Lifestyle  . Physical activity    Days per week: Not on file    Minutes per session: Not on file  . Stress: Not on file  Relationships  . Social Musicianconnections    Talks on phone: Not on file    Gets together: Not on file    Attends religious service: Not on file    Active member of club or organization: Not on file    Attends meetings of clubs or organizations: Not on file    Relationship status: Not on file  Other Topics Concern  . Not on file  Social History Narrative  . Not on file     Family History: The patient's family history includes Autism spectrum disorder in her daughter; Breast cancer (age of onset: 5920) in her maternal aunt; Chiari malformation in her sister; Colon cancer in her maternal uncle; Colon cancer (age of onset: 4445) in her mother; Colon cancer (age of onset: 1462) in her maternal  grandmother; Colon cancer (age of onset: 5489) in an other family member; Colon polyps in her mother; Diabetes in her paternal grandmother; Heart attack in her maternal grandfather; Hypertension in her maternal grandfather; Lung cancer (age of onset: 4055) in her father; Other in her daughter and maternal aunt; Ovarian cancer in her maternal aunt. She was adopted. ROS:   Please see the history of present illness.    All 14 point review of systems negative except as described per history of present illness  EKGs/Labs/Other Studies Reviewed:      Recent Labs: 11/11/2017: TSH 1.300 11/18/2017: Hemoglobin 12.3; Platelets 412 03/23/2018: ALT 14; BUN 13; Creatinine, Ser 0.73; Potassium 4.4; Sodium 142  Recent Lipid Panel    Component Value Date/Time   CHOL 163 11/11/2017 1249   TRIG 128 11/11/2017 1249   HDL 41 11/11/2017 1249   LDLCALC 96 11/11/2017 1249    Physical Exam:    VS:  BP 136/70   Pulse 93   Ht 5\' 6"  (1.676 m)   Wt 288 lb 12.8 oz (131 kg)   SpO2 96%   BMI 46.61 kg/m     Wt Readings from Last 3 Encounters:  08/19/18 288 lb 12.8 oz (131 kg)  08/13/18 278 lb (126.1 kg)  03/23/18 253 lb (114.8 kg)     GEN:  Well nourished, well developed in no acute distress HEENT: Normal NECK: No JVD; No carotid bruits LYMPHATICS: No lymphadenopathy CARDIAC: RRR, no murmurs, no rubs, no gallops RESPIRATORY:  Clear to auscultation without rales, wheezing or rhonchi  ABDOMEN: Soft, non-tender, non-distended MUSCULOSKELETAL:  No edema; No deformity  SKIN: Warm and dry LOWER EXTREMITIES: no swelling NEUROLOGIC:  Alert and oriented x 3 PSYCHIATRIC:  Normal affect   ASSESSMENT:    1. Heart murmur   2. BMI 40.0-44.9, adult (HCC)   3. Stress at home    PLAN:    In order of problems listed above:  1. Heart murmur I cannot hear any echocardiogram showed normal left ventricular ejection fraction without significant valvular pathology 2. Obesity obviously a problem.  She understands  trying to work on losing weight 3. Stress at home with sick children.  She actually relocated to a much larger home and situation is much better right now.  Cardiac wise she is doing well I will see her back in 1 year   Medication Adjustments/Labs and Tests Ordered: Current medicines are reviewed  at length with the patient today.  Concerns regarding medicines are outlined above.  No orders of the defined types were placed in this encounter.  Medication changes: No orders of the defined types were placed in this encounter.   Signed, Georgeanna Leaobert J. Krasowski, MD, [facility]ACC 08/19/2018 9:41 AM    [facility]

## 2018-09-06 DIAGNOSIS — M6281 Muscle weakness (generalized): Secondary | ICD-10-CM | POA: Diagnosis not present

## 2018-09-06 DIAGNOSIS — M25662 Stiffness of left knee, not elsewhere classified: Secondary | ICD-10-CM | POA: Diagnosis not present

## 2018-10-07 DIAGNOSIS — M25662 Stiffness of left knee, not elsewhere classified: Secondary | ICD-10-CM | POA: Diagnosis not present

## 2018-10-07 DIAGNOSIS — M6281 Muscle weakness (generalized): Secondary | ICD-10-CM | POA: Diagnosis not present

## 2018-11-30 ENCOUNTER — Encounter: Payer: Self-pay | Admitting: Family Medicine

## 2018-11-30 ENCOUNTER — Ambulatory Visit (INDEPENDENT_AMBULATORY_CARE_PROVIDER_SITE_OTHER): Payer: BC Managed Care – PPO | Admitting: Family Medicine

## 2018-11-30 VITALS — Temp 99.5°F | Ht 66.0 in | Wt 292.0 lb

## 2018-11-30 DIAGNOSIS — Z6841 Body Mass Index (BMI) 40.0 and over, adult: Secondary | ICD-10-CM

## 2018-11-30 DIAGNOSIS — G43829 Menstrual migraine, not intractable, without status migrainosus: Secondary | ICD-10-CM | POA: Diagnosis not present

## 2018-11-30 DIAGNOSIS — R197 Diarrhea, unspecified: Secondary | ICD-10-CM | POA: Diagnosis not present

## 2018-11-30 MED ORDER — SUMATRIPTAN SUCCINATE 25 MG PO TABS: 25.0000 mg | ORAL_TABLET | ORAL | 0 refills | Status: DC | PRN

## 2018-11-30 NOTE — Progress Notes (Addendum)
Virtual Visit via Video Note  I connected with [name] on 11/30/18 at  2:40 PM EST by a video enabled telemedicine application and verified that I am speaking with the correct person using two identifiers.   I discussed the limitations of evaluation and management by telemedicine and the availability of in person appointments. The patient expressed understanding and agreed to proceed.  Subjective:    CC: Headaches getting worse.   HPI:  Headache - she reports that this morning around 430 AM she woke up with a headache. and stated that over the past few months they have gotten worse. nothing seems to help with the headaches and feels that they are related to her hormones. she informed me that she is taking part of a COVID 19 vaccine study and because she was shoiwing sxs she had to do a self swab and will not be given the results of this. she hasn't had any other sxs. also had 2 episodes of diarrhea last night but is actually feeling better now she was able to eat normally she has been nauseated she says she does not feel sick or fatigued.  She says not sure if she just ate something bad or not.  No known family history of migraines but she is adopted.  Says she always gets headaches right around her menstrual cycles are usually intense and they last anywhere from 1 to 2 days she just feels like they have gotten worse as she is gotten a little bit older.  Sometimes she will also get a headache for a day or 2 right around the time that she ovulates.  In regards to her journey for weight loss she let me know that she is no longer going to: For consultation she is currently doing new them and so far that has actually been working well for her.  She likes the fact that it is daily.  She is doing great with sticking with the caloric intake recommendation which is right around 1250 cal but says she does need to ramp up her exercise.  Past medical history, Surgical history, Family history not  pertinant except as noted below, Social history, Allergies, and medications have been entered into the medical record, reviewed, and corrections made.   Review of Systems: No fevers, chills, night sweats, weight loss, chest pain, or shortness of breath.   Objective:    General: Speaking clearly in complete sentences without any shortness of breath.  Alert and oriented x3.  Normal judgment. No apparent acute distress.    Impression and Recommendations:   Headache-sounds somewhat like migraines though she is not getting any light or sound sensitivity.  But they sound quite intense and typically last for 1 to 2 days at a time.  They do seem to be menstrual triggered.  We did discuss starting with trying to find a rescue medication it well works well for her.  She says she does seem to be sensitive to medication so we will start with just 25 mg of Imitrex.  If this is not working or she feels that she is having side effects and please let us know and we can try different triptan.  Did encourage her to keep a headache diary if they become more frequent and we can even consider prophylaxis if needed.  BMI > 40/47.13-currently using NOOM.  This is great.  It basically focuses on behaviors and psychology.  I am hoping that she will be very successful.    Diarrhea -  seems to have resolved on its own and she is not having any other symptoms with it.  She did get Covid tested today as part of the research study.  Just encouraged her to let us know if she has any recurrent symptoms.  I discussed the assessment and treatment plan with the patient. The patient was provided an opportunity to ask questions and all were answered. The patient agreed with the plan and demonstrated an understanding of the instructions.   The patient was advised to call back or seek an in-person evaluation if the symptoms worsen or if the condition fails to improve as anticipated.   Nani Gasser, MD

## 2018-12-29 ENCOUNTER — Encounter: Payer: Self-pay | Admitting: Family Medicine

## 2018-12-30 MED ORDER — ELETRIPTAN HYDROBROMIDE 20 MG PO TABS: 20.0000 mg | ORAL_TABLET | ORAL | 0 refills | Status: DC | PRN

## 2019-01-08 ENCOUNTER — Other Ambulatory Visit: Payer: Self-pay | Admitting: Family Medicine

## 2019-01-08 DIAGNOSIS — G43829 Menstrual migraine, not intractable, without status migrainosus: Secondary | ICD-10-CM

## 2019-01-10 ENCOUNTER — Encounter: Payer: Self-pay | Admitting: Family Medicine

## 2019-01-11 MED ORDER — VALACYCLOVIR HCL 1 G PO TABS: 2000.0000 mg | ORAL_TABLET | Freq: Two times a day (BID) | ORAL | 3 refills | Status: DC

## 2019-01-11 NOTE — Telephone Encounter (Signed)
Last written 07/19/17  RX pended

## 2019-01-26 ENCOUNTER — Encounter: Payer: Self-pay | Admitting: Family Medicine

## 2019-02-11 LAB — NOVEL CORONAVIRUS, NAA: SARS-CoV-2, NAA: DETECTED

## 2019-02-12 ENCOUNTER — Encounter: Payer: Self-pay | Admitting: Family Medicine

## 2019-02-15 NOTE — Telephone Encounter (Signed)
No other recommendations for symptomatic care except for just doing well hydrated and getting rest.  There is some questionable evidence for supplementation with vitamin C etc. but that really has not been proven quite yet. But is certainly not harmful.

## 2019-03-08 ENCOUNTER — Encounter: Payer: Self-pay | Admitting: Family Medicine

## 2019-03-08 ENCOUNTER — Telehealth (INDEPENDENT_AMBULATORY_CARE_PROVIDER_SITE_OTHER): Payer: BC Managed Care – PPO | Admitting: Family Medicine

## 2019-03-08 VITALS — Ht 66.0 in | Wt 300.0 lb

## 2019-03-08 DIAGNOSIS — G43829 Menstrual migraine, not intractable, without status migrainosus: Secondary | ICD-10-CM | POA: Diagnosis not present

## 2019-03-08 MED ORDER — METOPROLOL SUCCINATE ER 25 MG PO TB24: 25.0000 mg | ORAL_TABLET | Freq: Every day | ORAL | 1 refills | Status: DC

## 2019-03-08 MED ORDER — ELETRIPTAN HYDROBROMIDE 20 MG PO TABS: 20.0000 mg | ORAL_TABLET | ORAL | 5 refills | Status: DC | PRN

## 2019-03-08 NOTE — Progress Notes (Signed)
Virtual Visit via Video Note  I connected with [name] on 03/08/19 at  1:20 PM EST by a video enabled telemedicine application and verified that I am speaking with the correct person using two identifiers.   I discussed the limitations of evaluation and management by telemedicine and the availability of in person appointments. The patient expressed understanding and agreed to proceed.  Subjective:    CC: Worsening Headaches.   HPI: Pt reports that she currently takes medication for migraines and has noticed that since testing positive for COVID (02/10/19)she has noticed an increase of her headaches. She doesn't really feel that her headaches are related to COVID she stated that they seem to affect her around her cycle. She stated that she was advised to call if she is needing to take the the Eletriptan more often to possibly be started on a daily preventative for migraines. HA is mostly left sided.    She reports that the pain starts at her neck and then move forward and she experiences watering only in her L eye. She has tried heat(form hot shower), ice packs,tylenol and IBU and they work somewhat but has noticed that if she takes sudafed this helps her a little more. She hasn't taken any tylenol since about 4 am today. And the last dose of Eletriptan was on 2/5. She also reports that she sometimes will awaken with a headache which was happening prior to positive COVID test.  Past medical history, Surgical history, Family history not pertinant except as noted below, Social history, Allergies, and medications have been entered into the medical record, reviewed, and corrections made.   Review of Systems: No fevers, chills, night sweats, weight loss, chest pain, or shortness of breath.   Objective:    General: Speaking clearly in complete sentences without any shortness of breath.  Alert and oriented x3.  Normal judgment. No apparent acute distress.    Impression and Recommendations:    Migraine HA - Discussed tx options including BB blocker, Ch Blocker,  TCAs, topamax, etc.  Will start BB blocker. She does have a BP cuff at home but not sure it is accurate. Monitor for light headedness and dizziness. Change the Relpax to 9 tabs for insurance purposes. OK to still use PRN for rescue.  Follow-up in about 3 to 4 weeks to see if she feels like it is helping reduce the frequency of her headaches.  We can always adjust the dose at that time.  We can always consider amitriptyline as well but she has recently gained some weight back and is really trying to get the weight back off and so I do not want to do anything that might cause gain, or contradict her attempts at weight loss.  I discussed the assessment and treatment plan with the patient. The patient was provided an opportunity to ask questions and all were answered. The patient agreed with the plan and demonstrated an understanding of the instructions.   The patient was advised to call back or seek an in-person evaluation if the symptoms worsen or if the condition fails to improve as anticipated.  Spent 20 minutes in encounter.  Nani Gasser, MD

## 2019-03-08 NOTE — Progress Notes (Signed)
Pt reports that she currently takes medication for migraines and has noticed that since testing positive for COVID (02/10/19)she has noticed an increase of her headaches. She doesn't really feel that her headaches are related to COVID she stated that they seem to affect her around her cycle. She stated that she was advised to call if she is needing to take the the Eletriptan more often to possibly be started on a daily preventative for migraines. She reports that the pain starts at her neck and then move forward and she experiences watering only in her L eye. She has tried heat(form hot shower), ice packs,tylenol and IBU and they work somewhat but has noticed that if she takes sudafed this helps her a little more. She hasn't taken any tylenol since about 4 am today. And the last dose of Eletriptan was on 2/5. She also reports that she sometimes will awaken with a headache which was happening prior to positive COVID test.

## 2019-03-30 ENCOUNTER — Other Ambulatory Visit: Payer: Self-pay | Admitting: Family Medicine

## 2019-04-02 ENCOUNTER — Encounter: Payer: Self-pay | Admitting: Family Medicine

## 2019-04-02 ENCOUNTER — Ambulatory Visit (INDEPENDENT_AMBULATORY_CARE_PROVIDER_SITE_OTHER): Payer: BC Managed Care – PPO | Admitting: Family Medicine

## 2019-04-02 DIAGNOSIS — H00023 Hordeolum internum right eye, unspecified eyelid: Secondary | ICD-10-CM | POA: Insufficient documentation

## 2019-04-02 DIAGNOSIS — H00021 Hordeolum internum right upper eyelid: Secondary | ICD-10-CM

## 2019-04-02 NOTE — Assessment & Plan Note (Signed)
Symptoms have improve from this morning already Recommend warm compresses several time throughout the day Discussed that this typically resolves with warm compresses along but if not continuing to improve we can try addition of antibiotic drop.

## 2019-04-02 NOTE — Progress Notes (Signed)
[name] - 44 y.o. female MRN [id number]  Date of birth: 1975/11/14  Subjective Chief Complaint  Patient presents with  . Eye Pain    HPI [name] is a 44 y.o. female here today with complaint of eyelid pain.  She reports she has had some weeping from her R eye for a couple of days. Typically has this with her migraines so she didn't really think anything of this.  When she woke up this morning her R upper eyelid was swollen and painful.  She is able to palpate a bump behind the eyelid.  She has continued to have watering of the eye.  She denies fever, chills, sinus pain, pain with eye movement or vision changes.    ROS:  A comprehensive ROS was completed and negative except as noted per HPI  Allergies  Allergen Reactions  . Dairy Aid [Lactase] Other (See Comments)    Excess gas, bloating, diarrhea  . Nickel   . Phentermine Other (See Comments)    Heart murmur started  . Topamax [Topiramate] Other (See Comments)    Memory loss    Past Medical History:  Diagnosis Date  . ACL (anterior cruciate ligament) tear    MCL. LCL Left knee  . ADHD (attention deficit hyperactivity disorder)    husband  . Anemia   . B12 deficiency   . Blood clot in vein   . Chromosomal abnormality   . Congenital abnormality   . Constipation   . Headache   . HSV (herpes simplex virus) anogenital infection   . Joint pain   . Kidney stone on left side   . Lactose intolerance   . Miscarriage    x 8  . 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 Pinched nerve    neck/shoulder  . Plantar fasciitis   . Post partum depression     Past Surgical History:  Procedure Laterality Date  . arm surgery for fracture    . DILATION AND CURETTAGE OF UTERUS  1998  . KNEE SURGERY Left    torn menicus  . TONSILLECTOMY AND ADENOIDECTOMY    . WISDOM TOOTH EXTRACTION      Social History   Socioeconomic History  . Marital status: Married    Spouse name: Caryl Pina  .  Number of children: 8  . Years of education: college  . Highest education level: Not on file  Occupational History  . Occupation: Homemaker  Tobacco Use  . Smoking status: Former Smoker    Types: Cigarettes    Quit date: 01/27/2004    Years since quitting: 15.1  . Smokeless tobacco: Never Used  Substance and Sexual Activity  . Alcohol use: No  . Drug use: No  . Sexual activity: Yes    Partners: Male  Other Topics Concern  . Not on file  Social History Narrative  . Not on file   Social Determinants of Health   Financial Resource Strain:   . Difficulty of Paying Living Expenses:   Food Insecurity:   . Worried About Charity fundraiser in the Last Year:   . Arboriculturist in the Last Year:   Transportation Needs:   . Film/video editor (Medical):   Marland Kitchen Lack of Transportation (Non-Medical):   Physical Activity:   . Days of Exercise per Week:   . Minutes of Exercise per Session:   Stress:   . Feeling of Stress :   Social Connections:   .  Frequency of Communication with Friends and Family:   . Frequency of Social Gatherings with Friends and Family:   . Attends Religious Services:   . Active Member of Clubs or Organizations:   . Attends Banker Meetings:   Marland Kitchen Marital Status:     Family History  Adopted: Yes  Problem Relation Age of Onset  . Autism spectrum disorder Daughter   . Other Daughter        Pancreatic insufficiency.   . Colon cancer Mother 54       died at age 40  . Colon polyps Mother   . Colon cancer Maternal Grandmother 35  . Colon cancer Other 89       mat great grand mother  . Lung cancer Father 89       smoker  . Heart attack Maternal Grandfather   . Hypertension Maternal Grandfather   . Diabetes Paternal Grandmother        type 1  . Breast cancer Maternal Aunt 20  . Ovarian cancer Maternal Aunt   . Other Maternal Aunt        reproductive issues  . Colon cancer Maternal Uncle   . Chiari malformation Sister     Health  Maintenance  Topic Date Due  . PAP SMEAR-Modifier  03/07/2020 (Originally 07/03/2018)  . COLONOSCOPY  02/04/2021  . TETANUS/TDAP  10/04/2026  . INFLUENZA VACCINE  Completed  . HIV Screening  Completed     ----------------------------------------------------------------------------------------------------------------------------------------------------------------------------------------------------------------- Physical Exam BP 139/82   Pulse 73   Temp 98.2 F (36.8 C) (Oral)   Ht 5\' 6"  (1.676 m)   Wt (!) 302 lb (137 kg)   LMP 03/19/2019   BMI 48.74 kg/m   Physical Exam Constitutional:      Appearance: Normal appearance.  HENT:     Head: Normocephalic and atraumatic.  Eyes:     General: No visual field deficit or scleral icterus.       Right eye: Discharge (clear) and hordeolum (Internal, upper lid. ) present. No foreign body.     Extraocular Movements: Extraocular movements intact.     Conjunctiva/sclera:     Right eye: Right conjunctiva is not injected. No chemosis.    Left eye: Left conjunctiva is not injected. No chemosis.    Pupils: Pupils are equal, round, and reactive to light.  Musculoskeletal:     Cervical back: Neck supple.  Neurological:     General: No focal deficit present.     Mental Status: She is alert.  Psychiatric:        Mood and Affect: Mood normal.        Behavior: Behavior normal.     ------------------------------------------------------------------------------------------------------------------------------------------------------------------------------------------------------------------- Assessment and Plan  Internal hordeolum of right eye Symptoms have improve from this morning already Recommend warm compresses several time throughout the day Discussed that this typically resolves with warm compresses along but if not continuing to improve we can try addition of antibiotic drop.     No orders of the defined types were placed in this  encounter.   No follow-ups on file.    This visit occurred during the SARS-CoV-2 public health emergency.  Safety protocols were in place, including screening questions prior to the visit, additional usage of staff PPE, and extensive cleaning of exam room while observing appropriate contact time as indicated for disinfecting solutions.

## 2019-04-02 NOTE — Patient Instructions (Signed)

## 2019-04-26 ENCOUNTER — Other Ambulatory Visit: Payer: Self-pay | Admitting: Family Medicine

## 2019-05-13 ENCOUNTER — Encounter: Payer: Self-pay | Admitting: Family Medicine

## 2019-05-13 ENCOUNTER — Other Ambulatory Visit: Payer: Self-pay | Admitting: Family Medicine

## 2019-05-13 MED ORDER — POLYMYXIN B-TRIMETHOPRIM 10000-0.1 UNIT/ML-% OP SOLN: OPHTHALMIC | 0 refills | Status: DC

## 2019-08-03 ENCOUNTER — Telehealth (INDEPENDENT_AMBULATORY_CARE_PROVIDER_SITE_OTHER): Payer: BC Managed Care – PPO | Admitting: Family Medicine

## 2019-08-03 DIAGNOSIS — F902 Attention-deficit hyperactivity disorder, combined type: Secondary | ICD-10-CM

## 2019-08-03 MED ORDER — AMPHETAMINE-DEXTROAMPHET ER 15 MG PO CP24: 15.0000 mg | ORAL_CAPSULE | ORAL | 0 refills | Status: DC

## 2019-08-03 NOTE — Progress Notes (Signed)
Virtual Visit via Video Note  I connected with Duwaine Maxin on 08/04/19 at  9:50 AM EDT by a video enabled telemedicine application and verified that I am speaking with the correct person using two identifiers.   I discussed the limitations of evaluation and management by telemedicine and the availability of in person appointments. The patient expressed understanding and agreed to proceed.  Patient location: at home Provider location: in office     Established Patient Office Visit  Subjective:  Patient ID: [name], female    DOB: February 18, 1975  Age: 44 y.o. MRN: [id number]  CC: No chief complaint on file.   HPI [name] presents for ADHD.  She was actually diagnosed years ago based on screening tool and history.  A couple treatments were tried but they were not successful so it was not addressed again.  But more recently after having had multiple children diagnosed with ADHD.,  The pediatrician had recommend that she consider getting evaluated again.  Her husband also has a diagnosis of ADHD.  And reflecting back she has had difficulty with focus and attention and completing projects.  Had 7 different majors in college. Has had multiple jobs, because she would feel like when she learned that she would get bored and would want to move on.. Tends to interupt people.  She says she is currently started multiple books none of which has she finished this summer.  She would like to actively try prescription medication again for treatment.  She denies any recent chest pain or shortness of breath.  Past Medical History:  Diagnosis Date  . ACL (anterior cruciate ligament) tear    MCL. LCL Left knee  . ADHD (attention deficit hyperactivity disorder)    husband  . Anemia   . B12 deficiency   . Blood clot in vein   . Chromosomal abnormality   . Congenital abnormality   . Constipation   . Headache   . HSV (herpes simplex virus) anogenital infection   . Joint pain   . Kidney stone  on left side   . Lactose intolerance   . Miscarriage    x 8  . 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 Pinched nerve    neck/shoulder  . Plantar fasciitis   . Post partum depression     Past Surgical History:  Procedure Laterality Date  . arm surgery for fracture    . DILATION AND CURETTAGE OF UTERUS  1998  . KNEE SURGERY Left    torn menicus  . TONSILLECTOMY AND ADENOIDECTOMY    . WISDOM TOOTH EXTRACTION      Family History  Adopted: Yes  Problem Relation Age of Onset  . Autism spectrum disorder Daughter   . Other Daughter        Pancreatic insufficiency.   . Colon cancer Mother 27       died at age 84  . Colon polyps Mother   . Colon cancer Maternal Grandmother 64  . Colon cancer Other 89       mat great grand mother  . Lung cancer Father 27       smoker  . Heart attack Maternal Grandfather   . Hypertension Maternal Grandfather   . Diabetes Paternal Grandmother        type 1  . Breast cancer Maternal Aunt 20  . Ovarian cancer Maternal Aunt   . Other Maternal Aunt        reproductive issues  .  Colon cancer Maternal Uncle   . Chiari malformation Sister     Social History   Socioeconomic History  . Marital status: Married    Spouse name: [name]  . Number of children: 8  . Years of education: college  . Highest education level: Not on file  Occupational History  . Occupation: Homemaker  Tobacco Use  . Smoking status: Former Smoker    Types: Cigarettes    Quit date: 01/27/2004    Years since quitting: 15.5  . Smokeless tobacco: Never Used  Vaping Use  . Vaping Use: Never used  Substance and Sexual Activity  . Alcohol use: No  . Drug use: No  . Sexual activity: Yes    Partners: Male  Other Topics Concern  . Not on file  Social History Narrative  . Not on file   Social Determinants of Health   Financial Resource Strain:   . Difficulty of Paying Living Expenses:   Food Insecurity:   . Worried About Brewing technologist in the Last Year:   . Barista in the Last Year:   Transportation Needs:   . Freight forwarder (Medical):   Marland Kitchen Lack of Transportation (Non-Medical):   Physical Activity:   . Days of Exercise per Week:   . Minutes of Exercise per Session:   Stress:   . Feeling of Stress :   Social Connections:   . Frequency of Communication with Friends and Family:   . Frequency of Social Gatherings with Friends and Family:   . Attends Religious Services:   . Active Member of Clubs or Organizations:   . Attends Banker Meetings:   Marland Kitchen Marital Status:   Intimate Partner Violence:   . Fear of Current or Ex-Partner:   . Emotionally Abused:   Marland Kitchen Physically Abused:   . Sexually Abused:     Outpatient Medications Prior to Visit  Medication Sig Dispense Refill  . cetirizine (ZYRTEC ALLERGY) 10 MG tablet Take 10 mg by mouth daily.    Marland Kitchen eletriptan (RELPAX) 20 MG tablet Take 1-2 tablets (20-40 mg total) by mouth as needed for migraine or headache. May repeat in 2 hours if headache persists or recurs. 9 tablet 5  . metoprolol succinate (TOPROL-XL) 25 MG 24 hr tablet TAKE 1 TABLET BY MOUTH EVERY DAY 90 tablet 1  . Multiple Vitamin (MULTIVITAMIN) tablet Take 1 tablet by mouth daily.    Marland Kitchen trimethoprim-polymyxin b (POLYTRIM) ophthalmic solution 1 gtt in the affected eye TID x 7 days 10 mL 0   No facility-administered medications prior to visit.    Allergies  Allergen Reactions  . Dairy Aid [Lactase] Other (See Comments)    Excess gas, bloating, diarrhea  . Nickel   . Phentermine Other (See Comments)    Heart murmur started  . Topamax [Topiramate] Other (See Comments)    Memory loss    ROS Review of Systems    Objective:    Physical Exam Vitals reviewed.  Constitutional:      Appearance: She is well-developed.  HENT:     Head: Normocephalic and atraumatic.  Eyes:     Conjunctiva/sclera: Conjunctivae normal.  Pulmonary:     Effort: Pulmonary effort is normal.   Skin:    General: Skin is dry.     Coloration: Skin is not pale.  Neurological:     Mental Status: She is alert and oriented to person, place, and time.  Psychiatric:  Behavior: Behavior normal.     There were no vitals taken for this visit. Wt Readings from Last 3 Encounters:  04/02/19 (!) 302 lb (137 kg)  03/08/19 300 lb (136.1 kg)  11/30/18 292 lb (132.5 kg)     Health Maintenance Due  Topic Date Due  . Hepatitis C Screening  Never done  . COVID-19 Vaccine (1) Never done    There are no preventive care reminders to display for this patient.  Lab Results  Component Value Date   TSH 1.300 11/11/2017   Lab Results  Component Value Date   WBC 13.5 (H) 11/18/2017   HGB 12.3 11/18/2017   HCT 38.8 11/18/2017   MCV 82.7 11/18/2017   PLT 412 (H) 11/18/2017   Lab Results  Component Value Date   NA 142 03/23/2018   K 4.4 03/23/2018   CO2 24 03/23/2018   GLUCOSE 83 03/23/2018   BUN 13 03/23/2018   CREATININE 0.73 03/23/2018   BILITOT 0.4 03/23/2018   ALKPHOS 66 03/23/2018   AST 10 03/23/2018   ALT 14 03/23/2018   PROT 7.3 03/23/2018   ALBUMIN 4.4 03/23/2018   CALCIUM 9.4 03/23/2018   ANIONGAP 8 11/18/2017   Lab Results  Component Value Date   CHOL 163 11/11/2017   Lab Results  Component Value Date   HDL 41 11/11/2017   Lab Results  Component Value Date   LDLCALC 96 11/11/2017   Lab Results  Component Value Date   TRIG 128 11/11/2017   No results found for: CHOLHDL Lab Results  Component Value Date   HGBA1C 5.1 03/23/2018      Assessment & Plan:   Problem List Items Addressed This Visit      Other   RESOLVED: Attention deficit hyperactivity disorder (ADHD) - Primary   Relevant Medications   amphetamine-dextroamphetamine (ADDERALL XR) 15 MG 24 hr capsule     ADHD-reactivated on problem list.  Discussed treatment options.  I think she would be a great candidate for Adderall she is actually never tried a stimulant for treatment.  We  did discuss potential risks including chest pain palpitations etc. she would need to significantly decrease her caffeine intake which is actually quite high.  Follow-up in 30 days we can adjust her dose of medication at time.  If it bumps her blood pressure or causes increased heart rate or palpitations then stop immediately.  Meds ordered this encounter  Medications  . amphetamine-dextroamphetamine (ADDERALL XR) 15 MG 24 hr capsule    Sig: Take 1 capsule by mouth every morning.    Dispense:  30 capsule    Refill:  0    Follow-up: Return in about 4 weeks (around 08/31/2019) for New start medication/ADHD.     Time spent in encounter 22 minutes  I discussed the assessment and treatment plan with the patient. The patient was provided an opportunity to ask questions and all were answered. The patient agreed with the plan and demonstrated an understanding of the instructions.   The patient was advised to call back or seek an in-person evaluation if the symptoms worsen or if the condition fails to improve as anticipated.   Nani Gasser, MD

## 2019-08-04 ENCOUNTER — Encounter: Payer: Self-pay | Admitting: Family Medicine

## 2019-08-05 ENCOUNTER — Encounter: Payer: Self-pay | Admitting: Family Medicine

## 2019-08-10 ENCOUNTER — Telehealth: Payer: BC Managed Care – PPO | Admitting: Family Medicine

## 2019-08-10 MED ORDER — AMPHETAMINE-DEXTROAMPHETAMINE 15 MG PO TABS: 15.0000 mg | ORAL_TABLET | Freq: Every day | ORAL | 0 refills | Status: DC

## 2019-08-10 NOTE — Addendum Note (Signed)
Addended by: Nani Gasser D on: 08/10/2019 07:43 AM   Modules accepted: Orders

## 2019-08-10 NOTE — Telephone Encounter (Signed)
rx sent for short acting. Taking it with her relpax can inc BP so may need to skip afternoon dose if has to take her relpax

## 2019-08-25 ENCOUNTER — Telehealth: Payer: Self-pay

## 2019-08-25 NOTE — Telephone Encounter (Signed)
She should have enough medication until the 12th it was filled on July 13.  So she may be short about 1 day but I just would rather follow-up with her on the 13th to make sure that were not going to make any changes before sending in another 30-day refill.

## 2019-08-25 NOTE — Telephone Encounter (Signed)
Pt called requesting a med refill for adderall. She has an upcoming appointment on 09/03/19. Requesting if provider can authorize the rx refills. Pls advise, thanks.

## 2019-08-25 NOTE — Telephone Encounter (Signed)
Task completed. Pt has been scheduled on 08/31/19 to follow up with provider. Aware when she is due for her next refill. No other inquiries during the call.

## 2019-08-31 ENCOUNTER — Telehealth: Payer: BC Managed Care – PPO | Admitting: Family Medicine

## 2019-08-31 ENCOUNTER — Telehealth: Payer: Self-pay | Admitting: Family Medicine

## 2019-08-31 DIAGNOSIS — F902 Attention-deficit hyperactivity disorder, combined type: Secondary | ICD-10-CM

## 2019-08-31 MED ORDER — AMPHETAMINE-DEXTROAMPHET ER 15 MG PO CP24: 15.0000 mg | ORAL_CAPSULE | ORAL | 0 refills | Status: DC

## 2019-08-31 MED ORDER — AMPHETAMINE-DEXTROAMPHETAMINE 15 MG PO TABS: 15.0000 mg | ORAL_TABLET | Freq: Every day | ORAL | 0 refills | Status: DC

## 2019-08-31 NOTE — Telephone Encounter (Signed)
PT. advised

## 2019-08-31 NOTE — Telephone Encounter (Signed)
I will send in enough to get her through until her appointment.

## 2019-08-31 NOTE — Telephone Encounter (Signed)
Routing to covering provider. Pls advise, thanks.  

## 2019-09-06 ENCOUNTER — Telehealth: Payer: BC Managed Care – PPO | Admitting: Family Medicine

## 2019-09-20 ENCOUNTER — Encounter: Payer: Self-pay | Admitting: Family Medicine

## 2019-09-20 ENCOUNTER — Telehealth (INDEPENDENT_AMBULATORY_CARE_PROVIDER_SITE_OTHER): Payer: BC Managed Care – PPO | Admitting: Family Medicine

## 2019-09-20 VITALS — Ht 66.0 in | Wt 289.0 lb

## 2019-09-20 DIAGNOSIS — F902 Attention-deficit hyperactivity disorder, combined type: Secondary | ICD-10-CM | POA: Diagnosis not present

## 2019-09-20 DIAGNOSIS — A09 Infectious gastroenteritis and colitis, unspecified: Secondary | ICD-10-CM

## 2019-09-20 MED ORDER — AMPHETAMINE-DEXTROAMPHET ER 20 MG PO CP24: 20.0000 mg | ORAL_CAPSULE | ORAL | 0 refills | Status: DC

## 2019-09-20 NOTE — Assessment & Plan Note (Addendum)
Doing well on current regimen but would like to try higher dose.  Will inc to 20mg  Adderall XR.  F/U virtual in 4 weeks if need to discuss changes. If not then contact via MyChart to refills. Monitor for new sx such as chest pain, etc.  Monitor BP carefully.

## 2019-09-20 NOTE — Progress Notes (Signed)
Pt reports that she stopped taking the short acting Adderall 15 mg due to how it made her feel. She continues to take the Adderall XR 15 mg. Denies any insomnia,chest pain, palpitations or SOB.   She would like to discuss increasing the strength of the Adderall XR 15 mg.

## 2019-09-20 NOTE — Progress Notes (Signed)
Virtual Visit via Telephone Note  I connected with [name] on 09/20/19 at  1:00 PM EDT by telephone and verified that I am speaking with the correct person using two identifiers.   I discussed the limitations, risks, security and privacy concerns of performing an evaluation and management service by telephone and the availability of in person appointments. I also discussed with the patient that there may be a patient responsible charge related to this service. The patient expressed understanding and agreed to proceed.  Patient location: at home.   Provider loccation: In office   Subjective:    CC: ADD  HPI:   Pt reports that she stopped taking the short acting Adderall 15 mg due to how it made her feel. She continues to take the Adderall XR 15 mg. Denies any insomnia,chest pain, palpitations or SOB.  He says she is really only using the short acting a couple times a month only if she takes the XR really early such as 5 AM she said she did not really like how the short acting made her feel it made her feel a little bit ramped up.  She says when she first started the extended release though she had great results for about 2 weeks she felt very focused, she is getting a lot accomplished, but says that seems to have waned just a little bit but she still feels like it is effective and helpful and is not impacting her sleep quality.  She says in fact she has applied to start a college program in IT and security.  Ports home blood pressures have actually been well controlled, no chest pain no palpitations.  He did want to let me know that she was having some GI symptoms recently.  She says initially she thought she may have had food poisoning she and her husband actually chaired a chicken sandwich they both got sick pretty quickly afterwards.  Her daughter was also having diarrhea and they finally got her tested they felt like it was some type of parasite.  She has been treated and got better  very quickly.  They were suspicious of Giardia though her testing did finally come back negative.  [name] herself says that she started a probiotic and her symptoms have gradually gotten better on their own so she never actually took the prescription medication.   Past medical history, Surgical history, Family history not pertinant except as noted below, Social history, Allergies, and medications have been entered into the medical record, reviewed, and corrections made.   Review of Systems: No fevers, chills, night sweats, weight loss, chest pain, or shortness of breath.   Objective:    General: Speaking clearly in complete sentences without any shortness of breath.  Alert and oriented x3.  Normal judgment. No apparent acute distress.    Impression and Recommendations:    Attention deficit hyperactivity disorder (ADHD), combined type Doing well on current regimen but would like to try higher dose.  Will inc to 20mg  Adderall XR.  F/U virtual in 4 weeks if need to discuss changes. If not then contact via MyChart to refills. Monitor for new sx such as chest pain, etc.  Monitor BP carefully.   Infectious diarrhea She seems to have improved on her own with trial of probiotics.  Would recommend holding off on taking the medication if she is improving on her own especially without a more definitive diagnosis at this point.       I discussed the assessment and  treatment plan with the patient. The patient was provided an opportunity to ask questions and all were answered. The patient agreed with the plan and demonstrated an understanding of the instructions.   The patient was advised to call back or seek an in-person evaluation if the symptoms worsen or if the condition fails to improve as anticipated.  I provided 20 minutes of non-face-to-face time during this encounter.   Nani Gasser, MD

## 2019-09-20 NOTE — Assessment & Plan Note (Signed)
She seems to have improved on her own with trial of probiotics.  Would recommend holding off on taking the medication if she is improving on her own especially without a more definitive diagnosis at this point.

## 2019-10-06 ENCOUNTER — Telehealth: Payer: Self-pay | Admitting: Family Medicine

## 2019-10-06 DIAGNOSIS — Z1231 Encounter for screening mammogram for malignant neoplasm of breast: Secondary | ICD-10-CM

## 2019-10-06 DIAGNOSIS — Z124 Encounter for screening for malignant neoplasm of cervix: Secondary | ICD-10-CM

## 2019-10-06 NOTE — Telephone Encounter (Signed)
Is call patient just want to remind her that she is due for a Pap smear if she has had that done with her OB/GYN please let us know and we can call and get a copy if not then I urged her to schedule this fall.

## 2019-10-06 NOTE — Telephone Encounter (Signed)
Pt advised and she goes down the hall referral placed in case this may be needed. She will call to set up mammogram.

## 2019-10-08 ENCOUNTER — Encounter: Payer: Self-pay | Admitting: Family Medicine

## 2019-10-08 DIAGNOSIS — F902 Attention-deficit hyperactivity disorder, combined type: Secondary | ICD-10-CM

## 2019-10-08 MED ORDER — AMPHETAMINE-DEXTROAMPHET ER 20 MG PO CP24: 20.0000 mg | ORAL_CAPSULE | ORAL | 0 refills | Status: DC

## 2019-10-12 ENCOUNTER — Other Ambulatory Visit: Payer: Self-pay

## 2019-10-12 ENCOUNTER — Emergency Department
Admission: EM | Admit: 2019-10-12 | Discharge: 2019-10-12 | Disposition: A | Discharge status: Home / Self Care | Payer: BC Managed Care – PPO | Source: Home / Self Care | Attending: Family Medicine | Admitting: Family Medicine

## 2019-10-12 ENCOUNTER — Emergency Department (INDEPENDENT_AMBULATORY_CARE_PROVIDER_SITE_OTHER): Payer: BC Managed Care – PPO

## 2019-10-12 DIAGNOSIS — M7050 Other bursitis of knee, unspecified knee: Secondary | ICD-10-CM | POA: Diagnosis not present

## 2019-10-12 DIAGNOSIS — M1712 Unilateral primary osteoarthritis, left knee: Secondary | ICD-10-CM | POA: Diagnosis not present

## 2019-10-12 DIAGNOSIS — M25562 Pain in left knee: Secondary | ICD-10-CM | POA: Diagnosis not present

## 2019-10-12 NOTE — ED Provider Notes (Signed)
[facility]    CSN: [id number] Arrival date & time: 10/12/19  1014      History   Chief Complaint Chief Complaint  Patient presents with  . Knee Pain    LT    HPI [name] is a 44 y.o. female.   Patient awoke this morning with pain/swelling in the anterior aspect of her left knee.  She recalls no recent injury and denies recent changes in activity.  She has a past history of ACL reconstruction in 2019.     The history is provided by the patient.  Knee Pain Location:  Knee Time since incident:  4 hours Injury: no   Knee location:  L knee Pain details:    Quality:  Aching   Radiates to:  Does not radiate   Severity:  Moderate   Onset quality:  Sudden   Duration:  4 hours   Timing:  Constant   Progression:  Unchanged Chronicity:  New Prior injury to area:  Yes Relieved by:  Nothing Worsened by:  Bearing weight, extension and flexion Ineffective treatments:  Ice Associated symptoms: decreased ROM, stiffness and swelling   Associated symptoms: no fatigue, no fever and no muscle weakness   Risk factors: obesity     Past Medical History:  Diagnosis Date  . ACL (anterior cruciate ligament) tear    MCL. LCL Left knee  . ADHD (attention deficit hyperactivity disorder)    husband  . Anemia   . B12 deficiency   . Blood clot in vein   . Chromosomal abnormality   . Congenital abnormality   . Constipation   . Headache   . HSV (herpes simplex virus) anogenital infection   . Joint pain   . Kidney stone on left side   . Lactose intolerance   . Miscarriage    x 8  . 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 Pinched nerve    neck/shoulder  . Plantar fasciitis   . Post partum depression     Patient Active Problem List   Diagnosis Date Noted  . Infectious diarrhea 09/20/2019  . Internal hordeolum of right eye 04/02/2019  . Menstrual migraine without status migrainosus, not intractable 03/08/2019  . Abnormal  weight gain 08/14/2018  . Stress at home 08/13/2018  . Adjustment insomnia 08/13/2018  . BMI 40.0-44.9, adult (HCC) 08/13/2018  . Heart murmur 03/05/2018  . Insulin resistance 02/04/2018  . Class 3 severe obesity with serious comorbidity and body mass index (BMI) of 40.0 to 44.9 in adult (HCC) 02/04/2018  . Psychological factors affecting morbid obesity (HCC) 07/08/2017  . Arthritis 06/18/2017  . Chronic foot pain 06/18/2017  . Family history of colon cancer 12/12/2015  . Family history of congenital heart defect 07/17/2012  . Ureteral calculus, left 04/12/2011  . 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 HSV 10/03/2006  . Attention deficit hyperactivity disorder (ADHD), combined type 10/29/2005    Past Surgical History:  Procedure Laterality Date  . arm surgery for fracture    . DILATION AND CURETTAGE OF UTERUS  1998  . KNEE SURGERY Left    torn menicus  . TONSILLECTOMY AND ADENOIDECTOMY    . WISDOM TOOTH EXTRACTION      OB History    Gravida  18   Para  8   Term  7   Preterm  1   AB  9   Living  7     SAB  8   TAB  1  Ectopic  0   Multiple  0   Live Births  6            Home Medications    Prior to Admission medications   Medication Sig Start Date End Date Taking? Authorizing Provider  amphetamine-dextroamphetamine (ADDERALL XR) 20 MG 24 hr capsule Take 1 capsule (20 mg total) by mouth every morning. 10/20/19   Agapito Games, MD  cetirizine (ZYRTEC ALLERGY) 10 MG tablet Take 10 mg by mouth daily.    [provider]  eletriptan (RELPAX) 20 MG tablet Take 1-2 tablets (20-40 mg total) by mouth as needed for migraine or headache. May repeat in 2 hours if headache persists or recurs. 03/08/19   Agapito Games, MD  Multiple Vitamin (MULTIVITAMIN) tablet Take 1 tablet by mouth daily.    [provider]    Family History Family History  Adopted: Yes  Problem Relation Age of Onset  . Autism spectrum disorder Daughter   .  Other Daughter        Pancreatic insufficiency.   . Colon cancer Mother 4       died at age 5  . Colon polyps Mother   . Colon cancer Maternal Grandmother 45  . Colon cancer Other 89       mat great grand mother  . Lung cancer Father 25       smoker  . Heart attack Maternal Grandfather   . Hypertension Maternal Grandfather   . Diabetes Paternal Grandmother        type 1  . Breast cancer Maternal Aunt 20  . Ovarian cancer Maternal Aunt   . Other Maternal Aunt        reproductive issues  . Colon cancer Maternal Uncle   . Chiari malformation Sister     Social History Social History   Tobacco Use  . Smoking status: Former Smoker    Types: Cigarettes    Quit date: 01/27/2004    Years since quitting: 15.7  . Smokeless tobacco: Never Used  Vaping Use  . Vaping Use: Never used  Substance Use Topics  . Alcohol use: No  . Drug use: No     Allergies   Dairy aid [lactase], Nickel, Phentermine, and Topamax [topiramate]   Review of Systems Review of Systems  Constitutional: Positive for activity change. Negative for chills, diaphoresis, fatigue and fever.  Musculoskeletal: Positive for gait problem, joint swelling and stiffness.  Skin: Negative for color change.  All other systems reviewed and are negative.    Physical Exam Triage Vital Signs ED Triage Vitals [10/12/19 1035]  Enc Vitals Group     BP      Pulse      Resp      Temp      Temp src      SpO2      Weight      Height      Head Circumference      Peak Flow      Pain Score 6     Pain Loc      Pain Edu?      Excl. in GC?    No data found.  Updated Vital Signs LMP 10/08/2019 (Exact Date)   Visual Acuity Right Eye Distance:   Left Eye Distance:   Bilateral Distance:    Right Eye Near:   Left Eye Near:    Bilateral Near:     Physical Exam Nursing note reviewed.  Constitutional:  General: She is not in acute distress.    Appearance: She is obese.  HENT:     Head: Atraumatic.    Pulmonary:     Effort: Pulmonary effort is normal.  Musculoskeletal:        General: Swelling present.     Left knee: Swelling and bony tenderness present. No effusion, erythema, ecchymosis or crepitus. Decreased range of motion. Tenderness present. No LCL laxity, MCL laxity, ACL laxity or PCL laxity.Normal meniscus.       Legs:     Comments: Left knee has swelling and tenderness to palpation over the pes anserine bursa.  Skin:    General: Skin is warm and dry.  Neurological:     Mental Status: She is alert and oriented to person, place, and time.      UC Treatments / Results  Labs (all labs ordered are listed, but only abnormal results are displayed) Labs Reviewed - No data to display  EKG   Radiology No results found.  Procedures Procedures (including critical care time)  Medications Ordered in UC Medications - No data to display  Initial Impression / Assessment and Plan / UC Course  I have reviewed the triage vital signs and the nursing notes.  Pertinent labs & imaging results that were available during my care of the patient were reviewed by me and considered in my medical decision making (see chart for details).    Followup with Dr. Rodney [facility]ton ([facility]) for further evaluation.   Final Clinical Impressions(s) / UC Diagnoses   Final diagnoses:  Pes anserine bursitis     Discharge Instructions     Apply ice pack for 20 to 30 minutes, 3 to 4 times daily  Continue until pain and swelling decrease.  Wear knee brace.  May use Voltaren gel.  Begin range of motion and stretching exercises as tolerated.    ED Prescriptions    None        Lattie Haw, MD 10/15/19 1544

## 2019-10-12 NOTE — Discharge Instructions (Signed)
Apply ice pack for 20 to 30 minutes, 3 to 4 times daily  Continue until pain and swelling decrease.  Wear knee brace.  May use Voltaren gel.  Begin range of motion and stretching exercises as tolerated.

## 2019-10-12 NOTE — ED Triage Notes (Signed)
Pt c/o LT knee pain since waking up this morning. Sharp and shooting pain when bearing weight. Tender and hot to touch. Iced for 2 hours this am. No injury yesterday. ACL reconstruction in 2019, screws placed. Pain 6/10

## 2019-10-21 ENCOUNTER — Other Ambulatory Visit: Payer: Self-pay

## 2019-10-21 ENCOUNTER — Ambulatory Visit (INDEPENDENT_AMBULATORY_CARE_PROVIDER_SITE_OTHER): Payer: BC Managed Care – PPO | Admitting: Sports Medicine

## 2019-10-21 ENCOUNTER — Ambulatory Visit (INDEPENDENT_AMBULATORY_CARE_PROVIDER_SITE_OTHER): Payer: BC Managed Care – PPO

## 2019-10-21 DIAGNOSIS — Z1231 Encounter for screening mammogram for malignant neoplasm of breast: Secondary | ICD-10-CM

## 2019-10-21 DIAGNOSIS — M7050 Other bursitis of knee, unspecified knee: Secondary | ICD-10-CM

## 2019-10-21 HISTORY — DX: Other bursitis of knee, unspecified knee: M70.50

## 2019-10-21 NOTE — Progress Notes (Signed)
    Procedures performed today:    None.  Independent interpretation of notes and tests performed by another provider:   X-rays personally reviewed and show osteoarthritis, I also see sequela of her ACL reconstruction.  Brief History, Exam, Impression, and Recommendations:    Pes anserine bursitis Left knee pain, history of ACL reconstruction, mild osteoarthritis of the knee, she did some conservative treatment and symptoms went away, symptoms were localized at the sartorius/gracilis/semitendinosis insertion, she is now looking for ways to prevent this from occurring again, we discussed weight loss, she will talk to her PCP about Wegovy, we also discussed pes anserine conditioning exercises, as well as compression. Return as needed.    ___________________________________________ Ihor Austin. Benjamin Stain, M.D., ABFM., CAQSM. Primary Care and Sports Medicine [facility]  Adjunct Instructor of Family Medicine  [employer]

## 2019-10-21 NOTE — Assessment & Plan Note (Signed)
Left knee pain, history of ACL reconstruction, mild osteoarthritis of the knee, she did some conservative treatment and symptoms went away, symptoms were localized at the sartorius/gracilis/semitendinosis insertion, she is now looking for ways to prevent this from occurring again, we discussed weight loss, she will talk to her PCP about Wegovy, we also discussed pes anserine conditioning exercises, as well as compression. Return as needed.

## 2019-10-21 NOTE — Patient Instructions (Signed)
Discuss [name] with PCP for weight loss.

## 2019-11-16 ENCOUNTER — Encounter: Payer: Self-pay | Admitting: Family Medicine

## 2019-11-16 DIAGNOSIS — F902 Attention-deficit hyperactivity disorder, combined type: Secondary | ICD-10-CM

## 2019-11-16 MED ORDER — AMPHETAMINE-DEXTROAMPHET ER 20 MG PO CP24: 20.0000 mg | ORAL_CAPSULE | ORAL | 0 refills | Status: DC

## 2019-11-16 NOTE — Telephone Encounter (Signed)
Last med follow up on 09/20/19  Med pended

## 2019-11-19 ENCOUNTER — Ambulatory Visit: Payer: Medicaid Other | Admitting: Cardiology

## 2019-12-17 ENCOUNTER — Encounter: Payer: Self-pay | Admitting: Family Medicine

## 2019-12-22 ENCOUNTER — Encounter: Payer: Self-pay | Admitting: Family Medicine

## 2019-12-22 ENCOUNTER — Telehealth (INDEPENDENT_AMBULATORY_CARE_PROVIDER_SITE_OTHER): Payer: BC Managed Care – PPO | Admitting: Family Medicine

## 2019-12-22 DIAGNOSIS — F902 Attention-deficit hyperactivity disorder, combined type: Secondary | ICD-10-CM

## 2019-12-22 MED ORDER — ADDERALL XR 20 MG PO CP24
20.0000 mg | ORAL_CAPSULE | Freq: Every day | ORAL | 0 refills | Status: DC
Start: 2020-01-21 — End: 2020-04-27

## 2019-12-22 MED ORDER — ADDERALL XR 20 MG PO CP24: 20.0000 mg | ORAL_CAPSULE | Freq: Every day | ORAL | 0 refills | Status: DC

## 2019-12-22 NOTE — Assessment & Plan Note (Signed)
Doing great on the brand Adderall 20 XR.  No longer needing short acting which is fantastic.  She is not feeling the medicine "kick in and kick off".  Which is great.  No other worrisome symptoms or side effects of the medication.  She is actually can be coming in to get her Pap smear with GYN in a couple of weeks and they can check her blood pressure at that time.

## 2019-12-22 NOTE — Progress Notes (Signed)
Pt reports that her insurance will only cover the branded Adderall XR 20 mg. She reports that this lasts longer than the generic and hasn't needed the afternoon dosage. She stated that she has to adjust the way that she plans her day due to the way that the medication releases now.   She is doing well on current dosage. Denies any insomnia, palpitations, or changes in appetite.

## 2019-12-22 NOTE — Progress Notes (Signed)
Virtual Visit via Video Note  I connected with [name] on 12/22/19 at  8:10 AM EST by a video enabled telemedicine application and verified that I am speaking with the correct person using two identifiers.   I discussed the limitations of evaluation and management by telemedicine and the availability of in person appointments. The patient expressed understanding and agreed to proceed.  Patient location: at home Provider location: in office  Subjective:    CC: F/U ADHD  HPI: Pt reports that her insurance will only cover the branded Adderall XR 20 mg. She reports that this lasts longer than the generic and hasn't needed the afternoon dosage. She stated that she has to adjust the way that she plans her day due to the way that the medication releases now.  She says she is not noticing the medication kick in and then kick off.  States been a little bit more difficult to tell when it seems to be working so she has had of rearrange some things on her schedule but feels like it is lasting longer throughout the day and is not having to take short acting medication in the afternoon which has been great.  She does not feel as disorganized and overwhelmed by the time the evening rolls around.  She has been happy with this change.  Has been more hungry on the new medication.  He has gained a couple of pounds.  So she has been trying to exercise more  She is doing well on current dosage. Denies any insomnia, palpitations, or changes in appetite.    Past medical history, Surgical history, Family history not pertinant except as noted below, Social history, Allergies, and medications have been entered into the medical record, reviewed, and corrections made.   Review of Systems: No fevers, chills, night sweats, weight loss, chest pain, or shortness of breath.   Objective:    General: Speaking clearly in complete sentences without any shortness of breath.  Alert and oriented x3.  Normal judgment. No  apparent acute distress.    Impression and Recommendations:    Attention deficit hyperactivity disorder (ADHD), combined type Doing great on the brand Adderall 20 XR.  No longer needing short acting which is fantastic.  She is not feeling the medicine "kick in and kick off".  Which is great.  No other worrisome symptoms or side effects of the medication.  She is actually can be coming in to get her Pap smear with GYN in a couple of weeks and they can check her blood pressure at that time.      Time spent in encounter 18 minutes  I discussed the assessment and treatment plan with the patient. The patient was provided an opportunity to ask questions and all were answered. The patient agreed with the plan and demonstrated an understanding of the instructions.  Nani Gasser, MD

## 2019-12-30 ENCOUNTER — Ambulatory Visit: Payer: BC Managed Care – PPO | Admitting: Obstetrics and Gynecology

## 2019-12-30 ENCOUNTER — Other Ambulatory Visit: Payer: Self-pay

## 2019-12-30 ENCOUNTER — Encounter: Payer: Self-pay | Admitting: Obstetrics and Gynecology

## 2019-12-30 ENCOUNTER — Other Ambulatory Visit (HOSPITAL_COMMUNITY)
Admission: RE | Admit: 2019-12-30 | Discharge: 2019-12-30 | Disposition: A | Discharge status: Home / Self Care | Payer: BC Managed Care – PPO | Source: Ambulatory Visit | Attending: Obstetrics and Gynecology | Admitting: Obstetrics and Gynecology

## 2019-12-30 VITALS — BP 133/82 | HR 96 | Resp 16 | Ht 66.0 in | Wt 266.0 lb

## 2019-12-30 DIAGNOSIS — N939 Abnormal uterine and vaginal bleeding, unspecified: Secondary | ICD-10-CM | POA: Diagnosis not present

## 2019-12-30 DIAGNOSIS — N898 Other specified noninflammatory disorders of vagina: Secondary | ICD-10-CM

## 2019-12-30 DIAGNOSIS — Z124 Encounter for screening for malignant neoplasm of cervix: Secondary | ICD-10-CM | POA: Diagnosis present

## 2019-12-30 NOTE — Progress Notes (Signed)
GYNECOLOGY OFFICE NOTE  History:  44 y.o. [id number] here today for bump on vagina. Doesn't hurt but noticed it. Took valtrex and has not had any worsening or improvement. Also needs pap smear, reports last pap was 5 years ago and has always had normal. Also reports new onset irregular cycles. Reports one cycle was extremely long and she bled for about 14 days. Following cycle was normal length and had normal period. No other complaints.    Past Medical History:  Diagnosis Date  . ACL (anterior cruciate ligament) tear    MCL. LCL Left knee  . ADHD (attention deficit hyperactivity disorder)    husband  . Anemia   . B12 deficiency   . Blood clot in vein   . Chromosomal abnormality   . Congenital abnormality   . Constipation   . Headache   . HSV (herpes simplex virus) anogenital infection   . Joint pain   . Kidney stone on left side   . Lactose intolerance   . Miscarriage    x 8  . 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 Pinched nerve    neck/shoulder  . Plantar fasciitis   . Post partum depression     Past Surgical History:  Procedure Laterality Date  . arm surgery for fracture    . DILATION AND CURETTAGE OF UTERUS  1998  . KNEE SURGERY Left    torn menicus  . TONSILLECTOMY AND ADENOIDECTOMY    . WISDOM TOOTH EXTRACTION       Current Outpatient Medications:  .  [START ON 02/20/2020] ADDERALL XR 20 MG 24 hr capsule, Take 1 capsule (20 mg total) by mouth daily., Disp: 30 capsule, Rfl: 0 .  [START ON 01/21/2020] ADDERALL XR 20 MG 24 hr capsule, Take 1 capsule (20 mg total) by mouth daily., Disp: 30 capsule, Rfl: 0 .  ADDERALL XR 20 MG 24 hr capsule, Take 1 capsule (20 mg total) by mouth daily., Disp: 30 capsule, Rfl: 0 .  cetirizine (ZYRTEC) 10 MG tablet, Take 10 mg by mouth daily., Disp: , Rfl:  .  cholecalciferol (VITAMIN D3) 25 MCG (1000 UNIT) tablet, Take 2,000 Units by mouth daily., Disp: , Rfl:  .  eletriptan (RELPAX) 20 MG tablet,  Take 1-2 tablets (20-40 mg total) by mouth as needed for migraine or headache. May repeat in 2 hours if headache persists or recurs., Disp: 9 tablet, Rfl: 5 .  Multiple Vitamin (MULTIVITAMIN) tablet, Take 1 tablet by mouth daily., Disp: , Rfl:   The following portions of the patient's history were reviewed and updated as appropriate: allergies, current medications, past family history, past medical history, past social history, past surgical history and problem list.   Review of Systems:  Pertinent items noted in HPI and remainder of comprehensive ROS otherwise negative.   Objective:  Physical Exam BP 133/82   Pulse 96   Resp 16   Ht 5\' 6"  (1.676 m)   Wt 266 lb (120.7 kg)   LMP 12/21/2019 (Exact Date)   BMI 42.93 kg/m  CONSTITUTIONAL: Well-developed, well-nourished female in no acute distress.  HENT:  Normocephalic, atraumatic. External right and left ear normal. Oropharynx is clear and moist EYES: Conjunctivae and EOM are normal. Pupils are equal, round, and reactive to light. No scleral icterus.  NECK: Normal range of motion, supple, no masses SKIN: Skin is warm and dry. No rash noted. Not diaphoretic. No erythema. No pallor. NEUROLOGIC: Alert and oriented to person, place, and  time. Normal reflexes, muscle tone coordination. No cranial nerve deficit noted. PSYCHIATRIC: Normal mood and affect. Normal behavior. Normal judgment and thought content. CARDIOVASCULAR: Normal heart rate noted RESPIRATORY: Effort normal, no problems with respiration noted ABDOMEN: Soft, no distention noted.   PELVIC: Normal appearing external genitalia; 1 mm sebaceous cyst on left vulva at hairline, normal appearing vaginal mucosa and cervix.  No abnormal discharge noted.  Pap smear obtained.   MUSCULOSKELETAL: Normal range of motion. No edema noted.  Exam done with chaperone present.  Labs and Imaging No results found.  Assessment & Plan:   1. Pap smear for cervical cancer screening - Cytology -  PAP( [place])  2. Abnormal uterine bleeding (AUB) - Likely irregular cycling due to age - reviewed abnormal cycles, would recommend EMB if continues to have heavy bleeding or significant changes in cycle  - reviewed menopause, symptoms, diagnosis requiring no bleeding x 1 year - no further management indicated at this point  3. Vaginal lesion Sebaceous cyst Likely to dissolve on its own Return prn if worsens   Routine preventative health maintenance measures emphasized. Please refer to After Visit Summary for other counseling recommendations.   Return in about 1 year (around 12/29/2020), or if symptoms worsen or fail to improve.  Total face-to-face time with patient: 22 minutes. Over 50% of encounter was spent on counseling and coordination of care.  Baldemar Lenis, M.D. Attending [facility])

## 2019-12-31 LAB — CYTOLOGY - PAP
Comment: NEGATIVE
Diagnosis: NEGATIVE
High risk HPV: NEGATIVE

## 2020-02-22 ENCOUNTER — Other Ambulatory Visit: Payer: Self-pay | Admitting: Family Medicine

## 2020-03-02 NOTE — Telephone Encounter (Signed)
myChart sent

## 2020-03-02 NOTE — Telephone Encounter (Signed)
Patient is due for visit in March, please sign denial so she knows she needs visit.

## 2020-03-27 NOTE — Progress Notes (Signed)
Virtual Visit via Video Note  I connected with [name] on 03/28/20 at  9:50 AM EST by a video enabled telemedicine application and verified that I am speaking with the correct person using two identifiers.   I discussed the limitations of evaluation and management by telemedicine and the availability of in person appointments. The patient expressed understanding and agreed to proceed.  Patient location: at work.  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  She reports that the Adderall has been really helping and really improving her focus by about 30 to 40% in general.  She wonders if she went up a little bit if it would be even better.  Her only concern is that she is having a really hard time getting her medication.  Every time she tries to call to fill it a couple days early the pharmacy says they do not have it on hand and they have to order it so she purposely calls a few days ahead of time so that they can try to get it in inevitably they cannot give her an estimated time of arrival and so she is often times waited between 3 to 5 days to get her medications, and gone without during that timeframe.  She says it happens on most every time she tries to fill the medication at [facility].   Past medical history, Surgical history, Family history not pertinant except as noted below, Social history, Allergies, and medications have been entered into the medical record, reviewed, and corrections made.   Review of Systems: No fevers, chills, night sweats, weight loss, chest pain, or shortness of breath.   Objective:    General: Speaking clearly in complete sentences without any shortness of breath.  Alert and oriented x3.  Normal judgment. No apparent acute distress.    Impression and Recommendations:    Attention deficit hyperactivity disorder (ADHD), combined type Discussed options.  We could certainly  try a 30-day supply of the 25 mg extended release Adderall.  Did encourage her to check with her insurance to see if we could do 90-day mail order that might help with some of the supply issues.  Or we could even consider switching to something else like Vyvanse which is what her husband takes and he is not had any difficulty with backorder of medications.  New prescription sent to pharmacy we will follow up with a video visit in about 1 month to see how she is doing with the adjusted dose.  She would prefer to hold off on any major changes until possibly a summer break between classes so that she can make sure that she is doing well on the regimen.  Monitor for any side effects.      Time spent in encounter 21 minutes  I discussed the assessment and treatment plan with the patient. The patient was provided an opportunity to ask questions and all were answered. The patient agreed with the plan and demonstrated an understanding of the instructions.   The patient was advised to call back or seek an in-person evaluation if the symptoms worsen or if the condition fails to improve as anticipated.   Nani Gasser, MD

## 2020-03-28 ENCOUNTER — Encounter: Payer: Self-pay | Admitting: Family Medicine

## 2020-03-28 ENCOUNTER — Telehealth (INDEPENDENT_AMBULATORY_CARE_PROVIDER_SITE_OTHER): Payer: BC Managed Care – PPO | Admitting: Family Medicine

## 2020-03-28 DIAGNOSIS — F902 Attention-deficit hyperactivity disorder, combined type: Secondary | ICD-10-CM

## 2020-03-28 MED ORDER — ADDERALL XR 25 MG PO CP24
25.0000 mg | ORAL_CAPSULE | ORAL | 0 refills | Status: DC
Start: 2020-03-28 — End: 2020-04-27

## 2020-03-28 NOTE — Assessment & Plan Note (Signed)
Discussed options.  We could certainly try a 30-day supply of the 25 mg extended release Adderall.  Did encourage her to check with her insurance to see if we could do 90-day mail order that might help with some of the supply issues.  Or we could even consider switching to something else like Vyvanse which is what her husband takes and he is not had any difficulty with backorder of medications.  New prescription sent to pharmacy we will follow up with a video visit in about 1 month to see how she is doing with the adjusted dose.  She would prefer to hold off on any major changes until possibly a summer break between classes so that she can make sure that she is doing well on the regimen.  Monitor for any side effects.

## 2020-03-28 NOTE — Progress Notes (Signed)
Pt reports that she feels that she may need to be at a higher dose. She stated that the medication does work but it's just not as effective.

## 2020-04-21 IMAGING — MG DIGITAL SCREENING BILATERAL MAMMOGRAM WITH TOMO AND CAD
6 of 10 series · 6 of 30 positions shown · non-contrast
Comparison: None.

CLINICAL DATA: Screening.

EXAM:
DIGITAL SCREENING BILATERAL MAMMOGRAM WITH TOMO AND CAD

[L MLO synth-2D]
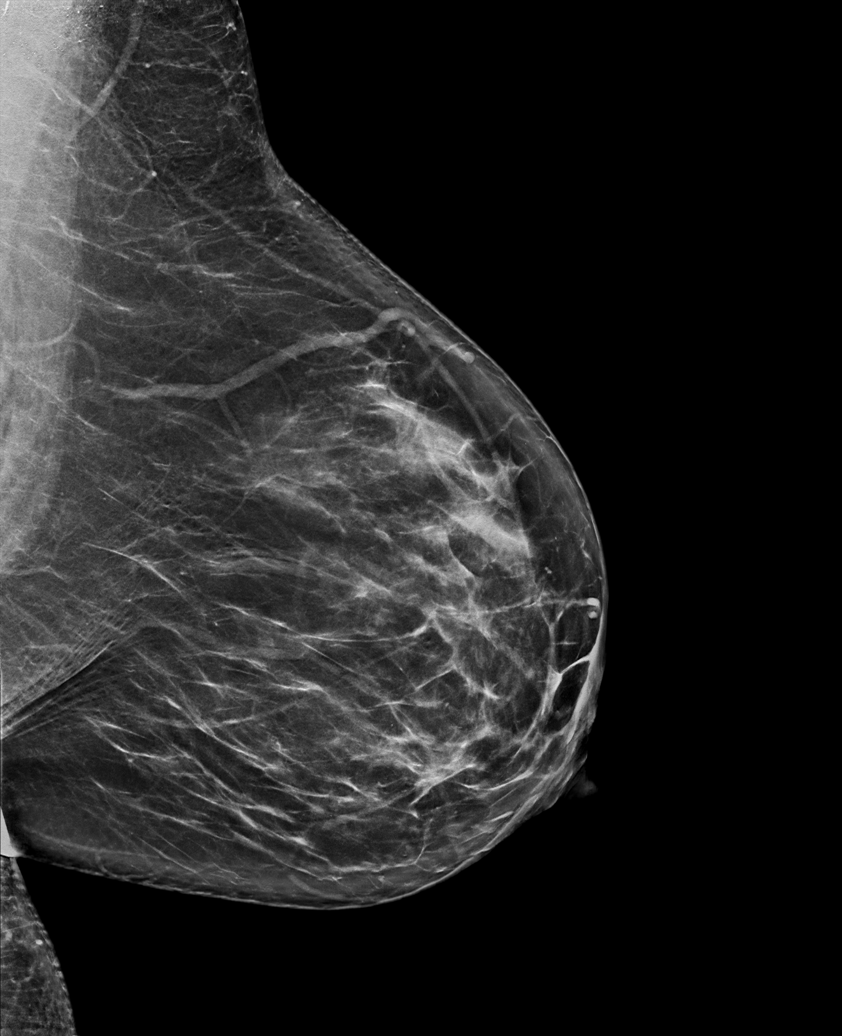

[R CC synth-2D]
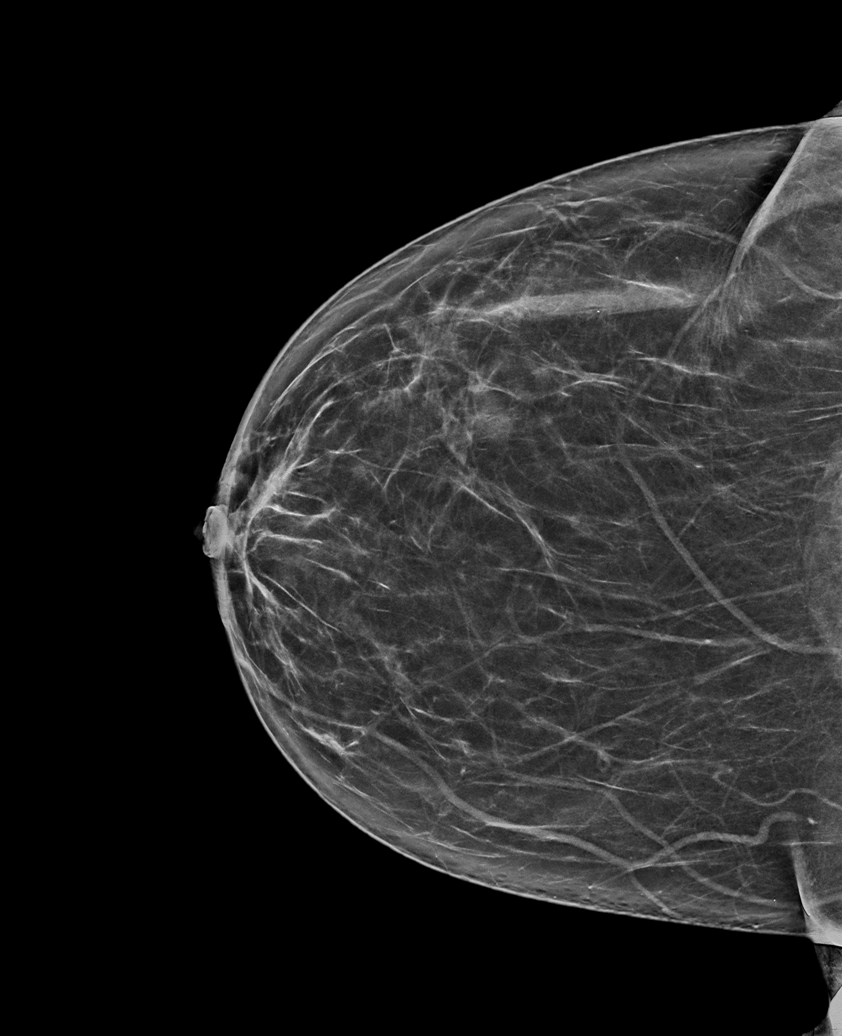

[R MLO synth-2D]
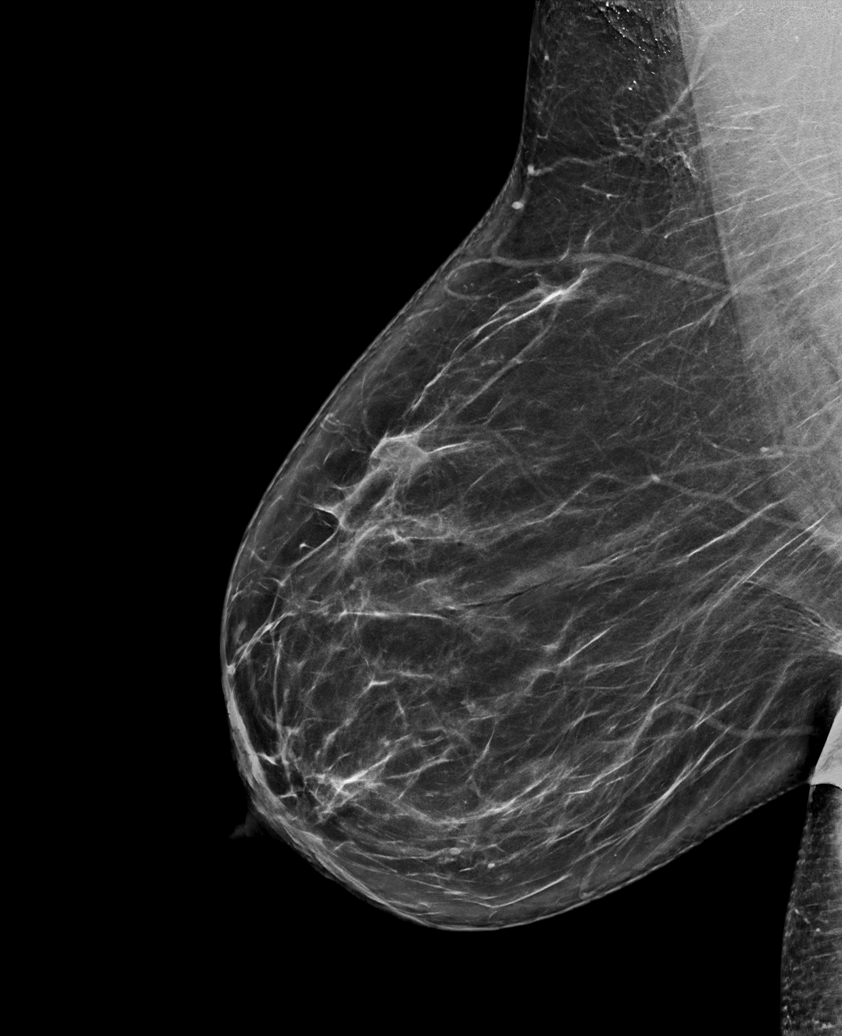

[R XCCL synth-2D]
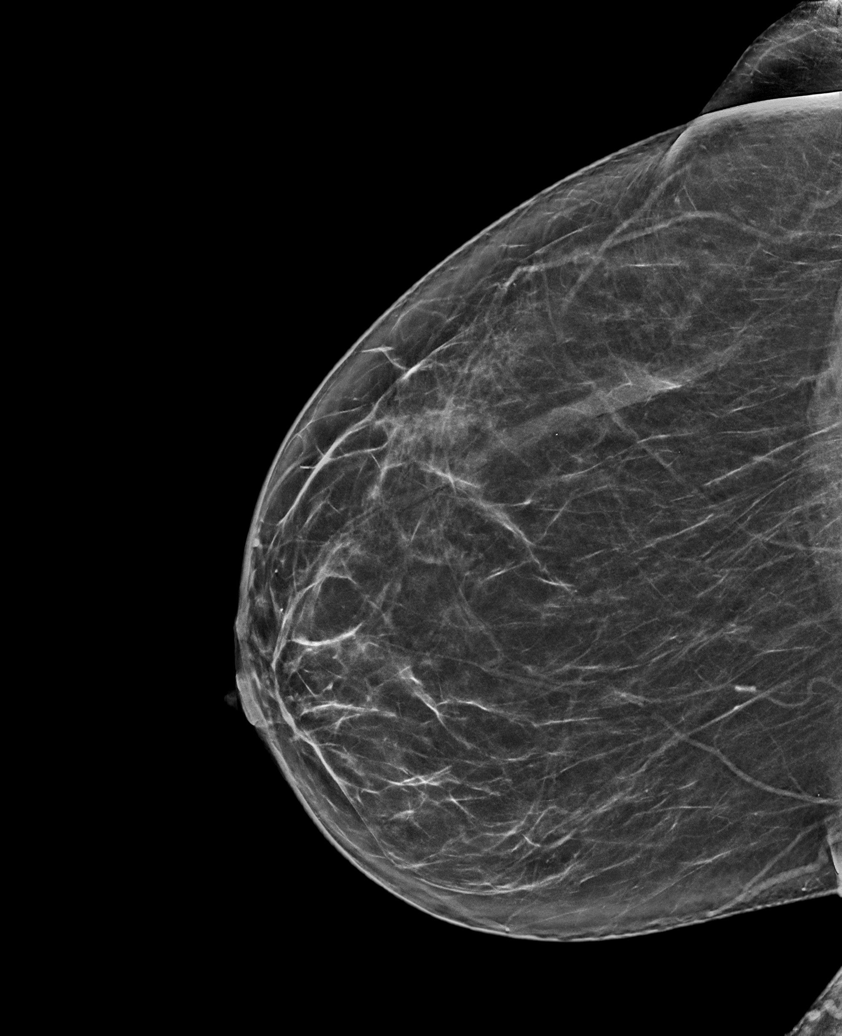

[L CC synth-2D]
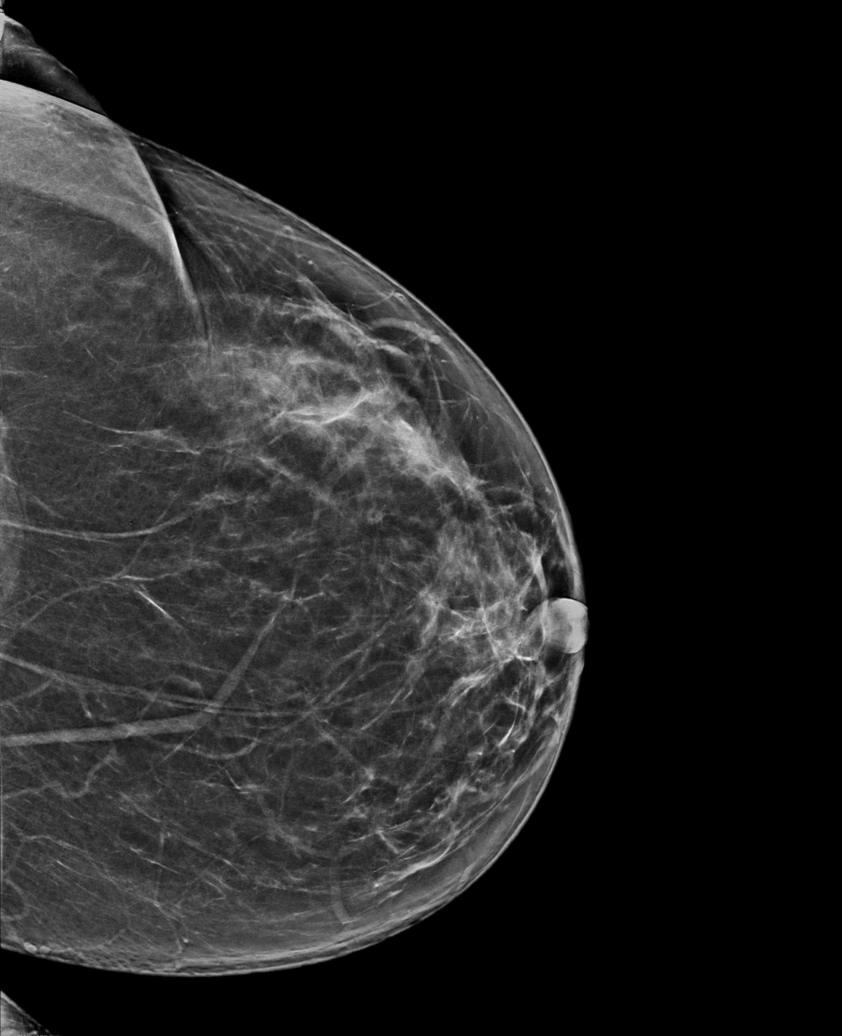

[R MLO tomo · tomo slice 41/82.0]
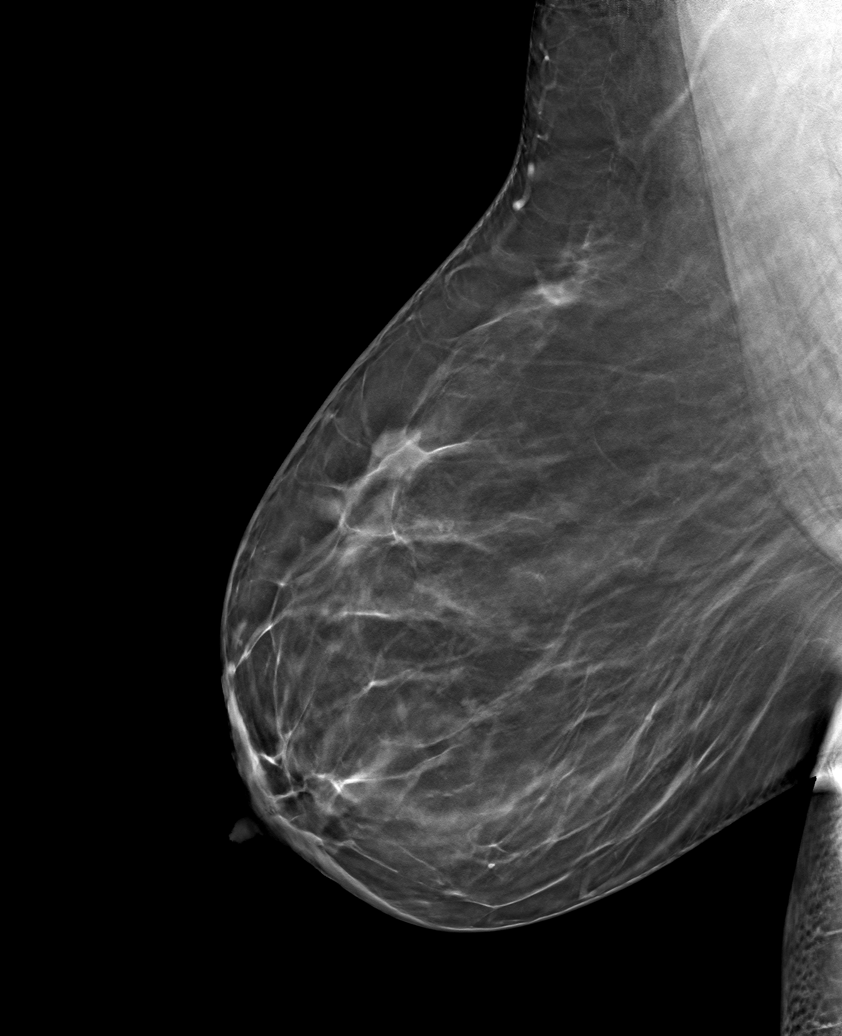

[6 of 30 positions shown; findings below may reference images not displayed]

ACR Breast Density Category b: There are scattered areas of
fibroglandular density.
FINDINGS: There are no findings suspicious for malignancy. Images were
processed with CAD.
IMPRESSION: No mammographic evidence of malignancy. A result letter of this
screening mammogram will be mailed directly to the patient.

RECOMMENDATION:
Screening mammogram in one year. (Code:Y5-G-EJ6)

BI-RADS CATEGORY  1: Negative.

## 2020-04-27 ENCOUNTER — Telehealth (INDEPENDENT_AMBULATORY_CARE_PROVIDER_SITE_OTHER): Payer: BC Managed Care – PPO | Admitting: Family Medicine

## 2020-04-27 ENCOUNTER — Encounter: Payer: Self-pay | Admitting: Family Medicine

## 2020-04-27 VITALS — Wt 265.0 lb

## 2020-04-27 DIAGNOSIS — G43829 Menstrual migraine, not intractable, without status migrainosus: Secondary | ICD-10-CM

## 2020-04-27 DIAGNOSIS — Z803 Family history of malignant neoplasm of breast: Secondary | ICD-10-CM

## 2020-04-27 DIAGNOSIS — F902 Attention-deficit hyperactivity disorder, combined type: Secondary | ICD-10-CM

## 2020-04-27 MED ORDER — ADDERALL XR 25 MG PO CP24: 25.0000 mg | ORAL_CAPSULE | ORAL | 0 refills | Status: DC

## 2020-04-27 NOTE — Progress Notes (Signed)
Virtual Visit via Video Note  I connected with [name] on 04/28/20 at  5:00 PM EDT by a video enabled telemedicine application and verified that I am speaking with the correct person using two identifiers.   I discussed the limitations of evaluation and management by telemedicine and the availability of in person appointments. The patient expressed understanding and agreed to proceed.  Patient location: at home  Provider location: in office  Subjective:    CC: ADD  HPI: She is doing well on the medication.  She is happy with her regimen so far.  Didn't affect her sleep at all.  She has been under a lot of stress recently.  Her eldest daughter has been hospitalized for behavioral health reasons.  She is now back home and a little bit more stable.  [name] is also working and going to school at the same time and so having to really balance a lot on top of having 7 children.  Her migraines have surprisingly actually been fairly well controlled.  Even though she started to have some perimenopausal symptoms.   We also had a discussion today about her family history of colon cancer and breast cancer.  I actually think she be a great candidate for myriad genetic testing.  Past medical history, Surgical history, Family history not pertinant except as noted below, Social history, Allergies, and medications have been entered into the medical record, reviewed, and corrections made.   Review of Systems: No fevers, chills, night sweats, weight loss, chest pain, or shortness of breath.   Objective:    General: Speaking clearly in complete sentences without any shortness of breath.  Alert and oriented x3.  Normal judgment. No apparent acute distress.    Impression and Recommendations:    Attention deficit hyperactivity disorder (ADHD), combined type Well controlled. Continue current regimen. Follow up in  4-6 mo.  90 days supply sent to pharmacy.   Menstrual migraine without status  migrainosus, not intractable Migraines are doing OK overall.   Family history of breast cancer Has had some genetic testing for colon cancer in the past but I think she would benefit from doing the comprehensive myriad genetic evaluation.  We can get her scheduled for a nurse visit at her convenience sometime in the spring or early summer and have her complete the paperwork and send it off to see if insurance would cover additional genetic testing.  This would help better risk stratify her needs and how she would best be served for preventative care.      Time spent in encounter 35 minutes  I discussed the assessment and treatment plan with the patient. The patient was provided an opportunity to ask questions and all were answered. The patient agreed with the plan and demonstrated an understanding of the instructions.   The patient was advised to call back or seek an in-person evaluation if the symptoms worsen or if the condition fails to improve as anticipated.   Doris Lecher, MD

## 2020-04-27 NOTE — Progress Notes (Signed)
Pt reports that she is doing well on the 25 mg.    Pt reports that she had a child that was at [employer]. She reports they are at home now

## 2020-04-28 DIAGNOSIS — Z803 Family history of malignant neoplasm of breast: Secondary | ICD-10-CM | POA: Insufficient documentation

## 2020-04-28 NOTE — Assessment & Plan Note (Signed)
Well controlled. Continue current regimen. Follow up in  4-6 mo.  90 days supply sent to pharmacy.

## 2020-04-28 NOTE — Assessment & Plan Note (Signed)
Migraines are doing OK overall.

## 2020-04-28 NOTE — Assessment & Plan Note (Signed)
Has had some genetic testing for colon cancer in the past but I think she would benefit from doing the comprehensive myriad genetic evaluation.  We can get her scheduled for a nurse visit at her convenience sometime in the spring or early summer and have her complete the paperwork and send it off to see if insurance would cover additional genetic testing.  This would help better risk stratify her needs and how she would best be served for preventative care.

## 2020-05-29 ENCOUNTER — Other Ambulatory Visit: Payer: Self-pay

## 2020-05-29 ENCOUNTER — Ambulatory Visit (INDEPENDENT_AMBULATORY_CARE_PROVIDER_SITE_OTHER): Payer: BC Managed Care – PPO | Admitting: Sports Medicine

## 2020-05-29 DIAGNOSIS — M7741 Metatarsalgia, right foot: Secondary | ICD-10-CM | POA: Diagnosis not present

## 2020-05-29 DIAGNOSIS — M79671 Pain in right foot: Secondary | ICD-10-CM

## 2020-05-29 DIAGNOSIS — G8929 Other chronic pain: Secondary | ICD-10-CM | POA: Diagnosis not present

## 2020-05-29 MED ORDER — PREDNISONE 50 MG PO TABS: ORAL_TABLET | ORAL | 0 refills | Status: DC

## 2020-05-29 NOTE — Assessment & Plan Note (Addendum)
This is a pleasant 45 year old female, she has had a fairly long history of problems with her feet, today it is mostly in the right foot, second MTP and second PIP, plantar aspect, there is significant swelling, no trauma. Adding x-rays, we placed a metatarsal pad. 5 days of prednisone for Return to see me in 2 weeks, we will consider an injection into the MTP or PIP if no better.  Update: Foot x-rays need to be done at [facility].

## 2020-05-29 NOTE — Progress Notes (Addendum)
    Procedures performed today:    None.  Independent interpretation of notes and tests performed by another provider:   None.  Brief History, Exam, Impression, and Recommendations:    Metatarsalgia, right foot This is a pleasant 45 year old female, she has had a fairly long history of problems with her feet, today it is mostly in the right foot, second MTP and second PIP, plantar aspect, there is significant swelling, no trauma. Adding x-rays, we placed a metatarsal pad. 5 days of prednisone for Return to see me in 2 weeks, we will consider an injection into the MTP or PIP if no better.  Update: Foot x-rays need to be done at [facility].    ___________________________________________ Ihor Austin. Benjamin Stain, M.D., ABFM., CAQSM. Primary Care and Sports Medicine [facility] [facility]  Adjunct Instructor of Family Medicine  [employer]

## 2020-05-31 NOTE — Addendum Note (Signed)
Addended by: Monica Becton on: 05/31/2020 09:08 AM   Modules accepted: Orders

## 2020-05-31 NOTE — Telephone Encounter (Signed)
[name] go ahead and send the x-ray order to [facility], looks like in-house imaging is not covered here per her insurance plan.

## 2020-06-07 NOTE — Telephone Encounter (Signed)
Sent!

## 2020-06-12 ENCOUNTER — Other Ambulatory Visit: Payer: Self-pay

## 2020-06-12 ENCOUNTER — Ambulatory Visit (INDEPENDENT_AMBULATORY_CARE_PROVIDER_SITE_OTHER): Payer: BC Managed Care – PPO | Admitting: Sports Medicine

## 2020-06-12 DIAGNOSIS — M7741 Metatarsalgia, right foot: Secondary | ICD-10-CM

## 2020-06-12 NOTE — Progress Notes (Signed)
    Procedures performed today:    None.  Independent interpretation of notes and tests performed by another provider:   None.  Brief History, Exam, Impression, and Recommendations:    Metatarsalgia, right foot Resolved with medium metatarsal pad, no change in plan, return as needed.    ___________________________________________ Ihor Austin. Benjamin Stain, M.D., ABFM., CAQSM. Primary Care and Sports Medicine [place] [facility]  Adjunct Instructor of Family Medicine  [employer]

## 2020-06-12 NOTE — Assessment & Plan Note (Signed)
Resolved with medium metatarsal pad, no change in plan, return as needed.

## 2020-06-13 DIAGNOSIS — R2681 Unsteadiness on feet: Secondary | ICD-10-CM | POA: Diagnosis not present

## 2020-06-13 DIAGNOSIS — M79672 Pain in left foot: Secondary | ICD-10-CM | POA: Diagnosis not present

## 2020-06-13 DIAGNOSIS — M25562 Pain in left knee: Secondary | ICD-10-CM | POA: Diagnosis not present

## 2020-06-13 DIAGNOSIS — M79671 Pain in right foot: Secondary | ICD-10-CM | POA: Diagnosis not present

## 2020-06-14 DIAGNOSIS — Z13220 Encounter for screening for lipoid disorders: Secondary | ICD-10-CM | POA: Diagnosis not present

## 2020-06-14 DIAGNOSIS — Z136 Encounter for screening for cardiovascular disorders: Secondary | ICD-10-CM | POA: Diagnosis not present

## 2020-06-14 DIAGNOSIS — Z0130 Encounter for examination of blood pressure without abnormal findings: Secondary | ICD-10-CM | POA: Diagnosis not present

## 2020-06-14 DIAGNOSIS — Z713 Dietary counseling and surveillance: Secondary | ICD-10-CM | POA: Diagnosis not present

## 2020-06-14 DIAGNOSIS — Z131 Encounter for screening for diabetes mellitus: Secondary | ICD-10-CM | POA: Diagnosis not present

## 2020-06-20 DIAGNOSIS — R2681 Unsteadiness on feet: Secondary | ICD-10-CM | POA: Diagnosis not present

## 2020-06-20 DIAGNOSIS — M25562 Pain in left knee: Secondary | ICD-10-CM | POA: Diagnosis not present

## 2020-06-20 DIAGNOSIS — M79672 Pain in left foot: Secondary | ICD-10-CM | POA: Diagnosis not present

## 2020-06-20 DIAGNOSIS — M79671 Pain in right foot: Secondary | ICD-10-CM | POA: Diagnosis not present

## 2020-06-22 DIAGNOSIS — M25562 Pain in left knee: Secondary | ICD-10-CM | POA: Diagnosis not present

## 2020-06-22 DIAGNOSIS — R2681 Unsteadiness on feet: Secondary | ICD-10-CM | POA: Diagnosis not present

## 2020-06-22 DIAGNOSIS — M79671 Pain in right foot: Secondary | ICD-10-CM | POA: Diagnosis not present

## 2020-06-22 DIAGNOSIS — M79672 Pain in left foot: Secondary | ICD-10-CM | POA: Diagnosis not present

## 2020-06-27 DIAGNOSIS — R2681 Unsteadiness on feet: Secondary | ICD-10-CM | POA: Diagnosis not present

## 2020-06-27 DIAGNOSIS — M25562 Pain in left knee: Secondary | ICD-10-CM | POA: Diagnosis not present

## 2020-06-27 DIAGNOSIS — M79671 Pain in right foot: Secondary | ICD-10-CM | POA: Diagnosis not present

## 2020-06-27 DIAGNOSIS — M79672 Pain in left foot: Secondary | ICD-10-CM | POA: Diagnosis not present

## 2020-06-28 ENCOUNTER — Telehealth: Payer: Self-pay | Admitting: *Deleted

## 2020-06-28 ENCOUNTER — Encounter: Payer: Self-pay | Admitting: Family Medicine

## 2020-06-28 NOTE — Telephone Encounter (Signed)
Pt left vm today stating that it's time for her to fill her 3rd Adderall rx but it's dated 6/24 and her previous rx was dated 5/5.  Is it ok for the pharmacy to go ahead and fill it?  Seemed like an error in the dating of the last prescription.  Please advise.

## 2020-06-29 DIAGNOSIS — M25562 Pain in left knee: Secondary | ICD-10-CM | POA: Diagnosis not present

## 2020-06-29 DIAGNOSIS — R2681 Unsteadiness on feet: Secondary | ICD-10-CM | POA: Diagnosis not present

## 2020-06-29 DIAGNOSIS — M79672 Pain in left foot: Secondary | ICD-10-CM | POA: Diagnosis not present

## 2020-06-29 DIAGNOSIS — M79671 Pain in right foot: Secondary | ICD-10-CM | POA: Diagnosis not present

## 2020-06-29 MED ORDER — ADDERALL XR 25 MG PO CP24: 25.0000 mg | ORAL_CAPSULE | ORAL | 0 refills | Status: DC

## 2020-06-29 NOTE — Telephone Encounter (Signed)
Meds ordered this encounter  Medications   ADDERALL XR 25 MG 24 hr capsule    Sig: Take 1 capsule by mouth every morning.    Dispense:  30 capsule    Refill:  0

## 2020-06-30 NOTE — Telephone Encounter (Signed)
Notified pt of refill through mychart.

## 2020-07-04 DIAGNOSIS — M79671 Pain in right foot: Secondary | ICD-10-CM | POA: Diagnosis not present

## 2020-07-04 DIAGNOSIS — R2681 Unsteadiness on feet: Secondary | ICD-10-CM | POA: Diagnosis not present

## 2020-07-04 DIAGNOSIS — M79672 Pain in left foot: Secondary | ICD-10-CM | POA: Diagnosis not present

## 2020-07-04 DIAGNOSIS — M25562 Pain in left knee: Secondary | ICD-10-CM | POA: Diagnosis not present

## 2020-07-06 DIAGNOSIS — R2681 Unsteadiness on feet: Secondary | ICD-10-CM | POA: Diagnosis not present

## 2020-07-06 DIAGNOSIS — M79671 Pain in right foot: Secondary | ICD-10-CM | POA: Diagnosis not present

## 2020-07-06 DIAGNOSIS — M79672 Pain in left foot: Secondary | ICD-10-CM | POA: Diagnosis not present

## 2020-07-06 DIAGNOSIS — M25562 Pain in left knee: Secondary | ICD-10-CM | POA: Diagnosis not present

## 2020-07-18 DIAGNOSIS — M25562 Pain in left knee: Secondary | ICD-10-CM | POA: Diagnosis not present

## 2020-07-18 DIAGNOSIS — M79671 Pain in right foot: Secondary | ICD-10-CM | POA: Diagnosis not present

## 2020-07-18 DIAGNOSIS — M79672 Pain in left foot: Secondary | ICD-10-CM | POA: Diagnosis not present

## 2020-07-18 DIAGNOSIS — R2681 Unsteadiness on feet: Secondary | ICD-10-CM | POA: Diagnosis not present

## 2020-07-20 ENCOUNTER — Telehealth (INDEPENDENT_AMBULATORY_CARE_PROVIDER_SITE_OTHER): Payer: BC Managed Care – PPO | Admitting: Family Medicine

## 2020-07-20 ENCOUNTER — Encounter: Payer: Self-pay | Admitting: Family Medicine

## 2020-07-20 DIAGNOSIS — M25562 Pain in left knee: Secondary | ICD-10-CM | POA: Diagnosis not present

## 2020-07-20 DIAGNOSIS — R635 Abnormal weight gain: Secondary | ICD-10-CM | POA: Diagnosis not present

## 2020-07-20 DIAGNOSIS — E8881 Metabolic syndrome: Secondary | ICD-10-CM

## 2020-07-20 DIAGNOSIS — R030 Elevated blood-pressure reading, without diagnosis of hypertension: Secondary | ICD-10-CM

## 2020-07-20 DIAGNOSIS — F902 Attention-deficit hyperactivity disorder, combined type: Secondary | ICD-10-CM | POA: Diagnosis not present

## 2020-07-20 DIAGNOSIS — M7741 Metatarsalgia, right foot: Secondary | ICD-10-CM

## 2020-07-20 DIAGNOSIS — M79672 Pain in left foot: Secondary | ICD-10-CM | POA: Diagnosis not present

## 2020-07-20 DIAGNOSIS — R2681 Unsteadiness on feet: Secondary | ICD-10-CM | POA: Diagnosis not present

## 2020-07-20 DIAGNOSIS — M79671 Pain in right foot: Secondary | ICD-10-CM | POA: Diagnosis not present

## 2020-07-20 MED ORDER — ADDERALL XR 25 MG PO CP24: 25.0000 mg | ORAL_CAPSULE | ORAL | 0 refills | Status: DC

## 2020-07-20 NOTE — Assessment & Plan Note (Signed)
Doing PT and has a insert and is getting better.

## 2020-07-20 NOTE — Assessment & Plan Note (Signed)
Well controlled. Continue current regimen. Follow up in  4 mo 

## 2020-07-20 NOTE — Assessment & Plan Note (Signed)
She has joined a program for weight loss. He husband has joined as well.  Plan to check TSH at f/u

## 2020-07-20 NOTE — Assessment & Plan Note (Signed)
Will monitor. Hopefully will come down with weight loss. If not then plan to address ADHD meds as potential cause.

## 2020-07-20 NOTE — Progress Notes (Signed)
Virtual Visit via Video Note  I connected with [name] on 07/20/20 at  8:10 AM EDT by a video enabled telemedicine application and verified that I am speaking with the correct person using two identifiers.   I discussed the limitations of evaluation and management by telemedicine and the availability of in person appointments. The patient expressed understanding and agreed to proceed.  Patient location: at home Provider location: in office  Subjective:    CC: ADHD meds  HPI: ADHD - Reports symptoms are well controlled on current regime. Denies any problems with insomnia, chest pain, palpitations, or SOB.    Weight - eats [phone] steps per day. She started a weight loss program with her husband as well.  Goal is lose about 3-5% in 6 o.  She is doing PT for her metatarsalgia and that has helped as well.  Going to [facility] in [place].   Monitoring BP- it was mildly elevated when went for her physicals. It was  Triglycerdies were mildly elevated.        Past medical history, Surgical history, Family history not pertinant except as noted below, Social history, Allergies, and medications have been entered into the medical record, reviewed, and corrections made.    Objective:    General: Speaking clearly in complete sentences without any shortness of breath.  Alert and oriented x3.  Normal judgment. No apparent acute distress.    Impression and Recommendations:    Attention deficit hyperactivity disorder (ADHD), combined type Well controlled. Continue current regimen. Follow up in  4 mo   Insulin resistance Plan to recheck A1c at f/u in 3 mo  Abnormal weight gain She has joined a program for weight loss. He husband has joined as well.  Plan to check TSH at f/u  Metatarsalgia, right foot Doing PT and has a insert and is getting better.   Elevated BP without diagnosis of hypertension Will monitor. Hopefully will come down with weight loss. If not then plan to address  ADHD meds as potential cause.     No orders of the defined types were placed in this encounter.   Meds ordered this encounter  Medications   ADDERALL XR 25 MG 24 hr capsule    Sig: Take 1 capsule by mouth every morning.    Dispense:  30 capsule    Refill:  0   ADDERALL XR 25 MG 24 hr capsule    Sig: Take 1 capsule by mouth every morning.    Dispense:  30 capsule    Refill:  0   ADDERALL XR 25 MG 24 hr capsule    Sig: Take 1 capsule by mouth every morning.    Dispense:  30 capsule    Refill:  0     I discussed the assessment and treatment plan with the patient. The patient was provided an opportunity to ask questions and all were answered. The patient agreed with the plan and demonstrated an understanding of the instructions.   The patient was advised to call back or seek an in-person evaluation if the symptoms worsen or if the condition fails to improve as anticipated.   Nani Gasser, MD

## 2020-07-20 NOTE — Assessment & Plan Note (Signed)
Plan to recheck A1c at f/u in 3 mo

## 2020-07-26 DIAGNOSIS — M79672 Pain in left foot: Secondary | ICD-10-CM | POA: Diagnosis not present

## 2020-07-26 DIAGNOSIS — M25562 Pain in left knee: Secondary | ICD-10-CM | POA: Diagnosis not present

## 2020-07-26 DIAGNOSIS — M79671 Pain in right foot: Secondary | ICD-10-CM | POA: Diagnosis not present

## 2020-07-26 DIAGNOSIS — R2681 Unsteadiness on feet: Secondary | ICD-10-CM | POA: Diagnosis not present

## 2020-07-28 DIAGNOSIS — M79671 Pain in right foot: Secondary | ICD-10-CM | POA: Diagnosis not present

## 2020-07-28 DIAGNOSIS — M25562 Pain in left knee: Secondary | ICD-10-CM | POA: Diagnosis not present

## 2020-07-28 DIAGNOSIS — R2681 Unsteadiness on feet: Secondary | ICD-10-CM | POA: Diagnosis not present

## 2020-07-28 DIAGNOSIS — M79672 Pain in left foot: Secondary | ICD-10-CM | POA: Diagnosis not present

## 2020-08-02 DIAGNOSIS — M79671 Pain in right foot: Secondary | ICD-10-CM | POA: Diagnosis not present

## 2020-08-02 DIAGNOSIS — R2681 Unsteadiness on feet: Secondary | ICD-10-CM | POA: Diagnosis not present

## 2020-08-02 DIAGNOSIS — M25562 Pain in left knee: Secondary | ICD-10-CM | POA: Diagnosis not present

## 2020-08-02 DIAGNOSIS — M79672 Pain in left foot: Secondary | ICD-10-CM | POA: Diagnosis not present

## 2020-08-09 DIAGNOSIS — M25562 Pain in left knee: Secondary | ICD-10-CM | POA: Diagnosis not present

## 2020-08-09 DIAGNOSIS — M79671 Pain in right foot: Secondary | ICD-10-CM | POA: Diagnosis not present

## 2020-08-09 DIAGNOSIS — R2681 Unsteadiness on feet: Secondary | ICD-10-CM | POA: Diagnosis not present

## 2020-08-09 DIAGNOSIS — M79672 Pain in left foot: Secondary | ICD-10-CM | POA: Diagnosis not present

## 2020-08-14 DIAGNOSIS — M79672 Pain in left foot: Secondary | ICD-10-CM | POA: Diagnosis not present

## 2020-08-14 DIAGNOSIS — M79671 Pain in right foot: Secondary | ICD-10-CM | POA: Diagnosis not present

## 2020-08-14 DIAGNOSIS — R2681 Unsteadiness on feet: Secondary | ICD-10-CM | POA: Diagnosis not present

## 2020-08-14 DIAGNOSIS — M25562 Pain in left knee: Secondary | ICD-10-CM | POA: Diagnosis not present

## 2020-08-16 DIAGNOSIS — R051 Acute cough: Secondary | ICD-10-CM | POA: Diagnosis not present

## 2020-08-16 DIAGNOSIS — Z20822 Contact with and (suspected) exposure to covid-19: Secondary | ICD-10-CM | POA: Diagnosis not present

## 2020-08-16 DIAGNOSIS — F909 Attention-deficit hyperactivity disorder, unspecified type: Secondary | ICD-10-CM | POA: Diagnosis not present

## 2020-08-16 DIAGNOSIS — R519 Headache, unspecified: Secondary | ICD-10-CM | POA: Diagnosis not present

## 2020-08-24 DIAGNOSIS — M25562 Pain in left knee: Secondary | ICD-10-CM | POA: Diagnosis not present

## 2020-08-24 DIAGNOSIS — M79672 Pain in left foot: Secondary | ICD-10-CM | POA: Diagnosis not present

## 2020-08-24 DIAGNOSIS — R2681 Unsteadiness on feet: Secondary | ICD-10-CM | POA: Diagnosis not present

## 2020-08-24 DIAGNOSIS — M79671 Pain in right foot: Secondary | ICD-10-CM | POA: Diagnosis not present

## 2020-08-30 DIAGNOSIS — M79671 Pain in right foot: Secondary | ICD-10-CM | POA: Diagnosis not present

## 2020-08-30 DIAGNOSIS — R2681 Unsteadiness on feet: Secondary | ICD-10-CM | POA: Diagnosis not present

## 2020-08-30 DIAGNOSIS — M25562 Pain in left knee: Secondary | ICD-10-CM | POA: Diagnosis not present

## 2020-08-30 DIAGNOSIS — M79672 Pain in left foot: Secondary | ICD-10-CM | POA: Diagnosis not present

## 2020-09-06 DIAGNOSIS — M25562 Pain in left knee: Secondary | ICD-10-CM | POA: Diagnosis not present

## 2020-09-06 DIAGNOSIS — M79671 Pain in right foot: Secondary | ICD-10-CM | POA: Diagnosis not present

## 2020-09-06 DIAGNOSIS — M79672 Pain in left foot: Secondary | ICD-10-CM | POA: Diagnosis not present

## 2020-09-06 DIAGNOSIS — R2681 Unsteadiness on feet: Secondary | ICD-10-CM | POA: Diagnosis not present

## 2020-09-20 DIAGNOSIS — M79671 Pain in right foot: Secondary | ICD-10-CM | POA: Diagnosis not present

## 2020-09-20 DIAGNOSIS — R2681 Unsteadiness on feet: Secondary | ICD-10-CM | POA: Diagnosis not present

## 2020-09-20 DIAGNOSIS — M79672 Pain in left foot: Secondary | ICD-10-CM | POA: Diagnosis not present

## 2020-09-20 DIAGNOSIS — M25562 Pain in left knee: Secondary | ICD-10-CM | POA: Diagnosis not present

## 2020-09-27 DIAGNOSIS — M25562 Pain in left knee: Secondary | ICD-10-CM | POA: Diagnosis not present

## 2020-09-27 DIAGNOSIS — M79672 Pain in left foot: Secondary | ICD-10-CM | POA: Diagnosis not present

## 2020-09-27 DIAGNOSIS — M79671 Pain in right foot: Secondary | ICD-10-CM | POA: Diagnosis not present

## 2020-09-27 DIAGNOSIS — R2681 Unsteadiness on feet: Secondary | ICD-10-CM | POA: Diagnosis not present

## 2020-10-04 DIAGNOSIS — M25562 Pain in left knee: Secondary | ICD-10-CM | POA: Diagnosis not present

## 2020-10-04 DIAGNOSIS — R2681 Unsteadiness on feet: Secondary | ICD-10-CM | POA: Diagnosis not present

## 2020-10-04 DIAGNOSIS — M79672 Pain in left foot: Secondary | ICD-10-CM | POA: Diagnosis not present

## 2020-10-04 DIAGNOSIS — M79671 Pain in right foot: Secondary | ICD-10-CM | POA: Diagnosis not present

## 2020-11-08 ENCOUNTER — Encounter: Payer: Self-pay | Admitting: Family Medicine

## 2020-11-08 MED ORDER — ELETRIPTAN HYDROBROMIDE 20 MG PO TABS: 20.0000 mg | ORAL_TABLET | ORAL | 1 refills | Status: DC | PRN

## 2020-11-23 ENCOUNTER — Ambulatory Visit (INDEPENDENT_AMBULATORY_CARE_PROVIDER_SITE_OTHER): Payer: No Typology Code available for payment source | Admitting: Family Medicine

## 2020-11-23 ENCOUNTER — Encounter: Payer: Self-pay | Admitting: Family Medicine

## 2020-11-23 ENCOUNTER — Telehealth: Payer: Self-pay | Admitting: Family Medicine

## 2020-11-23 ENCOUNTER — Other Ambulatory Visit: Payer: Self-pay

## 2020-11-23 VITALS — BP 136/70 | HR 92 | Ht 66.0 in | Wt 275.0 lb

## 2020-11-23 DIAGNOSIS — I10 Essential (primary) hypertension: Secondary | ICD-10-CM

## 2020-11-23 DIAGNOSIS — F902 Attention-deficit hyperactivity disorder, combined type: Secondary | ICD-10-CM

## 2020-11-23 DIAGNOSIS — Z23 Encounter for immunization: Secondary | ICD-10-CM | POA: Diagnosis not present

## 2020-11-23 DIAGNOSIS — E8881 Metabolic syndrome: Secondary | ICD-10-CM

## 2020-11-23 DIAGNOSIS — F5102 Adjustment insomnia: Secondary | ICD-10-CM

## 2020-11-23 DIAGNOSIS — R635 Abnormal weight gain: Secondary | ICD-10-CM

## 2020-11-23 DIAGNOSIS — R058 Other specified cough: Secondary | ICD-10-CM

## 2020-11-23 MED ORDER — ADDERALL XR 25 MG PO CP24: 25.0000 mg | ORAL_CAPSULE | ORAL | 0 refills | Status: DC

## 2020-11-23 MED ORDER — LISINOPRIL 10 MG PO TABS: 10.0000 mg | ORAL_TABLET | Freq: Every day | ORAL | 3 refills | Status: DC

## 2020-11-23 NOTE — Patient Instructions (Signed)
[name] for weight loss.

## 2020-11-23 NOTE — Telephone Encounter (Signed)
Pls call pt: I would love for her to go for fasting labs once on new BP meds for 2 weeks. Not sure what is in her network.

## 2020-11-23 NOTE — Progress Notes (Signed)
Sa  Established Patient Office Visit  Subjective:  Patient ID: [name], female    DOB: [birth year]  Age: 45 y.o. MRN: [id number]  CC:  Chief Complaint  Patient presents with   Hypertension    HPI [name] presents for   ADHD - Reports symptoms are well controlled on current regime. Denies any problems with insomnia, chest pain, palpitations, or SOB.  Appetite really ramps up at night while studying and as ADD med wears off.    Stress levels have been very high. 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  Taking care of her family and children.  Son [name] is have major difficulties behaviorally. They have a hard time getting him fully diagnosed bc he won't cooperate.  Only getting about 5-6 hours of sleep most nights. Sometimes less.  Often stays up to complete her school work.    Whole family had th flu 2 weeks ago.  She is much better but still having some head congestion and cough.  Using Sudafed and that does help.    Past Medical History:  Diagnosis Date   ACL (anterior cruciate ligament) tear    MCL. 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    Colon cancer Mother 27       died at age 9   Colon polyps Mother    Colon cancer Maternal Grandmother 57   Colon cancer Other 57       mat great grand mother   Lung cancer Father 64       smoker   Heart attack Maternal Grandfather    Hypertension Maternal Grandfather    Diabetes Paternal Grandmother        type 1   Breast cancer Maternal Aunt 20   Ovarian cancer Maternal Aunt    Other Maternal Aunt        reproductive issues   Colon cancer Maternal Uncle    Chiari malformation Sister     Social History   Socioeconomic History   Marital status: Married    Spouse name: [name]   Number of children: 8   Years of education: college   Highest education level: Not on file  Occupational History   Occupation: Homemaker  Tobacco Use   Smoking status: Former    Types: Cigarettes  Quit date: 01/27/2004    Years since quitting: 16.8   Smokeless tobacco: Never  Vaping Use   Vaping Use: Never used  Substance and Sexual Activity   Alcohol use: No   Drug use: No   Sexual activity: Yes    Partners: Male    Birth control/protection: Other-see comments    Comment: vasectomy  Other Topics Concern   Not on file  Social History Narrative   Not on file   Social Determinants of Health   Financial Resource Strain: Not on file  Food Insecurity: Not on file  Transportation Needs: Not on file  Physical Activity: Not on file  Stress: Not on file  Social Connections: Not on file  Intimate Partner Violence: Not on file    Outpatient Medications Prior to Visit  Medication Sig Dispense Refill   eletriptan (RELPAX) 20 MG tablet Take 1-2 tablets (20-40 mg total) by mouth as needed for migraine or headache. May repeat in 2 hours if headache persists or recurs. 9 tablet 1   fluticasone (FLONASE) 50 MCG/ACT nasal spray Place 1 spray into both nostrils daily as needed for allergies or rhinitis.     Multiple Vitamin  (MULTIVITAMIN) tablet Take 1 tablet by mouth daily.     ADDERALL XR 25 MG 24 hr capsule Take 1 capsule by mouth every morning. 30 capsule 0   ADDERALL XR 25 MG 24 hr capsule Take 1 capsule by mouth every morning. 30 capsule 0   ADDERALL XR 25 MG 24 hr capsule Take 1 capsule by mouth every morning. 30 capsule 0   No facility-administered medications prior to visit.    Allergies  Allergen Reactions   Dairy Aid [Tilactase] Other (See Comments)    Excess gas, bloating, diarrhea   Nickel    Phentermine Other (See Comments)    Heart murmur started   Topamax [Topiramate] Other (See Comments)    Memory loss    ROS Review of Systems    Objective:    Physical Exam Constitutional:      Appearance: Normal appearance. She is well-developed.  HENT:     Head: Normocephalic and atraumatic.  Cardiovascular:     Rate and Rhythm: Normal rate and regular rhythm.     Heart sounds: Normal heart sounds.  Pulmonary:     Effort: Pulmonary effort is normal.     Breath sounds: Normal breath sounds.  Skin:    General: Skin is warm and dry.  Neurological:     Mental Status: She is alert and oriented to person, place, and time.  Psychiatric:        Behavior: Behavior normal.   BP 136/70   Pulse 92   Ht 5\' 6"  (1.676 m)   Wt 275 lb (124.7 kg)   SpO2 100%   BMI 44.39 kg/m  Wt Readings from Last 3 Encounters:  11/23/20 275 lb (124.7 kg)  04/27/20 265 lb (120.2 kg)  03/28/20 268 lb (121.6 kg)     Health Maintenance Due  Topic Date Due   Hepatitis C Screening  Never done   COVID-19 Vaccine (3 - Booster) 05/18/2019    There are no preventive care reminders to display for this patient.  Lab Results  Component Value Date   TSH 1.300 11/11/2017   Lab Results  Component Value Date   WBC 13.5 (H) 11/18/2017   HGB 12.3 11/18/2017   HCT 38.8 11/18/2017   MCV 82.7 11/18/2017   PLT 412 (H) 11/18/2017   Lab Results  Component Value Date   NA 142 03/23/2018   K 4.4 03/23/2018   CO2  24 03/23/2018   GLUCOSE 83 03/23/2018   BUN 13 03/23/2018   CREATININE 0.73 03/23/2018   BILITOT 0.4 03/23/2018   ALKPHOS 66 03/23/2018   AST 10 03/23/2018   ALT 14 03/23/2018   PROT 7.3 03/23/2018   ALBUMIN 4.4 03/23/2018   CALCIUM 9.4 03/23/2018   ANIONGAP 8 11/18/2017   Lab Results  Component Value Date   CHOL 163 11/11/2017   Lab Results  Component Value Date   HDL 41 11/11/2017   Lab Results  Component Value Date   LDLCALC 96 11/11/2017   Lab Results  Component Value Date   TRIG 128 11/11/2017   No results found for: [facility] Lab Results  Component Value Date   HGBA1C 5.1 03/23/2018      Assessment & Plan:   Problem List Items Addressed This Visit       Cardiovascular and Mediastinum   Essential hypertension    New dx.  Stimulant could be contributing.  Discussed options. Will start lisinopril.  Check labs in about 2 weeks.        Relevant Medications   lisinopril (ZESTRIL) 10 MG tablet   Other Relevant Orders   Lipid Panel w/reflex Direct LDL   COMPLETE METABOLIC PANEL WITH GFR   CBC   Hemoglobin A1c     Endocrine   Insulin resistance   Relevant Orders   Hemoglobin A1c     Other   Attention deficit hyperactivity disorder (ADHD), combined type - Primary    Prefers Vyvanse but insurance won't cover.  Will refill Adderall.  OK for 90 days per insurance.  Did discuss prn use of short acting in early afternoon if needed as long as not impacting sleep quality.       Relevant Medications   ADDERALL XR 25 MG 24 hr capsule   Adjustment insomnia    Really not getting enough rest.  Working and doing school.  Discussed possible trial of sleep aid in future for prn use if desired.        Other Visit Diagnoses     Need for immunization against influenza       Relevant Orders   Flu Vaccine QUAD 44mo+IM (Fluarix, Fluzone & Alfiuria Quad PF) (Completed)   Post-viral cough syndrome           If cough and congestion don't improve after the weekend  consider ABX.   Meds ordered this encounter  Medications   ADDERALL XR 25 MG 24 hr capsule    Sig: Take 1 capsule by mouth every morning.    Dispense:  90 capsule    Refill:  0   lisinopril (ZESTRIL) 10 MG tablet    Sig: Take 1 tablet (10 mg total) by mouth daily.    Dispense:  90 tablet    Refill:  3    Follow-up: No follow-ups on file.    Nani Gasser, MD

## 2020-11-23 NOTE — Assessment & Plan Note (Signed)
New dx.  Stimulant could be contributing.  Discussed options. Will start lisinopril.  Check labs in about 2 weeks.

## 2020-11-23 NOTE — Assessment & Plan Note (Signed)
Prefers Vyvanse but Manufacturing engineer.  Will refill Adderall.  OK for 90 days per insurance.  Did discuss prn use of short acting in early afternoon if needed as long as not impacting sleep quality.

## 2020-11-23 NOTE — Assessment & Plan Note (Signed)
Really not getting enough rest.  Working and doing school.  Discussed possible trial of sleep aid in future for prn use if desired.

## 2020-11-24 NOTE — Telephone Encounter (Signed)
Pt states that her insurance will only cover Ozempic as a GLP1, but that other medications have to be tried first.  She is unsure what medications those are though.  Pt informed of need for labs, she expressed understanding and is agreeable.  However, she states that the pharmacy did not have the medication in stock and she will have the labs done 2-3 weeks after she starts on it.  Pt also stated that she needs to have her labs done at [facility] for her insurance.  Labs reordered for [name], CMA

## 2020-11-24 NOTE — Telephone Encounter (Signed)
Okay, it sounds like they are only paying for the diabetic version which means she probably has to have tried multiple diabetes medications before they will pay for a GLP-1.  Thank you!

## 2020-11-24 NOTE — Telephone Encounter (Signed)
Pt advised of findings.  She asked about doing the Qsymia again? She is willing to try something else that is suggested. She can do up to $150 a month for any of these prescriptions.  She checked her insurance but nothing is really covered under her plan.   I advised her to go online to look up Contrave she can get directly from their manufacturer which is cheaper, she also can get the Qsymia directly also.   Also suggested Plenity and she would be ok with this for the 12 wk supply. (she understands that this supply will be a little more but would be ok with this).    Will fwd to pcp for advice.

## 2020-11-27 ENCOUNTER — Encounter: Payer: Self-pay | Admitting: Family Medicine

## 2020-11-27 MED ORDER — PLENITY PO CAPS: 3.0000 | ORAL_CAPSULE | Freq: Two times a day (BID) | ORAL | 1 refills | Status: DC

## 2020-11-27 NOTE — Telephone Encounter (Signed)
Rx sent for Plenity. See if can use coupon card and see if reasonable.  Can go online for card.  If still not covered or > 150 then pls let me know.

## 2020-11-27 NOTE — Telephone Encounter (Signed)
Pt informed.   She also wanted to let Dr. Linford Arnold know that there is a nationwide shortage of Adderall and [facility] is not certain as to when they will be able to get this.  I asked if she would be ok with switching to something else she stated that she would be ok if that has to be done.   Will need to reorder the Plenity and fax this to [facility]. It has to go thru their pharmacy for her to obtain the discount. * this can be e-scribed. Will send this way.

## 2020-11-28 MED ORDER — DEXMETHYLPHENIDATE HCL ER 15 MG PO CP24: 15.0000 mg | ORAL_CAPSULE | Freq: Every day | ORAL | 0 refills | Status: DC

## 2020-11-28 NOTE — Addendum Note (Signed)
Addended by: Nani Gasser D on: 11/28/2020 07:42 AM   Modules accepted: Orders

## 2020-11-28 NOTE — Telephone Encounter (Signed)
LVM informing pt of RXs.  T. Theola Cuellar, CMA 

## 2020-11-28 NOTE — Telephone Encounter (Signed)
Meds ordered this encounter  Medications   DISCONTD: Carboxymeth-Cellulose-CitricAc (PLENITY) CAPS    Sig: Take 3 capsules by mouth 2 (two) times daily.    Dispense:  180 capsule    Refill:  1    Pld run with coupon card   Carboxymeth-Cellulose-CitricAc (PLENITY) CAPS    Sig: Take 3 capsules by mouth 2 (two) times daily.    Dispense:  180 capsule    Refill:  1   dexmethylphenidate (FOCALIN XR) 15 MG 24 hr capsule    Sig: Take 1 capsule (15 mg total) by mouth daily.    Dispense:  30 capsule    Refill:  0   SEnt in focalin instead to see if they have it. We can try for 30 days.

## 2021-02-09 ENCOUNTER — Encounter: Payer: Self-pay | Admitting: Family Medicine

## 2021-02-09 DIAGNOSIS — Z6841 Body Mass Index (BMI) 40.0 and over, adult: Secondary | ICD-10-CM

## 2021-02-09 DIAGNOSIS — Z1329 Encounter for screening for other suspected endocrine disorder: Secondary | ICD-10-CM

## 2021-02-09 DIAGNOSIS — E88819 Insulin resistance, unspecified: Secondary | ICD-10-CM

## 2021-02-09 DIAGNOSIS — I10 Essential (primary) hypertension: Secondary | ICD-10-CM

## 2021-02-09 DIAGNOSIS — Z0000 Encounter for general adult medical examination without abnormal findings: Secondary | ICD-10-CM

## 2021-02-09 DIAGNOSIS — Z131 Encounter for screening for diabetes mellitus: Secondary | ICD-10-CM

## 2021-02-09 DIAGNOSIS — Z13220 Encounter for screening for lipoid disorders: Secondary | ICD-10-CM

## 2021-02-09 DIAGNOSIS — E8881 Metabolic syndrome: Secondary | ICD-10-CM

## 2021-02-12 NOTE — Telephone Encounter (Signed)
Labs ordered, sign if appropriate.

## 2021-02-12 NOTE — Telephone Encounter (Signed)
I have already ordered this as a future order in the computer.   Orders Placed This Encounter  Procedures   TSH   Lipid Panel w/reflex Direct LDL   COMPLETE METABOLIC PANEL WITH GFR   CBC with Differential/Platelet   Hemoglobin A1c

## 2021-02-19 ENCOUNTER — Telehealth: Payer: Self-pay | Admitting: Family Medicine

## 2021-02-19 ENCOUNTER — Other Ambulatory Visit: Payer: Self-pay | Admitting: Family Medicine

## 2021-02-19 ENCOUNTER — Encounter: Payer: Self-pay | Admitting: Family Medicine

## 2021-02-19 ENCOUNTER — Ambulatory Visit: Payer: No Typology Code available for payment source | Admitting: Family Medicine

## 2021-02-19 ENCOUNTER — Other Ambulatory Visit: Payer: Self-pay

## 2021-02-19 VITALS — BP 127/72 | HR 97 | Resp 18 | Ht 66.0 in | Wt 271.0 lb

## 2021-02-19 DIAGNOSIS — R635 Abnormal weight gain: Secondary | ICD-10-CM

## 2021-02-19 DIAGNOSIS — F902 Attention-deficit hyperactivity disorder, combined type: Secondary | ICD-10-CM

## 2021-02-19 DIAGNOSIS — I10 Essential (primary) hypertension: Secondary | ICD-10-CM

## 2021-02-19 DIAGNOSIS — Z1211 Encounter for screening for malignant neoplasm of colon: Secondary | ICD-10-CM

## 2021-02-19 MED ORDER — METHYLPHENIDATE HCL ER (OSM) 36 MG PO TBCR: 36.0000 mg | EXTENDED_RELEASE_TABLET | Freq: Every day | ORAL | 0 refills | Status: DC

## 2021-02-19 MED ORDER — PLENITY PO CAPS: 3.0000 | ORAL_CAPSULE | Freq: Two times a day (BID) | ORAL | 1 refills | Status: DC

## 2021-02-19 NOTE — Assessment & Plan Note (Signed)
We discussed options and she is having difficulty getting the branded version of Adderall.  We can certainly work on getting prior authorization for the brand she did try generic for 6 months and unfortunately it was just not nearly as effective and it would wear off very quickly.  In the short-term we could also try switching to Concerta to see if this might be an option as well she is never tried that 1 before. Meds ordered this encounter  Medications   Carboxymeth-Cellulose-CitricAc (PLENITY) CAPS    Sig: Take 3 capsules by mouth 2 (two) times daily.    Dispense:  540 capsule    Refill:  1   methylphenidate (CONCERTA) 36 MG PO CR tablet    Sig: Take 1 tablet (36 mg total) by mouth daily.    Dispense:  30 tablet    Refill:  0

## 2021-02-19 NOTE — Patient Instructions (Signed)
Preventive Care 21-44 Years Old, Female °Preventive care refers to lifestyle choices and visits with your health care provider that can promote health and wellness. Preventive care visits are also called wellness exams. °What can I expect for my preventive care visit? °Counseling °During your preventive care visit, your health care provider may ask about your: °Medical history, including: °Past medical problems. °Family medical history. °Pregnancy history. °Current health, including: °Menstrual cycle. °Method of birth control. °Emotional well-being. °Home life and relationship well-being. °Sexual activity and sexual health. °Lifestyle, including: °Alcohol, nicotine or tobacco, and drug use. °Access to firearms. °Diet, exercise, and sleep habits. °Work and work environment. °Sunscreen use. °Safety issues such as seatbelt and bike helmet use. °Physical exam °Your health care provider may check your: °Height and weight. These may be used to calculate your BMI (body mass index). BMI is a measurement that tells if you are at a healthy weight. °Waist circumference. This measures the distance around your waistline. This measurement also tells if you are at a healthy weight and may help predict your risk of certain diseases, such as type 2 diabetes and high blood pressure. °Heart rate and blood pressure. °Body temperature. °Skin for abnormal spots. °What immunizations do I need? °Vaccines are usually given at various ages, according to a schedule. Your health care provider will recommend vaccines for you based on your age, medical history, and lifestyle or other factors, such as travel or where you work. °What tests do I need? °Screening °Your health care provider may recommend screening tests for certain conditions. This may include: °Pelvic exam and Pap test. °Lipid and cholesterol levels. °Diabetes screening. This is done by checking your blood sugar (glucose) after you have not eaten for a while (fasting). °Hepatitis B  test. °Hepatitis C test. °HIV (human immunodeficiency virus) test. °STI (sexually transmitted infection) testing, if you are at risk. °BRCA-related cancer screening. This may be done if you have a family history of breast, ovarian, tubal, or peritoneal cancers. °Talk with your health care provider about your test results, treatment options, and if necessary, the need for more tests. °Follow these instructions at home: °Eating and drinking ° °Eat a healthy diet that includes fresh fruits and vegetables, whole grains, lean protein, and low-fat dairy products. °Take vitamin and mineral supplements as recommended by your health care provider. °Do not drink alcohol if: °Your health care provider tells you not to drink. °You are pregnant, may be pregnant, or are planning to become pregnant. °If you drink alcohol: °Limit how much you have to 0-1 drink a day. °Know how much alcohol is in your drink. In the [place] one drink equals one 12 oz bottle of beer (355 mL), one 5 oz glass of wine (148 mL), or one 1½ oz glass of hard liquor (44 mL). °Lifestyle °Brush your teeth every morning and night with fluoride toothpaste. Floss one time each day. °Exercise for at least 30 minutes 5 or more days each week. °Do not use any products that contain nicotine or tobacco. These products include cigarettes, chewing tobacco, and vaping devices, such as e-cigarettes. If you need help quitting, ask your health care provider. °Do not use drugs. °If you are sexually active, practice safe sex. Use a condom or other form of protection to prevent STIs. °If you do not wish to become pregnant, use a form of birth control. If you plan to become pregnant, see your health care provider for a prepregnancy visit. °Find healthy ways to manage stress, such as: °Meditation, yoga,   or listening to music. °Journaling. °Talking to a trusted person. °Spending time with friends and family. °Minimize exposure to UV radiation to reduce your risk of skin  cancer. °Safety °Always wear your seat belt while driving or riding in a vehicle. °Do not drive: °If you have been drinking alcohol. Do not ride with someone who has been drinking. °If you have been using any mind-altering substances or drugs. °While texting. °When you are tired or distracted. °Wear a helmet and other protective equipment during sports activities. °If you have firearms in your house, make sure you follow all gun safety procedures. °Seek help if you have been physically or sexually abused. °What's next? °Go to your health care provider once a year for an annual wellness visit. °Ask your health care provider how often you should have your eyes and teeth checked. °Stay up to date on all vaccines. °This information is not intended to replace advice given to you by your health care provider. Make sure you discuss any questions you have with your health care provider. °Document Revised: 07/05/2020 Document Reviewed: 07/05/2020 °Elsevier Patient Education © 2022 Elsevier Inc. ° °

## 2021-02-19 NOTE — Progress Notes (Signed)
Subjective:     [name] is a 46 y.o. female and is here for a comprehensive physical exam. The patient reports problems - increased stress recently . 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  She is just been feeling overwhelmed with doing so much including school and raising her kids and working.  And financially they are able to afford it.  She is hoping that that might decompress and provide some stress relief.  She has been walking to stay active.  [facility].  Social History   Socioeconomic History   Marital status: Married    Spouse name: [name]   Number of children: 7   Years of education: college   Highest education level: Not on file  Occupational History   Occupation: Runner, broadcasting/film/video   Occupation: In school for IT  Tobacco Use   Smoking status: Former    Types: Cigarettes    Quit date: 01/27/2004    Years since quitting: 17.0   Smokeless tobacco: Never  Vaping Use   Vaping Use: Never used  Substance and Sexual Activity   Alcohol use: Yes    Alcohol/week: 1.0 standard drink    Types: 1 Glasses of wine per week    Comment: 1-2 drinks a week of wine   Drug use: No   Sexual activity: Yes    Partners: Male    Birth control/protection: Other-see comments    Comment: vasectomy  Other Topics Concern   Not on file  Social History Narrative   No exercise. 2 cups of coffee a day    Social Determinants of Health   Financial Resource Strain: Not on file  Food Insecurity: Not on file  Transportation Needs: Not on file  Physical Activity: Not on file  Stress: Not on file  Social Connections: Not on file  Intimate Partner Violence: Not on file   Health Maintenance  Topic Date Due   Hepatitis C Screening  Never done   COLONOSCOPY (Pts 45-35yrs Insurance coverage will need to be confirmed)  02/04/2021   COVID-19 Vaccine (3 - Booster) 03/07/2021 (Originally 05/18/2019)   PAP SMEAR-Modifier  12/29/2024   TETANUS/TDAP   10/04/2026   INFLUENZA VACCINE  Completed   HIV Screening  Completed   HPV VACCINES  Aged Out    The following portions of the patient's history were reviewed and updated as appropriate: allergies, current medications, past family history, past medical history, past social history, past surgical history, and problem list.  Review of Systems Pertinent items are noted in HPI.   Objective:    BP 127/72    Pulse 97    Resp 18    Ht 5\' 6"  (1.676 m)    Wt 271 lb (122.9 kg)    LMP 02/13/2021    SpO2 100%    BMI 43.74 kg/m  General appearance: alert, cooperative, and appears stated age Head: Normocephalic, without obvious abnormality, atraumatic Eyes:  conj clear, EOMi, PEERLA Ears: normal TM's and external ear canals both ears Nose: Nares normal. Septum midline. Mucosa normal. No drainage or sinus tenderness. Throat: lips, mucosa, and tongue normal; teeth and gums normal Neck: no adenopathy, no carotid bruit, no JVD, supple, symmetrical, trachea midline, and thyroid not enlarged, symmetric, no tenderness/mass/nodules Back: symmetric, no curvature. ROM normal. No CVA tenderness. Lungs: clear to auscultation bilaterally Breasts: normal appearance, no masses or tenderness Heart: regular rate and rhythm, S1, S2 normal, no  murmur, click, rub or gallop Abdomen: soft, non-tender; bowel sounds normal; no masses,  no organomegaly Extremities: extremities normal, atraumatic, no cyanosis or edema Pulses: 2+ and symmetric Skin: Skin color, texture, turgor normal. No rashes or lesions Lymph nodes: Cervical, supraclavicular, and axillary nodes normal. Neurologic: Grossly normal    Assessment:    Healthy female exam.      Plan:     See After Visit Summary for Counseling Recommendations  Keep up a regular exercise program and make sure you are eating a healthy diet Try to eat 4 servings of dairy a day, or if you are lactose intolerant take a calcium with vitamin D daily.  Your vaccines are up to  date.  GI furl placed for screening colonoscopy.  Is due for 5-year recall.   Essential hypertension Pressure looks great today.  Continue current regimen for now.  Attention deficit hyperactivity disorder (ADHD), combined type We discussed options and she is having difficulty getting the branded version of Adderall.  We can certainly work on getting prior authorization for the brand she did try generic for 6 months and unfortunately it was just not nearly as effective and it would wear off very quickly.  In the short-term we could also try switching to Concerta to see if this might be an option as well she is never tried that 1 before. Meds ordered this encounter  Medications   Carboxymeth-Cellulose-CitricAc (PLENITY) CAPS    Sig: Take 3 capsules by mouth 2 (two) times daily.    Dispense:  540 capsule    Refill:  1   methylphenidate (CONCERTA) 36 MG PO CR tablet    Sig: Take 1 tablet (36 mg total) by mouth daily.    Dispense:  30 tablet    Refill:  0    Meds ordered this encounter  Medications   Carboxymeth-Cellulose-CitricAc (PLENITY) CAPS    Sig: Take 3 capsules by mouth 2 (two) times daily.    Dispense:  540 capsule    Refill:  1   methylphenidate (CONCERTA) 36 MG PO CR tablet    Sig: Take 1 tablet (36 mg total) by mouth daily.    Dispense:  30 tablet    Refill:  0   Orders Placed This Encounter  Procedures   Ambulatory referral to Gastroenterology    Referral Priority:   Routine    Referral Type:   Consultation    Referral Reason:   Specialty Services Required    Number of Visits Requested:   1

## 2021-02-19 NOTE — Telephone Encounter (Signed)
Please see if we can initiate a prior authorization for branded Adderall XR.  Patient did try the generic for 6 months and it was not as effective it was wearing off much more quickly.

## 2021-02-19 NOTE — Assessment & Plan Note (Signed)
Pressure looks great today.  Continue current regimen for now.

## 2021-02-20 LAB — COMPLETE METABOLIC PANEL WITH GFR
AG Ratio: 1.5 (calc) (ref 1.0–2.5)
ALT: 12 U/L (ref 6–29)
AST: 11 U/L (ref 10–30)
Albumin: 4.1 g/dL (ref 3.6–5.1)
Alkaline phosphatase (APISO): 56 U/L (ref 31–125)
BUN: 13 mg/dL (ref 7–25)
CO2: 29 mmol/L (ref 20–32)
Calcium: 9 mg/dL (ref 8.6–10.2)
Chloride: 105 mmol/L (ref 98–110)
Creat: 0.76 mg/dL (ref 0.50–0.99)
Globulin: 2.8 g/dL (calc) (ref 1.9–3.7)
Glucose, Bld: 82 mg/dL (ref 65–99)
Potassium: 4.3 mmol/L (ref 3.5–5.3)
Sodium: 140 mmol/L (ref 135–146)
Total Bilirubin: 0.5 mg/dL (ref 0.2–1.2)
Total Protein: 6.9 g/dL (ref 6.1–8.1)
eGFR: 100 mL/min/{1.73_m2} (ref >=60)

## 2021-02-20 LAB — CBC WITH DIFFERENTIAL/PLATELET
Absolute Monocytes: 600 cells/uL (ref 200–950)
Basophils Absolute: 70 cells/uL (ref 0–200)
Basophils Relative: 0.8 %
Eosinophils Absolute: 174 cells/uL (ref 15–500)
Eosinophils Relative: 2 %
HCT: 37.6 % (ref 35.0–45.0)
Hemoglobin: 12 g/dL (ref 11.7–15.5)
Lymphs Abs: 3323 cells/uL (ref 850–3900)
MCH: 27.1 pg (ref 27.0–33.0)
MCHC: 31.9 g/dL — ABNORMAL LOW (ref 32.0–36.0)
MCV: 84.9 fL (ref 80.0–100.0)
MPV: 9.4 fL (ref 7.5–12.5)
Monocytes Relative: 6.9 %
Neutro Abs: 4533 cells/uL (ref 1500–7800)
Neutrophils Relative %: 52.1 %
Platelets: 457 10*3/uL — ABNORMAL HIGH (ref 140–400)
RBC: 4.43 10*6/uL (ref 3.80–5.10)
RDW: 12.7 % (ref 11.0–15.0)
Total Lymphocyte: 38.2 %
WBC: 8.7 10*3/uL (ref 3.8–10.8)

## 2021-02-20 LAB — HEMOGLOBIN A1C
Hgb A1c MFr Bld: 5.2 % of total Hgb (ref <5.7)
Mean Plasma Glucose: 103 mg/dL
eAG (mmol/L): 5.7 mmol/L

## 2021-02-20 LAB — LIPID PANEL W/REFLEX DIRECT LDL
Cholesterol: 165 mg/dL (ref <200)
HDL: 47 mg/dL — ABNORMAL LOW (ref >=50)
LDL Cholesterol (Calc): 92 mg/dL (calc)
Non-HDL Cholesterol (Calc): 118 mg/dL (calc) (ref <130)
Total CHOL/HDL Ratio: 3.5 (calc) (ref <5.0)
Triglycerides: 161 mg/dL — ABNORMAL HIGH (ref <150)

## 2021-02-20 LAB — TSH: TSH: 1.67 mIU/L

## 2021-02-20 NOTE — Progress Notes (Signed)
HI [name],   Your LDL and total cholesterol is good.  Your triglycerides are up a little. Your thyroid and blood count are is OK. Your metabolic panel is OK.  Your A1C is good. No diabetes or prediabetes

## 2021-02-22 ENCOUNTER — Encounter: Payer: Self-pay | Admitting: Family Medicine

## 2021-02-23 NOTE — Telephone Encounter (Signed)
Please initiate the prior auth for brand only. Thanks in advance.

## 2021-02-26 ENCOUNTER — Encounter: Payer: Self-pay | Admitting: Family Medicine

## 2021-02-26 DIAGNOSIS — F902 Attention-deficit hyperactivity disorder, combined type: Secondary | ICD-10-CM

## 2021-02-26 MED ORDER — METHYLPHENIDATE HCL ER (OSM) 54 MG PO TBCR: 54.0000 mg | EXTENDED_RELEASE_TABLET | ORAL | 0 refills | Status: DC

## 2021-03-01 ENCOUNTER — Telehealth: Payer: Self-pay

## 2021-03-01 NOTE — Telephone Encounter (Addendum)
Initiated Prior authorization BJS:[id number] XR 25mg  Via: Covermymeds Case/Key: Status: denied  as of 03/01/21 Reason:Why your request was denied: You do not meet the requirements of your plan. Your plan covers this drug when you had an adverse reaction to the generic drug due to an inactive ingredient. Your request has been denied based on the information we have. Formulary alternative(s) are amphetamine-dextroamphetamine, amphetamine-dextroamphetamine extended-release, atomoxetine, dexmethylphenidate, dexmethylphenidate extended-release capsule, methylphenidate extended-release capsule, dextroamphetamine, dextroamphetamine extendedrelease, guanfacine extended-release, methylphenidate, methylphenidate extended-release osmotic tablet (generic Concerta), methylphenidate extended-release capsule. Requirement: 3 in a class with 3 or more alternatives, 2 in a class with 2 alternatives, or 1 in a class with only 1 alternative. Please refer to your plan documents for a complete list of alternatives.  Notified Pt via: Mychart

## 2021-03-27 MED ORDER — METHYLPHENIDATE HCL ER (OSM) 18 MG PO TBCR: 18.0000 mg | EXTENDED_RELEASE_TABLET | Freq: Every day | ORAL | 0 refills | Status: DC

## 2021-03-27 MED ORDER — METHYLPHENIDATE HCL ER (OSM) 54 MG PO TBCR: 54.0000 mg | EXTENDED_RELEASE_TABLET | ORAL | 0 refills | Status: DC

## 2021-03-27 NOTE — Addendum Note (Signed)
Addended by: Beatrice Lecher D on: 03/27/2021 09:13 AM ? ? Modules accepted: Orders ? ?

## 2021-03-27 NOTE — Telephone Encounter (Signed)
Meds ordered this encounter  ?Medications  ? DISCONTD: methylphenidate (CONCERTA) 54 MG PO CR tablet  ?  Sig: Take 1 tablet (54 mg total) by mouth every morning.  ?  Dispense:  30 tablet  ?  Refill:  0  ? methylphenidate (CONCERTA) 54 MG PO CR tablet  ?  Sig: Take 1 tablet (54 mg total) by mouth every morning.  ?  Dispense:  30 tablet  ?  Refill:  0  ? methylphenidate (CONCERTA) 18 MG PO CR tablet  ?  Sig: Take 1 tablet (18 mg total) by mouth daily.  ?  Dispense:  30 tablet  ?  Refill:  0  ? ? ?

## 2021-04-29 ENCOUNTER — Other Ambulatory Visit: Payer: Self-pay | Admitting: Family Medicine

## 2021-04-29 DIAGNOSIS — F902 Attention-deficit hyperactivity disorder, combined type: Secondary | ICD-10-CM

## 2021-04-30 MED ORDER — METHYLPHENIDATE HCL ER (OSM) 18 MG PO TBCR: 18.0000 mg | EXTENDED_RELEASE_TABLET | Freq: Every day | ORAL | 0 refills | Status: DC

## 2021-04-30 MED ORDER — METHYLPHENIDATE HCL ER (OSM) 54 MG PO TBCR: 54.0000 mg | EXTENDED_RELEASE_TABLET | ORAL | 0 refills | Status: DC

## 2021-05-01 ENCOUNTER — Encounter: Payer: Self-pay | Admitting: Family Medicine

## 2021-05-02 ENCOUNTER — Other Ambulatory Visit (HOSPITAL_COMMUNITY): Payer: Self-pay

## 2021-05-02 MED ORDER — METHYLPHENIDATE HCL ER (OSM) 36 MG PO TBCR
72.0000 mg | EXTENDED_RELEASE_TABLET | Freq: Every day | ORAL | 0 refills | Status: DC
  Filled 2021-05-02: qty 180, 90d supply, fill #0
  Filled 2021-05-02: qty 60, 30d supply, fill #0

## 2021-05-02 MED ORDER — DEXMETHYLPHENIDATE HCL ER 15 MG PO CP24
15.0000 mg | ORAL_CAPSULE | Freq: Every day | ORAL | 0 refills | Status: DC
  Filled 2021-05-02: qty 30, 30d supply, fill #0

## 2021-05-02 NOTE — Telephone Encounter (Signed)
OK, sent both incase they don't have one ? ?Meds ordered this encounter  ?Medications  ? dexmethylphenidate (FOCALIN XR) 15 MG 24 hr capsule  ?  Sig: Take 1 capsule (15 mg total) by mouth daily.  ?  Dispense:  90 capsule  ?  Refill:  0  ?  Please fill if the concerta is not available.  ? methylphenidate (CONCERTA) 36 MG PO CR tablet  ?  Sig: Take 2 tablets (72 mg total) by mouth daily.  ?  Dispense:  180 tablet  ?  Refill:  0  ? ? ?

## 2021-05-05 ENCOUNTER — Other Ambulatory Visit: Payer: Self-pay | Admitting: Family Medicine

## 2021-05-15 ENCOUNTER — Encounter: Payer: Self-pay | Admitting: Family Medicine

## 2021-05-28 ENCOUNTER — Encounter: Payer: Self-pay | Admitting: Internal Medicine

## 2021-05-29 ENCOUNTER — Telehealth (INDEPENDENT_AMBULATORY_CARE_PROVIDER_SITE_OTHER): Payer: No Typology Code available for payment source | Admitting: Family Medicine

## 2021-05-29 DIAGNOSIS — F902 Attention-deficit hyperactivity disorder, combined type: Secondary | ICD-10-CM

## 2021-05-29 DIAGNOSIS — Z7689 Persons encountering health services in other specified circumstances: Secondary | ICD-10-CM | POA: Diagnosis not present

## 2021-05-29 MED ORDER — DEXMETHYLPHENIDATE HCL ER 15 MG PO CP24: 15.0000 mg | ORAL_CAPSULE | Freq: Every day | ORAL | 0 refills | Status: DC

## 2021-05-29 MED ORDER — PLENITY PO CAPS: 3.0000 | ORAL_CAPSULE | Freq: Two times a day (BID) | ORAL | 3 refills | Status: DC

## 2021-05-29 MED ORDER — BUPROPION HCL ER (XL) 150 MG PO TB24: 150.0000 mg | ORAL_TABLET | Freq: Every day | ORAL | 0 refills | Status: DC

## 2021-05-29 NOTE — Assessment & Plan Note (Addendum)
Visit #: 1 ?Starting Weight: 285 lbs.   ? ?Current weight: 285 lbs.  ?Previous weight: 271 lbs.  ?Change in weight: ?Goal weight: ?Dietary goals: ?Exercise goals: ?Medication: will start plenity , can start with 1 cap BID.  Advance as tolerated.   ?Sent to [facility].  ?Follow-up and referrals: ? ?

## 2021-05-29 NOTE — Assessment & Plan Note (Signed)
Doing well so far on Focalin so far but some increased irritability. OK to add Wellbutrin for mood and focus. F/U in 2-3 months.   ?

## 2021-05-29 NOTE — Progress Notes (Signed)
Pt reports that the Rx for the Plenity was written incorrectly and wasn't ever filled.  ? ?This medication causes her to be more irritated in the afternoon.  ?

## 2021-05-29 NOTE — Progress Notes (Signed)
? ? ?Virtual Visit via Video Note ? ?I connected with [name] on 05/29/21 at  1:00 PM EDT by a video enabled telemedicine application and verified that I am speaking with the correct person using two identifiers. ?  ?I discussed the limitations of evaluation and management by telemedicine and the availability of in person appointments. The patient expressed understanding and agreed to proceed. ? ?Patient location: at home ?Provider location: in office ? ?Subjective:   ? ?CC:   ?Chief Complaint  ?Patient presents with  ? ADHD  ? ? ?HPI: ?ADHD - Reports symptoms are well controlled on current regime. Denies any problems with insomnia, chest pain, palpitations, or SOB.  Says the Focalin makes her more irritable but overall feels it is working.  She wonders if something like Wellbutrin could be helpful for mood.  She graduates tomorrow. 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  ? ?F/U Weight management - has gained some weight since changing around her ADD medications. The plenity wasn't covered so needs to be resent as 30 days.  We sent 90 but they couldn't process the scrip. She is still interested ? ?Past medical history, Surgical history, Family history not pertinant except as noted below, Social history, Allergies, and medications have been entered into the medical record, reviewed, and corrections made.  ? ? ?Objective:   ? ?General: Speaking clearly in complete sentences without any shortness of breath.  Alert and oriented x3.  Normal judgment. No apparent acute distress. ? ? ? ?Impression and Recommendations:   ? ?Problem List Items Addressed This Visit   ? ?  ? Other  ? Encounter for weight management  ?  Visit #: 1 ?Starting Weight: 285 lbs.   ? ?Current weight: 285 lbs.  ?Previous weight: 271 lbs.  ?Change in weight: ?Goal weight: ?Dietary goals: ?Exercise goals: ?Medication: will start plenity , can start with 1 cap BID.  Advance as tolerated.   ?Sent to  [facility].  ?Follow-up and referrals: ? ?  ?  ? Relevant Medications  ? Carboxymeth-Cellulose-CitricAc (PLENITY) CAPS  ? Attention deficit hyperactivity disorder (ADHD), combined type - Primary  ?  Doing well so far on Focalin so far but some increased irritability. OK to add Wellbutrin for mood and focus. F/U in 2-3 months.   ? ?  ?  ? ? ?No orders of the defined types were placed in this encounter. ? ? ?Meds ordered this encounter  ?Medications  ? DISCONTD: Carboxymeth-Cellulose-CitricAc (PLENITY) CAPS  ?  Sig: Take 3 capsules by mouth 2 (two) times daily. Take with 16 oz of water about 20 min before AM and PM meals  ?  Dispense:  180 capsule  ?  Refill:  3  ? dexmethylphenidate (FOCALIN XR) 15 MG 24 hr capsule  ?  Sig: Take 1 capsule (15 mg total) by mouth daily.  ?  Dispense:  90 capsule  ?  Refill:  0  ?  Please fill if the concerta is not available.  ? buPROPion (WELLBUTRIN XL) 150 MG 24 hr tablet  ?  Sig: Take 1 tablet (150 mg total) by mouth daily.  ?  Dispense:  90 tablet  ?  Refill:  0  ? Carboxymeth-Cellulose-CitricAc (PLENITY) CAPS  ?  Sig: Take 3 capsules by mouth 2 (two) times daily. Take with 16 oz of water about 20 min before AM and PM meals  ?  Dispense:  180 capsule  ?  Refill:  3  ? ? ? ?I discussed the assessment and treatment plan with the patient. The patient was provided an opportunity to ask questions and all were answered. The patient agreed with the plan and demonstrated an understanding of the instructions. ?  ?The patient was advised to call back or seek an in-person evaluation if the symptoms worsen or if the condition fails to improve as anticipated. ? ? ?Nani Gasser, MD  ? ?

## 2021-07-08 ENCOUNTER — Other Ambulatory Visit: Payer: Self-pay | Admitting: Family Medicine

## 2021-07-26 ENCOUNTER — Emergency Department
Admission: RE | Admit: 2021-07-26 | Discharge: 2021-07-26 | Disposition: A | Discharge status: Home / Self Care | Payer: No Typology Code available for payment source | Source: Ambulatory Visit

## 2021-07-26 VITALS — BP 136/92 | HR 80 | Temp 98.9°F | Resp 18 | Ht 65.0 in | Wt 285.0 lb

## 2021-07-26 DIAGNOSIS — K122 Cellulitis and abscess of mouth: Secondary | ICD-10-CM

## 2021-07-26 DIAGNOSIS — J029 Acute pharyngitis, unspecified: Secondary | ICD-10-CM | POA: Diagnosis not present

## 2021-07-26 LAB — POCT RAPID STREP A (OFFICE): Rapid Strep A Screen: NEGATIVE

## 2021-07-26 MED ORDER — PREDNISONE 20 MG PO TABS: ORAL_TABLET | ORAL | 0 refills | Status: DC

## 2021-07-26 MED ORDER — AMOXICILLIN-POT CLAVULANATE 875-125 MG PO TABS: 1.0000 | ORAL_TABLET | Freq: Two times a day (BID) | ORAL | 0 refills | Status: AC

## 2021-07-26 NOTE — Discharge Instructions (Addendum)
Instructed patient to take medication as directed with food to completion.  Advised patient to take prednisone with first dose of Augmentin for the next 5 of 7 days.  Encouraged patient to increase daily water intake while taking these medications.  Advised patient if symptoms worsen and/or unresolved please follow-up with PCP or here for further evaluation.

## 2021-07-26 NOTE — ED Provider Notes (Signed)
[facility]    CSN: [id number] Arrival date & time: 07/26/21  0950      History   Chief Complaint Chief Complaint  Patient presents with   Sore Throat    Entered by patient   Appointment    HPI [name] is a 46 y.o. female.   HPI 46 year old female presents with sore throat and headache for 3 days, reports difficulty with swallowing.  PMH significant for morbid obesity, insulin resistance, and menstrual migraine without status migrainosus, not intractable.  Past Medical History:  Diagnosis Date   ACL (anterior cruciate ligament) tear    MCL. 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)    Pinched nerve    neck/shoulder   Plantar fasciitis    Post partum depression     Patient Active Problem List   Diagnosis Date Noted   Encounter for weight management 05/29/2021   Essential hypertension 11/23/2020   Family history of breast cancer 04/28/2020   Pes anserine bursitis 10/21/2019   Internal hordeolum of right eye 04/02/2019   Menstrual migraine without status migrainosus, not intractable 03/08/2019   Abnormal weight gain 08/14/2018   Stress at home 08/13/2018   Adjustment insomnia 08/13/2018   BMI 40.0-44.9, adult (HCC) 08/13/2018   Heart murmur 03/05/2018   Insulin resistance 02/04/2018   Class 3 severe obesity with serious comorbidity and body mass index (BMI) of 40.0 to 44.9 in adult [facility]) 02/04/2018   Psychological factors affecting morbid obesity (HCC) 07/08/2017   Arthritis 06/18/2017   Metatarsalgia, right foot 06/18/2017   Family history of colon cancer 12/12/2015    Family history of congenital heart defect 07/17/2012   Ureteral calculus, left 04/12/2011   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 Authorizing Provider  amoxicillin-clavulanate (AUGMENTIN) 875-125 MG tablet Take 1 tablet by mouth 2 (two) times daily for 7 days. 07/26/21 08/02/21 Yes Trevor Iha, FNP  buPROPion (WELLBUTRIN XL) 150 MG 24 hr tablet Take 1 tablet (150 mg total) by mouth daily. 05/29/21  Yes Agapito Games, MD  Carboxymeth-Cellulose-CitricAc (PLENITY) CAPS Take 3 capsules by  mouth 2 (two) times daily. Take with 16 oz of water about 20 min before AM and PM meals 05/29/21  Yes Agapito Games, MD  cholecalciferol (VITAMIN D3) 25 MCG (1000 UNIT) tablet Take 1,000 Units by mouth daily.   Yes [provider]  dexmethylphenidate (FOCALIN XR) 15 MG 24 hr capsule Take 1 capsule (15 mg total) by mouth daily. 05/29/21  Yes Agapito Games, MD  eletriptan (RELPAX) 20 MG tablet TAKE 1-2 TABLETS (20-40 MG TOTAL) BY MOUTH AS NEEDED FOR MIGRAINE OR HEADACHE. MAY REPEAT IN 2 HOURS IF HEADACHE PERSISTS OR RECURS. 07/09/21  Yes Agapito Games, MD  fluticasone (FLONASE) 50 MCG/ACT nasal spray Place 1 spray into both nostrils daily as needed for allergies or rhinitis.   Yes [provider]  lisinopril (ZESTRIL) 10 MG tablet Take 1 tablet (10 mg total) by mouth daily.  11/23/20  Yes Agapito Games, MD  Multiple Vitamin (MULTIVITAMIN) tablet Take 1 tablet by mouth daily.   Yes [provider]  predniSONE (DELTASONE) 20 MG tablet Take 3 tabs PO daily x 5 days. 07/26/21  Yes Trevor Iha, FNP  methylphenidate (CONCERTA) 36 MG PO CR tablet Take 2 tablets (72 mg total) by mouth daily. 05/02/21 05/02/21  Agapito Games, MD    Family History Family History  Adopted: Yes  Problem Relation Age of Onset   Colon cancer Mother 15       died at age 67   Colon polyps Mother    Lung cancer Father 62       smoker   Chiari malformation Sister    Colon cancer Maternal Grandmother 62   Heart attack Maternal Grandfather    Hypertension Maternal Grandfather    Diabetes Paternal Grandmother        type 1   Autism spectrum disorder Daughter    Other Daughter        Pancreatic insufficiency.    Breast cancer Maternal Aunt 20   Ovarian cancer Maternal Aunt    Other Maternal Aunt        reproductive issues   Colon cancer Maternal Uncle    Colon cancer Other 52       mat great grand mother    Social History Social History   Tobacco Use   Smoking status: Former    Types: Cigarettes    Quit date: 01/27/2004    Years since quitting: 17.5   Smokeless tobacco: Never  Vaping Use   Vaping Use: Never used  Substance Use Topics   Alcohol use: Yes    Alcohol/week: 1.0 standard drink of alcohol    Types: 1 Glasses of wine per week    Comment: 1-2 drinks a week of wine   Drug use: No     Allergies   Dairy aid [tilactase], Nickel, Phentermine, and Topamax [topiramate]   Review of Systems Review of Systems  HENT:  Positive for sore throat.   Neurological:  Positive for headaches.  All other systems reviewed and are negative.    Physical Exam Triage Vital Signs ED Triage Vitals  Enc Vitals Group     BP 07/26/21 1004 (!) 136/92     Pulse Rate 07/26/21 1004 80     Resp 07/26/21 1004 18     Temp 07/26/21 1004 98.9 F (37.2 C)     Temp  Source 07/26/21 1004 Oral     SpO2 07/26/21 1004 97 %     Weight 07/26/21 1006 285 lb (129.3 kg)  Height 07/26/21 1006 5\' 5"  (1.651 m)     Head Circumference --      Peak Flow --      Pain Score 07/26/21 1006 6     Pain Loc --      Pain Edu? --      Excl. in GC? --    No data found.  Updated Vital Signs BP (!) 136/92 (BP Location: Right Arm)   Pulse 80   Temp 98.9 F (37.2 C) (Oral)   Resp 18   Ht 5\' 5"  (1.651 m)   Wt 285 lb (129.3 kg)   LMP 07/04/2021   SpO2 97%   BMI 47.43 kg/m      Physical Exam Vitals and nursing note reviewed.  Constitutional:      General: She is not in acute distress.    Appearance: She is obese. She is not ill-appearing.  HENT:     Head: Normocephalic and atraumatic.     Right Ear: Tympanic membrane, ear canal and external ear normal.     Left Ear: Tympanic membrane, ear canal and external ear normal.     Mouth/Throat:     Mouth: Mucous membranes are moist.     Pharynx: Oropharynx is clear. Uvula midline. Posterior oropharyngeal erythema and uvula swelling present.  Eyes:     Conjunctiva/sclera: Conjunctivae normal.     Pupils: Pupils are equal, round, and reactive to light.  Cardiovascular:     Rate and Rhythm: Normal rate and regular rhythm.     Heart sounds: Murmur heard.  Pulmonary:     Effort: Pulmonary effort is normal.     Breath sounds: Normal breath sounds. No wheezing, rhonchi or rales.  Musculoskeletal:     Cervical back: Normal range of motion and neck supple.  Skin:    General: Skin is warm and dry.  Neurological:     General: No focal deficit present.     Mental Status: She is alert and oriented to person, place, and time.      UC Treatments / Results  Labs (all labs ordered are listed, but only abnormal results are displayed) Labs Reviewed  CULTURE, GROUP A STREP  POCT RAPID STREP A (OFFICE)    EKG   Radiology No results found.  Procedures Procedures (including critical care time)  Medications  Ordered in UC Medications - No data to display  Initial Impression / Assessment and Plan / UC Course  I have reviewed the triage vital signs and the nursing notes.  Pertinent labs & imaging results that were available during my care of the patient were reviewed by me and considered in my medical decision making (see chart for details).     MDM: 1.  Uvulitis-Rx'd Augmentin, prednisone. Instructed patient to take medication as directed with food to completion.  Advised patient to take prednisone with first dose of Augmentin for the next 5 of 7 days.  Encouraged patient to increase daily water intake while taking these medications.  Advised patient if symptoms worsen and/or unresolved please follow-up with PCP or here for further evaluation.  Discharged home, hemodynamically stable. Final Clinical Impressions(s) / UC Diagnoses   Final diagnoses:  Sore throat  Uvulitis     Discharge Instructions      Instructed patient to take medication as directed with food to completion.  Advised patient to take prednisone with first dose of Augmentin for the next 5 of 7 days.  Encouraged patient to increase daily water intake while taking these medications.  Advised patient if symptoms worsen and/or unresolved please follow-up with PCP or here for further evaluation.     ED Prescriptions     Medication Sig Dispense Auth. Provider   amoxicillin-clavulanate (AUGMENTIN) 875-125 MG tablet Take 1 tablet by mouth 2 (two) times daily for 7 days. 14 tablet Trevor Iha, FNP   predniSONE (DELTASONE) 20 MG tablet Take 3 tabs PO daily x 5 days. 15 tablet Trevor Iha, FNP      PDMP not reviewed this encounter.   Trevor Iha, FNP 07/26/21 [phone]

## 2021-07-26 NOTE — ED Triage Notes (Signed)
Patient c/o severe sore throat x 3 days, taken Tylenol and Ibuprofen.  Patient c/o headache as well.  Difficulty swallowing.

## 2021-07-29 LAB — CULTURE, GROUP A STREP: Strep A Culture: NEGATIVE

## 2021-08-06 ENCOUNTER — Encounter: Payer: Self-pay | Admitting: Family Medicine

## 2021-08-06 ENCOUNTER — Telehealth (INDEPENDENT_AMBULATORY_CARE_PROVIDER_SITE_OTHER): Payer: No Typology Code available for payment source | Admitting: Family Medicine

## 2021-08-06 DIAGNOSIS — F902 Attention-deficit hyperactivity disorder, combined type: Secondary | ICD-10-CM | POA: Diagnosis not present

## 2021-08-06 MED ORDER — BUPROPION HCL ER (XL) 300 MG PO TB24: 300.0000 mg | ORAL_TABLET | Freq: Every day | ORAL | 0 refills | Status: DC

## 2021-08-06 MED ORDER — DEXMETHYLPHENIDATE HCL ER 10 MG PO CP24: 10.0000 mg | ORAL_CAPSULE | Freq: Every day | ORAL | 0 refills | Status: DC

## 2021-08-06 MED ORDER — METHYLPHENIDATE HCL ER (OSM) 54 MG PO TBCR
54.0000 mg | EXTENDED_RELEASE_TABLET | Freq: Every day | ORAL | 0 refills | Status: DC
Start: 2021-08-06 — End: 2021-09-11

## 2021-08-06 NOTE — Progress Notes (Signed)
    Virtual Visit via Video Note  I connected with [name] on 08/06/21 at 11:30 AM EDT by a video enabled telemedicine application and verified that I am speaking with the correct person using two identifiers.   I discussed the limitations of evaluation and management by telemedicine and the availability of in person appointments. The patient expressed understanding and agreed to proceed.  Patient location: at home Provider location: in office  Subjective:    CC:   Chief Complaint  Patient presents with   ADHD    HPI:  Would prefer to be on the Concerta over the Focalin, but hasn't been able to ger it. Taking the welbutrin in the AM. It has really helped. Even noticed improvement in mood.    Going to the gym 3 days per week. Delays her Focalin dose after her workout bc of inc heart rate.  Has been trying to increase water intake and get more vegetables and fruits then.  Would like to go up on Wellbutrin and dec the Focalin.    BP was high when went to UC recently.  Worried the [place] may be increasing her BP.    Past medical history, Surgical history, Family history not pertinant except as noted below, Social history, Allergies, and medications have been entered into the medical record, reviewed, and corrections made.    Objective:    General: Speaking clearly in complete sentences without any shortness of breath.  Alert and oriented x3.  Normal judgment. No apparent acute distress.    Impression and Recommendations:    Problem List Items Addressed This Visit       Other   Attention deficit hyperactivity disorder (ADHD), combined type    Will inc Welbutrin to 300 mg.  And will decrease the Focalin to 10 mg.  She would still prefer the Concerta so working to see if the pharmacy can get the 54 mg in stock and if not then they can fill the Focalin instead.  Plan to follow-up in about 3 months.       No orders of the defined types were placed in this  encounter.   Meds ordered this encounter  Medications   dexmethylphenidate (FOCALIN XR) 10 MG 24 hr capsule    Sig: Take 1 capsule (10 mg total) by mouth daily.    Dispense:  30 capsule    Refill:  0    Please fill if the concerta is not available.   methylphenidate (CONCERTA) 54 MG PO CR tablet    Sig: Take 1 tablet (54 mg total) by mouth daily.    Dispense:  30 tablet    Refill:  0   buPROPion (WELLBUTRIN XL) 300 MG 24 hr tablet    Sig: Take 1 tablet (300 mg total) by mouth daily.    Dispense:  90 tablet    Refill:  0     I discussed the assessment and treatment plan with the patient. The patient was provided an opportunity to ask questions and all were answered. The patient agreed with the plan and demonstrated an understanding of the instructions.   The patient was advised to call back or seek an in-person evaluation if the symptoms worsen or if the condition fails to improve as anticipated.   Nani Gasser, MD

## 2021-08-06 NOTE — Assessment & Plan Note (Signed)
Will inc Welbutrin to 300 mg.  And will decrease the Focalin to 10 mg.  She would still prefer the Concerta so working to see if the pharmacy can get the 54 mg in stock and if not then they can fill the Focalin instead.  Plan to follow-up in about 3 months.

## 2021-08-06 NOTE — Progress Notes (Signed)
Pt reports that she feels that the Wellbutrin has really helped her.   Some days she doesn't take the Focalin until after lunch because she doesn't want to increase her heart rate.

## 2021-08-09 ENCOUNTER — Telehealth: Payer: No Typology Code available for payment source | Admitting: Family Medicine

## 2021-08-24 ENCOUNTER — Encounter: Payer: Self-pay | Admitting: Family Medicine

## 2021-08-24 MED ORDER — VALACYCLOVIR HCL 1 G PO TABS: 2000.0000 mg | ORAL_TABLET | Freq: Two times a day (BID) | ORAL | 0 refills | Status: AC

## 2021-08-26 ENCOUNTER — Other Ambulatory Visit: Payer: Self-pay | Admitting: Family Medicine

## 2021-08-29 ENCOUNTER — Other Ambulatory Visit: Payer: Self-pay | Admitting: Family Medicine

## 2021-08-29 ENCOUNTER — Encounter: Payer: Self-pay | Admitting: Internal Medicine

## 2021-08-29 DIAGNOSIS — Z1231 Encounter for screening mammogram for malignant neoplasm of breast: Secondary | ICD-10-CM

## 2021-08-30 ENCOUNTER — Ambulatory Visit (INDEPENDENT_AMBULATORY_CARE_PROVIDER_SITE_OTHER): Payer: No Typology Code available for payment source

## 2021-08-30 DIAGNOSIS — Z1231 Encounter for screening mammogram for malignant neoplasm of breast: Secondary | ICD-10-CM | POA: Diagnosis not present

## 2021-08-31 NOTE — Progress Notes (Signed)
Please call patient. Normal mammogram.  Repeat in 1 year.  

## 2021-09-11 ENCOUNTER — Telehealth (INDEPENDENT_AMBULATORY_CARE_PROVIDER_SITE_OTHER): Payer: No Typology Code available for payment source | Admitting: Family Medicine

## 2021-09-11 DIAGNOSIS — I10 Essential (primary) hypertension: Secondary | ICD-10-CM | POA: Diagnosis not present

## 2021-09-11 DIAGNOSIS — F902 Attention-deficit hyperactivity disorder, combined type: Secondary | ICD-10-CM

## 2021-09-11 DIAGNOSIS — F439 Reaction to severe stress, unspecified: Secondary | ICD-10-CM

## 2021-09-11 MED ORDER — METHYLPHENIDATE HCL ER (OSM) 54 MG PO TBCR: 54.0000 mg | EXTENDED_RELEASE_TABLET | Freq: Every day | ORAL | 0 refills | Status: DC

## 2021-09-11 MED ORDER — BUPROPION HCL ER (XL) 150 MG PO TB24: 150.0000 mg | ORAL_TABLET | Freq: Every day | ORAL | 1 refills | Status: DC

## 2021-09-11 NOTE — Assessment & Plan Note (Signed)
She is back on Concerta which is great this really seems to be the best fit overall we will continue with current regimen for now.  90-day supply sent to the pharmacy.  Call if any problems or concerns.

## 2021-09-11 NOTE — Assessment & Plan Note (Addendum)
Great News!  blood pressures seem to be improving we will continue to keep an eye on it if she continues to do well we might even be able to discontinue or decrease the medication which would be wonderful.

## 2021-09-11 NOTE — Assessment & Plan Note (Signed)
She has had some significant benefit from the Wellbutrin but I am concerned that it is making her feel more sad.  We discussed options including just backing down on the Wellbutrin back to 150 mg.  I think now that she has been able to get back on the Concerta for her ADD that will help really balance some things and she really may not need a higher dose of the Wellbutrin.  We also discussed the possibility of also switching to a different SSRI which she is not keen on but it is an option.

## 2021-09-11 NOTE — Progress Notes (Signed)
Virtual Visit via Video Note  I connected with [name] on 09/11/21 at 10:50 AM EDT by a video enabled telemedicine application and verified that I am speaking with the correct person using two identifiers.   I discussed the limitations of evaluation and management by telemedicine and the availability of in person appointments. The patient expressed understanding and agreed to proceed.  Patient location: at home Provider location: in office  Subjective:    CC:   Chief Complaint  Patient presents with   ADHD    The Wellbutrin is causing her to feel depressed.     HPI: ADHD - Reports symptoms are well controlled on current regime. Denies any problems with insomnia, chest pain, palpitations, or SOB.  Currently taking Concerta in the a.m. and Wellbutrin in the AM.  She was able to finally get the Concerta filled she says it is like night and day.  She says it is much more effective and she feels much better on it compared to the Focalin.  She also says her blood pressures have been much better since switching to the Concerta the Focalin was probably causing some bump up in pressures.  In fact she was seen at another office and her blood pressure was 107/78 which is fantastic.  He also wanted to discuss the Wellbutrin.  We went up on her dose last time in part to help with the ADD.  It also seem to be helping mood.  But since going up on her dose she is actually just felt more down and sad and.  It seems unexplained except for the medication change.  No other external triggers.  She did note that she has not been biting her nails which is a habit that she has had for years.  Has been trying to work on getting more healthy and getting to the gym regularly.    Past medical history, Surgical history, Family history not pertinant except as noted below, Social history, Allergies, and medications have been entered into the medical record, reviewed, and corrections made.    Objective:     General: Speaking clearly in complete sentences without any shortness of breath.  Alert and oriented x3.  Normal judgment. No apparent acute distress.    Impression and Recommendations:    Problem List Items Addressed This Visit       Cardiovascular and Mediastinum   Essential hypertension    Great News!  blood pressures seem to be improving we will continue to keep an eye on it if she continues to do well we might even be able to discontinue or decrease the medication which would be wonderful.        Other   Stress at home    She has had some significant benefit from the Wellbutrin but I am concerned that it is making her feel more sad.  We discussed options including just backing down on the Wellbutrin back to 150 mg.  I think now that she has been able to get back on the Concerta for her ADD that will help really balance some things and she really may not need a higher dose of the Wellbutrin.  We also discussed the possibility of also switching to a different SSRI which she is not keen on but it is an option.      Attention deficit hyperactivity disorder (ADHD), combined type - Primary    She is back on Concerta which is great this really seems to be the  best fit overall we will continue with current regimen for now.  90-day supply sent to the pharmacy.  Call if any problems or concerns.      Relevant Medications   methylphenidate (CONCERTA) 54 MG PO CR tablet   methylphenidate (CONCERTA) 54 MG PO CR tablet (Start on 10/11/2021)   methylphenidate (CONCERTA) 54 MG PO CR tablet (Start on 11/09/2021)    No orders of the defined types were placed in this encounter.   Meds ordered this encounter  Medications   methylphenidate (CONCERTA) 54 MG PO CR tablet    Sig: Take 1 tablet (54 mg total) by mouth daily.    Dispense:  30 tablet    Refill:  0   buPROPion (WELLBUTRIN XL) 150 MG 24 hr tablet    Sig: Take 1 tablet (150 mg total) by mouth daily.    Dispense:  90 tablet     Refill:  1   methylphenidate (CONCERTA) 54 MG PO CR tablet    Sig: Take 1 tablet (54 mg total) by mouth daily.    Dispense:  30 tablet    Refill:  0   methylphenidate (CONCERTA) 54 MG PO CR tablet    Sig: Take 1 tablet (54 mg total) by mouth daily.    Dispense:  30 tablet    Refill:  0   I spent 25 minutes on the day of the encounter to include pre-visit record review, face-to-face time with the patient and post visit ordering of test.   I discussed the assessment and treatment plan with the patient. The patient was provided an opportunity to ask questions and all were answered. The patient agreed with the plan and demonstrated an understanding of the instructions.   The patient was advised to call back or seek an in-person evaluation if the symptoms worsen or if the condition fails to improve as anticipated.   Nani Gasser, MD

## 2021-10-04 ENCOUNTER — Encounter: Payer: Self-pay | Admitting: Family Medicine

## 2021-10-04 ENCOUNTER — Ambulatory Visit (AMBULATORY_SURGERY_CENTER): Payer: Self-pay

## 2021-10-04 VITALS — Ht 66.0 in | Wt 287.0 lb

## 2021-10-04 DIAGNOSIS — Z800 Family history of malignant neoplasm of digestive organs: Secondary | ICD-10-CM

## 2021-10-04 MED ORDER — NA SULFATE-K SULFATE-MG SULF 17.5-3.13-1.6 GM/177ML PO SOLN: 1.0000 | ORAL | 0 refills | Status: DC

## 2021-10-04 NOTE — Progress Notes (Signed)
No egg or soy allergy known to patient  No issues known to pt with past sedation with any surgeries or procedures Patient denies ever being told they had issues or difficulty with intubation  No FH of Malignant Hyperthermia Pt is not on diet pills Pt is not on  home 02  Pt is not on blood thinners  Pt denies issues with constipation  No A fib or A flutter Have any cardiac testing pending--denied Pt instructed to use [email or web address] or GoodRx for a price reduction on prep   

## 2021-10-06 ENCOUNTER — Encounter: Payer: Self-pay | Admitting: Family Medicine

## 2021-10-08 MED ORDER — VALACYCLOVIR HCL 1 G PO TABS: 1000.0000 mg | ORAL_TABLET | Freq: Two times a day (BID) | ORAL | 1 refills | Status: DC

## 2021-10-22 ENCOUNTER — Ambulatory Visit (AMBULATORY_SURGERY_CENTER): Payer: No Typology Code available for payment source | Admitting: Internal Medicine

## 2021-10-22 ENCOUNTER — Encounter: Payer: Self-pay | Admitting: Internal Medicine

## 2021-10-22 VITALS — BP 110/64 | HR 83 | Temp 97.8°F | Resp 11 | Ht 66.0 in | Wt 287.0 lb

## 2021-10-22 DIAGNOSIS — Z1211 Encounter for screening for malignant neoplasm of colon: Secondary | ICD-10-CM | POA: Diagnosis not present

## 2021-10-22 DIAGNOSIS — Z800 Family history of malignant neoplasm of digestive organs: Secondary | ICD-10-CM | POA: Diagnosis not present

## 2021-10-22 MED ORDER — SODIUM CHLORIDE 0.9 % IV SOLN: 500.0000 mL | Freq: Once | INTRAVENOUS | Status: DC

## 2021-10-22 NOTE — Progress Notes (Signed)
HISTORY OF PRESENT ILLNESS:  [name] is a 46 y.o. female with a family history of colon cancer in her mother at age 57.  Presents today for repeat screening.  Index examination 2018 was negative for neoplasia  REVIEW OF SYSTEMS:  All non-GI ROS negative. Past Medical History:  Diagnosis Date   ACL (anterior cruciate ligament) tear    MCL. LCL Left knee   ADHD (attention deficit hyperactivity disorder)    husband   Anemia    B12 deficiency    Blood clot in vein 2016   blood clooting issue after giving birth, small vessels   Chromosomal abnormality    Clotting disorder ([place])    Congenital abnormality    Constipation    Headache    HSV (herpes simplex virus) anogenital infection    Joint pain    Kidney stone on left side    Lactose intolerance    Miscarriage    x 8   MTHFR (methylene THF reductase) deficiency and homocystinuria ([place])    Pinched nerve    neck/shoulder   Plantar fasciitis    Post partum depression     Past Surgical History:  Procedure Laterality Date   arm surgery for fracture     COLONOSCOPY  2018   DILATION AND CURETTAGE OF UTERUS  1998   KNEE SURGERY Left    torn menicus   TONSILLECTOMY AND ADENOIDECTOMY     WISDOM TOOTH EXTRACTION      Social History SIGNE TACKITT  reports that she quit smoking about 17 years ago. Her smoking use included cigarettes. She has never used smokeless tobacco. She reports current alcohol use of about 1.0 standard drink of alcohol per week. She reports that she does not use drugs.  family history includes Autism spectrum disorder in her daughter; Breast cancer (age of onset: 11) in her maternal aunt; Chiari malformation in her sister; Colon cancer in her maternal aunt and maternal uncle; Colon cancer (age of onset: 66) in her mother; Colon cancer (age of onset: 1) in her maternal grandmother; Colon cancer (age of onset: 52) in an other family member; Colon polyps in her mother; Diabetes in her paternal grandmother;  Heart attack in her maternal grandfather; Hypertension in her maternal grandfather; Lung cancer (age of onset: 37) in her father; Other in her daughter and maternal aunt; Ovarian cancer in her maternal aunt. She was adopted.  Allergies  Allergen Reactions   Dairy Aid [Tilactase] Other (See Comments)    Excess gas, bloating, diarrhea   Nickel    Phentermine Other (See Comments)    Heart murmur started   Topamax [Topiramate] Other (See Comments)    Memory loss       PHYSICAL EXAMINATION: Vital signs: BP 117/65   Pulse 96   Temp 97.8 F (36.6 C) (Temporal)   Ht 5\' 6"  (1.676 m)   Wt 287 lb (130.2 kg)   SpO2 100%   BMI 46.32 kg/m  General: Well-developed, well-nourished, no acute distress HEENT: Sclerae are anicteric, conjunctiva pink. Oral mucosa intact Lungs: Clear Heart: Regular Abdomen: soft, nontender, nondistended, no obvious ascites, no peritoneal signs, normal bowel sounds. No organomegaly. Extremities: No edema Psychiatric: alert and oriented x3. Cooperative      ASSESSMENT:  Family history of colon cancer in first-degree relative less than age 74   PLAN:  Screening colonoscopy

## 2021-10-22 NOTE — Progress Notes (Signed)
VS completed by DT.  Pt's states no medical or surgical changes since previsit or office visit.  

## 2021-10-22 NOTE — Op Note (Signed)
[place] Patient Name: [name] Procedure Date: 10/22/2021 10:51 AM MRN: [id number] Endoscopist: Docia Chuck. Henrene Pastor , MD Age: 46 Referring MD:  Date of Birth: 04-Mar-1975 Gender: Female Account #: [id number] Procedure:                Colonoscopy Indications:              Screening in patient at increased risk: Colorectal                            cancer in mother before age 6 (age 61). Index                            examination 2018 was negative for neoplasia Medicines:                Monitored Anesthesia Care Procedure:                Pre-Anesthesia Assessment:                           - Prior to the procedure, a History and Physical                            was performed, and patient medications and                            allergies were reviewed. The patient's tolerance of                            previous anesthesia was also reviewed. The risks                            and benefits of the procedure and the sedation                            options and risks were discussed with the patient.                            All questions were answered, and informed consent                            was obtained. Prior Anticoagulants: The patient has                            taken no previous anticoagulant or antiplatelet                            agents. ASA Grade Assessment: II - A patient with                            mild systemic disease. After reviewing the risks                            and benefits, the patient was deemed in  satisfactory condition to undergo the procedure.                           After obtaining informed consent, the colonoscope                            was passed under direct vision. Throughout the                            procedure, the patient's blood pressure, pulse, and                            oxygen saturations were monitored continuously. The                            Olympus CF-HQ190L  (Serial# [id number]) Colonoscope was                            introduced through the anus and advanced to the the                            cecum, identified by appendiceal orifice and                            ileocecal valve. The ileocecal valve, appendiceal                            orifice, and rectum were photographed. The quality                            of the bowel preparation was good. The colonoscopy                            was performed without difficulty. The patient                            tolerated the procedure well. The bowel preparation                            used was SUPREP via split dose instruction. Scope In: 11:08:24 AM Scope Out: 11:21:49 AM Scope Withdrawal Time: 0 hours 10 minutes 44 seconds  Total Procedure Duration: 0 hours 13 minutes 25 seconds  Findings:                 Multiple diverticula were found in the sigmoid                            colon.                           Internal hemorrhoids were found during retroflexion.                           The exam was otherwise without abnormality on  direct and retroflexion views. Complications:            No immediate complications. Estimated blood loss:                            None. Estimated Blood Loss:     Estimated blood loss: none. Impression:               - Diverticulosis in the sigmoid colon.                           - Internal hemorrhoids.                           - The examination was otherwise normal on direct                            and retroflexion views.                           - No specimens collected. Recommendation:           - Repeat colonoscopy in 5 years for surveillance                            (family history).                           - Patient has a contact number available for                            emergencies. The signs and symptoms of potential                            delayed complications were discussed with the                             patient. Return to normal activities tomorrow.                            Written discharge instructions were provided to the                            patient.                           - Resume previous diet.                           - Continue present medications. Wilhemina Bonito. Marina Goodell, MD 10/22/2021 11:26:56 AM This report has been signed electronically.

## 2021-10-22 NOTE — Patient Instructions (Signed)
Information on diverticulosis given to you today.  Repeat colonoscopy in 5 years for surveillance due to family history.  Resume previous diet and medications.    YOU HAD AN ENDOSCOPIC PROCEDURE TODAY AT [facility]:   Refer to the procedure report that was given to you for any specific questions about what was found during the examination.  If the procedure report does not answer your questions, please call your gastroenterologist to clarify.  If you requested that your care partner not be given the details of your procedure findings, then the procedure report has been included in a sealed envelope for you to review at your convenience later.  YOU SHOULD EXPECT: Some feelings of bloating in the abdomen. Passage of more gas than usual.  Walking can help get rid of the air that was put into your GI tract during the procedure and reduce the bloating. If you had a lower endoscopy (such as a colonoscopy or flexible sigmoidoscopy) you may notice spotting of blood in your stool or on the toilet paper. If you underwent a bowel prep for your procedure, you may not have a normal bowel movement for a few days.  Please Note:  You might notice some irritation and congestion in your nose or some drainage.  This is from the oxygen used during your procedure.  There is no need for concern and it should clear up in a day or so.  SYMPTOMS TO REPORT IMMEDIATELY:  Following lower endoscopy (colonoscopy or flexible sigmoidoscopy):  Excessive amounts of blood in the stool  Significant tenderness or worsening of abdominal pains  Swelling of the abdomen that is new, acute  Fever of 100F or higher For urgent or emergent issues, a gastroenterologist can be reached at any hour by calling [phone]. Do not use MyChart messaging for urgent concerns.    DIET:  We do recommend a small meal at first, but then you may proceed to your regular diet.  Drink plenty of fluids but you should avoid  alcoholic beverages for 24 hours.  ACTIVITY:  You should plan to take it easy for the rest of today and you should NOT DRIVE or use heavy machinery until tomorrow (because of the sedation medicines used during the test).    FOLLOW UP: Our staff will call the number listed on your records the next business day following your procedure.  We will call around 7:15- 8:00 am to check on you and address any questions or concerns that you may have regarding the information given to you following your procedure. If we do not reach you, we will leave a message.     If any biopsies were taken you will be contacted by phone or by letter within the next 1-3 weeks.  Please call us at [phone] if you have not heard about the biopsies in 3 weeks.    SIGNATURES/CONFIDENTIALITY: You and/or your care partner have signed paperwork which will be entered into your electronic medical record.  These signatures attest to the fact that that the information above on your After Visit Summary has been reviewed and is understood.  Full responsibility of the confidentiality of this discharge information lies with you and/or your care-partner.

## 2021-10-22 NOTE — Progress Notes (Signed)
Report to PACU, RN, vss, BBS= Clear.  

## 2021-10-23 ENCOUNTER — Telehealth: Payer: Self-pay | Admitting: *Deleted

## 2021-10-23 NOTE — Telephone Encounter (Signed)
Left message on f/u call 

## 2021-11-02 ENCOUNTER — Other Ambulatory Visit: Payer: Self-pay | Admitting: Family Medicine

## 2021-11-12 ENCOUNTER — Encounter: Payer: Self-pay | Admitting: Family Medicine

## 2021-11-13 NOTE — Telephone Encounter (Signed)
I don't write for the compounded version bc of safety concerns.

## 2021-12-17 ENCOUNTER — Telehealth (INDEPENDENT_AMBULATORY_CARE_PROVIDER_SITE_OTHER): Payer: No Typology Code available for payment source | Admitting: Physician Assistant

## 2021-12-17 ENCOUNTER — Encounter: Payer: Self-pay | Admitting: Physician Assistant

## 2021-12-17 VITALS — HR 98 | Temp 100.0°F | Resp 14 | Wt 285.0 lb

## 2021-12-17 DIAGNOSIS — U071 COVID-19: Secondary | ICD-10-CM

## 2021-12-17 MED ORDER — NIRMATRELVIR/RITONAVIR (PAXLOVID)TABLET: 3.0000 | ORAL_TABLET | Freq: Two times a day (BID) | ORAL | 0 refills | Status: AC

## 2021-12-17 MED ORDER — ONDANSETRON 8 MG PO TBDP: 8.0000 mg | ORAL_TABLET | Freq: Three times a day (TID) | ORAL | 0 refills | Status: DC | PRN

## 2021-12-17 MED ORDER — IPRATROPIUM BROMIDE 0.06 % NA SOLN: 2.0000 | Freq: Four times a day (QID) | NASAL | 0 refills | Status: DC

## 2021-12-17 NOTE — Progress Notes (Signed)
..Virtual Visit via Video Note  I connected with Duwaine Maxin on 12/17/21 at 10:50 AM EST by a video enabled telemedicine application and verified that I am speaking with the correct person using two identifiers.  Location: Patient: home Provider: clinic  .Marland KitchenParticipating in visit:  Patient: As[place] Provider: Tandy Gaw PA-C    I discussed the limitations of evaluation and management by telemedicine and the availability of in person appointments. The patient expressed understanding and agreed to proceed.  History of Present Illness: Pt is a 46 yo female who calls into the clinic on Day 2 of covid symptoms. She has had covid 4 times since 2021. She is fully vaccinated with boosters. She tested positive with home test. Her chest is burning, she is fatigued, runny nose, ST, sinus pressure and congestion. No wheezing or SOB. She has a terrible headache. Tylenol is helping some. She does have low grade fever. She is losing her voice. Her whole family is sick but her husband tested positive for covid as well. She is nauseated as well. She has not ever tried paxlovid.    .. 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/04/2018   Class 3 severe obesity with serious comorbidity and body mass index (BMI) of 40.0 to 44.9 in adult (HCC) 02/04/2018   Heart murmur 03/05/2018   Stress at home 08/13/2018   Adjustment insomnia 08/13/2018   BMI 40.0-44.9, adult (HCC) 08/13/2018   Abnormal weight gain 08/14/2018   Menstrual migraine without status migrainosus, not intractable  03/08/2019   Internal hordeolum of right eye 04/02/2019   Pes anserine bursitis 10/21/2019   Family history of breast cancer 04/28/2020   Essential hypertension 11/23/2020   Encounter for weight management 05/29/2021   Resolved Ambulatory Problems    Diagnosis Date Noted   Lupus anticoagulant positive 10/29/2005   VIRAL URI 03/08/2010   Supervision of pregnancy with grand multiparity 10/01/2010   Recurrent pregnancy loss, antepartum 11/19/2010   BURN, FOREARM 10/12/2010   History of precipitous labor and deliveries, antepartum 01/29/2011   LGA (large for gestational age) fetus 02/22/2011   NSVD (normal spontaneous vaginal delivery) 03/15/2011   Supervision of normal intrauterine pregnancy in multigravida 03/10/2012   Previous child with cardiac abnormality, antepartum 03/10/2012   Short interval between pregnancies affecting pregnancy, antepartum 10/01/2013   Encounter for psychological assessment prior to bariatric surgery 07/08/2017   Chest pain 12/01/2017   Infectious diarrhea 09/20/2019   Elevated BP without diagnosis of hypertension 07/20/2020   Past Medical History:  Diagnosis Date   ACL (anterior cruciate ligament) tear    ADHD (attention deficit hyperactivity disorder)    Anemia    B12 deficiency    Blood clot in vein 2016   Clotting disorder (HCC)    Congenital abnormality    Constipation    Headache    HSV (herpes simplex virus) anogenital infection    Joint pain    Kidney stone on left side    Lactose intolerance    Miscarriage    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     Assessment and Plan: Marland KitchenMarland KitchenRomell  was seen today for covid positive.  Diagnoses and all orders for this visit:  COVID-19 virus infection -     nirmatrelvir/ritonavir EUA (PAXLOVID) 20 x 150 MG & 10 x 100MG  TABS; Take 3 tablets by mouth 2 (two) times daily for 5 days. (Take nirmatrelvir 150 mg two tablets twice daily for 5 days and ritonavir 100 mg one tablet twice daily for 5 days) Patient GFR is 100. -     ipratropium (ATROVENT) 0.06 % nasal spray; Place 2 sprays into both nostrils 4 (four) times daily. -     ondansetron (ZOFRAN-ODT) 8 MG disintegrating tablet; Take 1 tablet (8 mg total) by mouth every 8 (eight) hours as needed for nausea.    Day 2 of covid symptoms, tested positive yesterday Start paxlovid, GFR 100 Do not take relpax while on paxlovid due to certain interactions Continue symptomatic care Quarantine for 5 days and then wear a mask for 10 full days Discussed red flag symptoms and when to seek more care If not improving consider dexamethasone  Follow Up Instructions:    I discussed the assessment and treatment plan with the patient. The patient was provided an opportunity to ask questions and all were answered. The patient agreed with the plan and demonstrated an understanding of the instructions.   The patient was advised to call back or seek an in-person evaluation if the symptoms worsen or if the condition fails to improve as anticipated.   , PA-C

## 2021-12-17 NOTE — Progress Notes (Signed)
+   Covid Fever 100.8 Cough  Burning in lungs Losing voice

## 2022-01-03 ENCOUNTER — Encounter: Payer: Self-pay | Admitting: Physician Assistant

## 2022-01-18 ENCOUNTER — Encounter: Payer: Self-pay | Admitting: Family Medicine

## 2022-01-24 DIAGNOSIS — F4312 Post-traumatic stress disorder, chronic: Secondary | ICD-10-CM | POA: Diagnosis not present

## 2022-02-01 DIAGNOSIS — F4312 Post-traumatic stress disorder, chronic: Secondary | ICD-10-CM | POA: Diagnosis not present

## 2022-02-07 DIAGNOSIS — F4312 Post-traumatic stress disorder, chronic: Secondary | ICD-10-CM | POA: Diagnosis not present

## 2022-02-14 ENCOUNTER — Encounter: Payer: No Typology Code available for payment source | Admitting: Family Medicine

## 2022-02-14 DIAGNOSIS — F4312 Post-traumatic stress disorder, chronic: Secondary | ICD-10-CM | POA: Diagnosis not present

## 2022-02-18 DIAGNOSIS — Z7182 Exercise counseling: Secondary | ICD-10-CM | POA: Diagnosis not present

## 2022-02-18 DIAGNOSIS — Z6841 Body Mass Index (BMI) 40.0 and over, adult: Secondary | ICD-10-CM | POA: Diagnosis not present

## 2022-02-18 DIAGNOSIS — E88819 Insulin resistance, unspecified: Secondary | ICD-10-CM | POA: Diagnosis not present

## 2022-02-18 DIAGNOSIS — I10 Essential (primary) hypertension: Secondary | ICD-10-CM | POA: Diagnosis not present

## 2022-02-18 DIAGNOSIS — Z713 Dietary counseling and surveillance: Secondary | ICD-10-CM | POA: Diagnosis not present

## 2022-02-19 DIAGNOSIS — E88819 Insulin resistance, unspecified: Secondary | ICD-10-CM | POA: Diagnosis not present

## 2022-02-19 DIAGNOSIS — I10 Essential (primary) hypertension: Secondary | ICD-10-CM | POA: Diagnosis not present

## 2022-02-19 DIAGNOSIS — Z6841 Body Mass Index (BMI) 40.0 and over, adult: Secondary | ICD-10-CM | POA: Diagnosis not present

## 2022-02-19 DIAGNOSIS — Z713 Dietary counseling and surveillance: Secondary | ICD-10-CM | POA: Diagnosis not present

## 2022-02-19 DIAGNOSIS — T733XXA Exhaustion due to excessive exertion, initial encounter: Secondary | ICD-10-CM | POA: Diagnosis not present

## 2022-02-20 ENCOUNTER — Encounter: Payer: Self-pay | Admitting: Family Medicine

## 2022-02-20 DIAGNOSIS — F4312 Post-traumatic stress disorder, chronic: Secondary | ICD-10-CM | POA: Diagnosis not present

## 2022-02-25 ENCOUNTER — Ambulatory Visit (INDEPENDENT_AMBULATORY_CARE_PROVIDER_SITE_OTHER): Payer: BLUE CROSS/BLUE SHIELD | Admitting: Family Medicine

## 2022-02-25 ENCOUNTER — Encounter: Payer: Self-pay | Admitting: Family Medicine

## 2022-02-25 VITALS — BP 128/66 | HR 75 | Ht 66.0 in | Wt 304.0 lb

## 2022-02-25 DIAGNOSIS — G4734 Idiopathic sleep related nonobstructive alveolar hypoventilation: Secondary | ICD-10-CM

## 2022-02-25 DIAGNOSIS — R0683 Snoring: Secondary | ICD-10-CM | POA: Diagnosis not present

## 2022-02-25 DIAGNOSIS — I10 Essential (primary) hypertension: Secondary | ICD-10-CM

## 2022-02-25 MED ORDER — LISINOPRIL 10 MG PO TABS: 10.0000 mg | ORAL_TABLET | Freq: Every day | ORAL | 1 refills | Status: DC

## 2022-02-25 MED ORDER — AMBULATORY NON FORMULARY MEDICATION: 0 refills | Status: AC

## 2022-02-25 NOTE — Progress Notes (Signed)
Complete physical exam  Patient: [name]   DOB: [birth year]   47 y.o. Female  MRN: [id number]  Subjective:    Chief Complaint  Patient presents with   Annual Exam    [name] is a 47 y.o. female who presents today for a complete physical exam. She reports consuming a general diet.  Started working out last week  She generally feels well.  She does have additional problems to discuss today.   Stopped her ADD meds a few months ago.  At the time she was working and going to school.  Now she is just staying at home.  She had also noticed that it was causing a rebound food craving in the afternoons when the medication would wear off.  Stopped 10 mg lisinopril as well.    Working with [employer] for [employer] .  Started with their program recently.  He has noted on her watch wearable device that her oxygen occasionally is dropping into the 80s at night it started after she had COVID recently and then it seemed to get better but then she got another upper respiratory infection in the last couple of weeks and it started doing it again.  She does snore some.  Most recent fall risk assessment:    02/25/2022    8:57 AM  Fowler in the past year? 0  Number falls in past yr: 0  Injury with Fall? 0  Risk for fall due to : No Fall Risks  Follow up Falls evaluation completed     Most recent depression screenings:    02/25/2022    8:57 AM 09/11/2021    9:03 AM  PHQ 2/9 Scores  PHQ - 2 Score 0 2  PHQ- 9 Score  5        Patient Care Team: Hali Marry, MD as PCP - General   Outpatient Medications Prior to Visit  Medication Sig   cholecalciferol (VITAMIN D3) 25 MCG (1000 UNIT) tablet Take 1,000 Units by mouth daily.   eletriptan (RELPAX) 20 MG tablet TAKE 1-2 TABLETS (20-40 MG TOTAL) BY MOUTH AS NEEDED FOR MIGRAINE OR HEADACHE. MAY REPEAT IN 2 HOURS IF HEADACHE PERSISTS OR RECURS.   Multiple Vitamin (MULTIVITAMIN) tablet Take 1 tablet by mouth  daily.   valACYclovir (VALTREX) 1000 MG tablet Take 1 tablet (1,000 mg total) by mouth 2 (two) times daily.   [DISCONTINUED] buPROPion (WELLBUTRIN XL) 150 MG 24 hr tablet Take 1 tablet (150 mg total) by mouth daily.   [DISCONTINUED] buPROPion (WELLBUTRIN XL) 300 MG 24 hr tablet TAKE 1 TABLET BY MOUTH EVERY DAY   [DISCONTINUED] fluticasone (FLONASE) 50 MCG/ACT nasal spray Place 1 spray into both nostrils daily as needed for allergies or rhinitis.   [DISCONTINUED] ipratropium (ATROVENT) 0.06 % nasal spray Place 2 sprays into both nostrils 4 (four) times daily.   [DISCONTINUED] lisinopril (ZESTRIL) 10 MG tablet Take 1 tablet (10 mg total) by mouth daily.   [DISCONTINUED] methylphenidate (CONCERTA) 54 MG PO CR tablet Take 1 tablet (54 mg total) by mouth daily.   [DISCONTINUED] methylphenidate (CONCERTA) 54 MG PO CR tablet Take 1 tablet (54 mg total) by mouth daily. (Patient not taking: Reported on 10/04/2021)   [DISCONTINUED] methylphenidate (CONCERTA) 54 MG PO CR tablet Take 1 tablet (54 mg total) by mouth daily. (Patient not taking: Reported on 10/04/2021)   [DISCONTINUED] ondansetron (ZOFRAN-ODT) 8 MG disintegrating tablet Take 1 tablet (8 mg total) by mouth every 8 (eight)  hours as needed for nausea.   No facility-administered medications prior to visit.    ROS        Objective:     BP 128/66   Pulse 75   Ht 5\' 6"  (1.676 m)   Wt (!) 304 lb (137.9 kg)   SpO2 100%   BMI 49.07 kg/m    Physical Exam Constitutional:      Appearance: She is well-developed.  HENT:     Head: Normocephalic and atraumatic.     Right Ear: External ear normal.     Left Ear: External ear normal.     Nose: Nose normal.  Eyes:     Conjunctiva/sclera: Conjunctivae normal.     Pupils: Pupils are equal, round, and reactive to light.  Neck:     Thyroid: No thyromegaly.  Cardiovascular:     Rate and Rhythm: Normal rate and regular rhythm.     Heart sounds: Normal heart sounds.  Pulmonary:     Effort:  Pulmonary effort is normal.     Breath sounds: Normal breath sounds. No wheezing.  Musculoskeletal:     Cervical back: Neck supple.  Lymphadenopathy:     Cervical: No cervical adenopathy.  Skin:    General: Skin is warm and dry.  Neurological:     Mental Status: She is alert and oriented to person, place, and time.      No results found for any visits on 02/25/22.     Assessment & Plan:    Routine Health Maintenance and Physical Exam  Immunization History  Administered Date(s) Administered   Influenza Whole 12/18/2004   Influenza,inj,Quad PF,6+ Mos 12/23/2014, 11/05/2017, 11/08/2018, 11/23/2020   Influenza-Unspecified 12/08/2015   PFIZER(Purple Top)SARS-COV-2 Vaccination 03/12/2019, 03/23/2019   Td 01/21/2002   Tdap 10/03/2016    Health Maintenance  Topic Date Due   INFLUENZA VACCINE  04/21/2022 (Originally 08/21/2021)   Hepatitis C Screening  02/26/2023 (Originally 06/06/1995)   COVID-19 Vaccine (3 - 2023-24 season) 03/14/2023 (Originally 09/21/2021)   PAP SMEAR-Modifier  12/29/2024   DTaP/Tdap/Td (3 - Td or Tdap) 10/04/2026   COLONOSCOPY (Pts 45-65yrs Insurance coverage will need to be confirmed)  10/23/2026   HIV Screening  Completed   Pneumococcal Vaccine 62-79 Years old  Aged Out   HPV VACCINES  Aged Out    Discussed health benefits of physical activity, and encouraged her to engage in regular exercise appropriate for her age and condition.  Problem List Items Addressed This Visit       Cardiovascular and Mediastinum   Essential hypertension   Relevant Medications   lisinopril (ZESTRIL) 10 MG tablet   Other Visit Diagnoses     Nocturnal hypoxia    -  Primary   Relevant Medications   AMBULATORY NON FORMULARY MEDICATION   Snores           Paternal hypoxia-we will start with an overnight pulse ox.  If it does show that she is dropping significantly then we will get her tested for sleep apnea since she does snore.  Keep up a regular exercise program and  make sure you are eating a healthy diet Try to eat 4 servings of dairy a day, or if you are lactose intolerant take a calcium with vitamin D daily.  Your vaccines are up to date.   Return in about 4 weeks (around 03/25/2022) for Hypertension, nurse or [facility] can check it. Beatrice Lecher, MD

## 2022-02-28 DIAGNOSIS — F4312 Post-traumatic stress disorder, chronic: Secondary | ICD-10-CM | POA: Diagnosis not present

## 2022-03-07 DIAGNOSIS — F4312 Post-traumatic stress disorder, chronic: Secondary | ICD-10-CM | POA: Diagnosis not present

## 2022-03-12 DIAGNOSIS — R632 Polyphagia: Secondary | ICD-10-CM | POA: Diagnosis not present

## 2022-03-12 DIAGNOSIS — Z7182 Exercise counseling: Secondary | ICD-10-CM | POA: Diagnosis not present

## 2022-03-12 DIAGNOSIS — I10 Essential (primary) hypertension: Secondary | ICD-10-CM | POA: Diagnosis not present

## 2022-03-12 DIAGNOSIS — Z713 Dietary counseling and surveillance: Secondary | ICD-10-CM | POA: Diagnosis not present

## 2022-03-14 DIAGNOSIS — F4312 Post-traumatic stress disorder, chronic: Secondary | ICD-10-CM | POA: Diagnosis not present

## 2022-03-18 ENCOUNTER — Encounter: Payer: Self-pay | Admitting: Family Medicine

## 2022-03-20 NOTE — Telephone Encounter (Signed)
I haven't seen any results yet.  I am happy to get her in. OK to use an acute slot this week.

## 2022-03-21 ENCOUNTER — Ambulatory Visit: Payer: BLUE CROSS/BLUE SHIELD | Admitting: Family Medicine

## 2022-03-21 ENCOUNTER — Encounter: Payer: Self-pay | Admitting: Family Medicine

## 2022-03-21 VITALS — BP 129/72 | HR 93 | Ht 66.0 in | Wt 305.0 lb

## 2022-03-21 DIAGNOSIS — R0602 Shortness of breath: Secondary | ICD-10-CM | POA: Diagnosis not present

## 2022-03-21 DIAGNOSIS — R058 Other specified cough: Secondary | ICD-10-CM | POA: Diagnosis not present

## 2022-03-21 DIAGNOSIS — F4312 Post-traumatic stress disorder, chronic: Secondary | ICD-10-CM | POA: Diagnosis not present

## 2022-03-21 NOTE — Progress Notes (Signed)
   Established Patient Office Visit  Subjective   Patient ID: [name], female    DOB: 19-Oct-1975  Age: 47 y.o. MRN: [id number]  No chief complaint on file.   HPI Here for f/u of recent overnight oxymetry results.  She had noticed on her wearable device that her oxygen had been dropping into the 80s at night it started after she had COVID back in the fall.  So we ordered an overnight oximetry it came back with normal results showing that she did not drop less than 92%.  Was able to maintain her oxygen overnight.  She did have some slight dips throughout the night.  She does snore.  She also picked upper respiratory infection from her husband and had a pretty significant cough.  She feels like that is gradually getting better.     ROS    Objective:     BP 129/72   Pulse 93   Ht 5' 6"$  (1.676 m)   Wt (!) 305 lb (138.3 kg)   SpO2 97%   BMI 49.23 kg/m    Physical Exam Vitals and nursing note reviewed.  Constitutional:      Appearance: She is well-developed.  HENT:     Head: Normocephalic and atraumatic.  Cardiovascular:     Rate and Rhythm: Normal rate and regular rhythm.     Heart sounds: Normal heart sounds.  Pulmonary:     Effort: Pulmonary effort is normal.     Breath sounds: Normal breath sounds.  Skin:    General: Skin is warm and dry.  Neurological:     Mental Status: She is alert and oriented to person, place, and time.  Psychiatric:        Behavior: Behavior normal.      No results found for any visits on 03/21/22.    The 10-year ASCVD risk score (Arnett DK, et al., 2019) is: 1.2%    Assessment & Plan:   Problem List Items Addressed This Visit   None Visit Diagnoses     Post-viral cough syndrome    -  Primary   SOB (shortness of breath)          Postviral cough syndrome-it sounds like overall she is gotten significantly better so no further workup needed at this time.  Recommend maybe trying running a humidifier at night to see if that  also helps moisturize the passages and throat.  Reviewed results of overnight pulse oximetry everything looks fantastic and reassuring.  She is starting on her weight loss journey again with [facility] and so hopefully will be able to continue to exercise and ramp up her endurance.  No follow-ups on file.    Beatrice Lecher, MD

## 2022-03-22 ENCOUNTER — Ambulatory Visit: Payer: BLUE CROSS/BLUE SHIELD | Admitting: Family Medicine

## 2022-03-25 DIAGNOSIS — Z6841 Body Mass Index (BMI) 40.0 and over, adult: Secondary | ICD-10-CM | POA: Diagnosis not present

## 2022-03-25 DIAGNOSIS — Z713 Dietary counseling and surveillance: Secondary | ICD-10-CM | POA: Diagnosis not present

## 2022-03-27 ENCOUNTER — Ambulatory Visit: Payer: BLUE CROSS/BLUE SHIELD

## 2022-03-27 ENCOUNTER — Ambulatory Visit: Admit: 2022-03-27 | Payer: BLUE CROSS/BLUE SHIELD | Source: Home / Self Care

## 2022-03-28 DIAGNOSIS — F4312 Post-traumatic stress disorder, chronic: Secondary | ICD-10-CM | POA: Diagnosis not present

## 2022-03-29 ENCOUNTER — Ambulatory Visit: Payer: BLUE CROSS/BLUE SHIELD | Admitting: Medical-Surgical

## 2022-03-29 ENCOUNTER — Encounter: Payer: Self-pay | Admitting: Medical-Surgical

## 2022-03-29 VITALS — BP 116/77 | HR 93 | Temp 98.4°F | Resp 20 | Ht 66.0 in | Wt 301.0 lb

## 2022-03-29 DIAGNOSIS — H66003 Acute suppurative otitis media without spontaneous rupture of ear drum, bilateral: Secondary | ICD-10-CM

## 2022-03-29 MED ORDER — MECLIZINE HCL 25 MG PO TABS: 25.0000 mg | ORAL_TABLET | Freq: Three times a day (TID) | ORAL | 0 refills | Status: DC | PRN

## 2022-03-29 MED ORDER — PREDNISONE 20 MG PO TABS: ORAL_TABLET | ORAL | 0 refills | Status: AC

## 2022-03-29 MED ORDER — ONDANSETRON HCL 4 MG PO TABS: 4.0000 mg | ORAL_TABLET | Freq: Three times a day (TID) | ORAL | 0 refills | Status: DC | PRN

## 2022-03-29 NOTE — Progress Notes (Signed)
Established Patient Office Visit  Subjective   Patient ID: [name], female   DOB: Nov 20, 1975 Age: 47 y.o. MRN: [id number]   Chief Complaint  Patient presents with   Dizziness   Follow-up   HPI Pleasant 47 year old female accompanied by her husband presenting today for in person evaluation of symptoms that started 1 week ago.  Reports that she was in our office for follow-up with her PCP on Thursday and then Friday she began to feel poorly.  She complains of fever, body aches, sore throat, cough, sinus congestion, nausea, dizziness, and ear pain.  Notes that she did a telehealth visit where they prescribed Tamiflu as there has been flu in their household.  She completed the Tamiflu as prescribed but a couple of days into her therapy, she was getting no better and did a second telehealth visit where they gave her amoxicillin as one of her children also has strep.  She continued to have worsening symptoms despite the addition of the antibiotic and the antiviral.  Notes that her right ear was very painful and when she woke 1 morning, she noted fluid coming out of her ear.  She had her child's pediatrician look at it while they were there for the pediatric appointment.  She was switched to Augmentin which she started 2 days ago.  She thought she was starting to feel some better yesterday however this morning she awoke with significant neck pain and dizziness that is exacerbated by head movement.  She has been using Zofran for nausea, and albuterol inhaler as needed, Afrin, and other over-the-counter medications with no resolution to her symptoms.   Objective:    Vitals:   03/29/22 1138  BP: 116/77  Pulse: 93  Temp: 98.4 F (36.9 C)  Resp: 20  Height: '5\' 6"'$  (1.676 m)  Weight: (!) 301 lb 0.6 oz (136.6 kg)  SpO2: 97%  BMI (Calculated): 48.61    Physical Exam Vitals reviewed.  Constitutional:      General: She is not in acute distress.    Appearance: Normal appearance. She is obese.  She is not ill-appearing.  HENT:     Head: Normocephalic and atraumatic.     Ears:     Comments: Right TM erythematous, bulging, no obvious perforation.  Left TM cloudy, bulging, no erythema or perforation.    Nose: Congestion present.     Mouth/Throat:     Mouth: Mucous membranes are moist.     Pharynx: Posterior oropharyngeal erythema present. No oropharyngeal exudate.  Eyes:     General: No scleral icterus.       Right eye: No discharge.        Left eye: No discharge.     Extraocular Movements: Extraocular movements intact.     Conjunctiva/sclera: Conjunctivae normal.     Pupils: Pupils are equal, round, and reactive to light.  Cardiovascular:     Rate and Rhythm: Normal rate and regular rhythm.     Pulses: Normal pulses.     Heart sounds: Normal heart sounds.  Pulmonary:     Effort: Pulmonary effort is normal. No respiratory distress.     Breath sounds: Normal breath sounds. No wheezing, rhonchi or rales.  Musculoskeletal:     Cervical back: Tenderness present.  Lymphadenopathy:     Cervical: Cervical adenopathy present.  Skin:    General: Skin is warm and dry.  Neurological:     Mental Status: She is alert and oriented to person, place, and time.  Psychiatric:  Mood and Affect: Mood normal. Affect is tearful.        Behavior: Behavior normal.        Thought Content: Thought content normal.        Judgment: Judgment normal.   No results found for this or any previous visit (from the past 24 hour(s)).     The 10-year ASCVD risk score (Arnett DK, et al., 2019) is: 0.9%   Values used to calculate the score:     Age: 47 years     Sex: Female     Is Non-Hispanic African American: No     Diabetic: No     Tobacco smoker: No     Systolic Blood Pressure: 99991111 mmHg     Is BP treated: Yes     HDL Cholesterol: 48 mg/dL     Total Cholesterol: 188 mg/dL   Assessment & Plan:   1. Non-recurrent acute suppurative otitis media of both ears without spontaneous rupture of  tympanic membranes Continue Augmentin as prescribed for the full 10-day course.  Adding a taper of prednisone.  Adding meclizine for dizziness.  Refilling Zofran for nausea.  Advised to continue conservative measures as needed but this will take time to fully resolve. - predniSONE (DELTASONE) 20 MG tablet; Take 3 tablets (60 mg total) by mouth daily with breakfast for 3 days, THEN 2 tablets (40 mg total) daily with breakfast for 2 days, THEN 1 tablet (20 mg total) daily with breakfast for 2 days, THEN 0.5 tablets (10 mg total) daily with breakfast for 2 days.  Dispense: 16 tablet; Refill: 0  Return if symptoms worsen or fail to improve. ___________________________________________ Clearnce Sorrel, DNP, APRN, FNP-BC [facility] [place]

## 2022-04-04 DIAGNOSIS — F4312 Post-traumatic stress disorder, chronic: Secondary | ICD-10-CM | POA: Diagnosis not present

## 2022-04-09 ENCOUNTER — Encounter: Payer: Self-pay | Admitting: Medical-Surgical

## 2022-04-09 ENCOUNTER — Ambulatory Visit
Admission: EM | Admit: 2022-04-09 | Discharge: 2022-04-09 | Disposition: A | Discharge status: Home / Self Care | Payer: BLUE CROSS/BLUE SHIELD | Attending: Family Medicine | Admitting: Family Medicine

## 2022-04-09 ENCOUNTER — Encounter: Payer: Self-pay | Admitting: Emergency Medicine

## 2022-04-09 DIAGNOSIS — J029 Acute pharyngitis, unspecified: Secondary | ICD-10-CM

## 2022-04-09 DIAGNOSIS — H9202 Otalgia, left ear: Secondary | ICD-10-CM | POA: Diagnosis not present

## 2022-04-09 DIAGNOSIS — F902 Attention-deficit hyperactivity disorder, combined type: Secondary | ICD-10-CM | POA: Insufficient documentation

## 2022-04-09 DIAGNOSIS — Z6841 Body Mass Index (BMI) 40.0 and over, adult: Secondary | ICD-10-CM | POA: Insufficient documentation

## 2022-04-09 LAB — POCT RAPID STREP A (OFFICE): Rapid Strep A Screen: NEGATIVE

## 2022-04-09 NOTE — ED Triage Notes (Signed)
Patient states that she swabbed the inside of her left ear and there was blood on the qtip.  Patient noticed after getting out of the shower.  Recently treated for an ear infection, strep throat and influenza.  Completed all of antibiotics and steroids.

## 2022-04-09 NOTE — Discharge Instructions (Addendum)
Patient we will follow-up with throat culture results once received.  Advised patient may use OTC Ibuprofen daily, as needed for left ear pain.  Advised if symptoms worsen please follow-up with PCP or ENT for further evaluation.

## 2022-04-09 NOTE — ED Provider Notes (Signed)
[facility]    CSN: [id number] Arrival date & time: 04/09/22  1139      History   Chief Complaint Chief Complaint  Patient presents with   Ear Drainage    Sore throat - Entered by patient    HPI [name] is a 47 y.o. female.   HPI Pleasant 47 year old female presents with sore throat and ear drainage.  Reports was recently evaluated by PCP for a nonrecurrent acute suppurative otitis media of both ears without spontaneous rupture of tympanic membranes, sore throat and influenza treated with meclizine, Zofran and steroid taper for 10 days.  Patient reports there was blood on the tip of her Q-tip after cleaning her left ear.  PMH significant for morbid obesity, ADHD, and chromosomal abnormality.  Past Medical History:  Diagnosis Date   ACL (anterior cruciate ligament) tear    MCL. 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[place])    Pinched nerve    neck/shoulder   Plantar fasciitis    Post partum depression     Patient Active Problem List   Diagnosis Date Noted   Encounter for weight management 05/29/2021   Essential hypertension 11/23/2020   Family history of breast cancer 04/28/2020   Pes anserine bursitis 10/21/2019   Internal hordeolum of right eye 04/02/2019   Menstrual migraine without status migrainosus, not intractable 03/08/2019   Abnormal weight gain 08/14/2018   Stress at home 08/13/2018   Adjustment insomnia 08/13/2018   BMI 40.0-44.9, adult ([place]) 08/13/2018   Heart murmur 03/05/2018    Insulin resistance 02/04/2018   Class 3 severe obesity with serious comorbidity and body mass index (BMI) of 40.0 to 44.9 in adult [facility]) 02/04/2018   Psychological factors affecting morbid obesity ([place]) 07/08/2017   Arthritis 06/18/2017   Metatarsalgia, right foot 06/18/2017   Family history of colon cancer 12/12/2015   Family history of congenital heart defect 07/17/2012   Ureteral calculus, left 04/12/2011   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 Authorizing Provider  AMBULATORY NON FORMULARY MEDICATION Medication Name: Overnight pulse ox.  Dropping into the 80s on her wearable device since COVID Dx. Nocturnal hypoxia 02/25/22  Yes Hali Marry, MD  cholecalciferol (VITAMIN D3) 25 MCG (1000 UNIT) tablet Take 1,000 Units by mouth daily.   Yes [provider]  eletriptan (RELPAX) 20 MG tablet TAKE 1-2 TABLETS (20-40 MG TOTAL) BY MOUTH AS NEEDED FOR MIGRAINE OR HEADACHE. MAY REPEAT IN 2 HOURS IF HEADACHE PERSISTS OR RECURS. 07/09/21  Yes Hali Marry, MD  lisinopril (ZESTRIL) 10 MG tablet Take 1 tablet (10 mg total) by mouth daily. 02/25/22  Yes Hali Marry, MD  meclizine (ANTIVERT) 25 MG tablet Take 1 tablet (25 mg total) by mouth 3 (three) times daily as needed for dizziness. 03/29/22  Yes Samuel Bouche, NP  Multiple Vitamin (MULTIVITAMIN)  tablet Take 1 tablet by mouth daily.   Yes [provider]  ondansetron (ZOFRAN) 4 MG tablet Take 1 tablet (4 mg total) by mouth every 8 (eight) hours as needed for nausea or vomiting. 03/29/22  Yes Samuel Bouche, NP  Phentermine-Topiramate (QSYMIA) 3.75-23 MG CP24  03/14/22  Yes [provider]  valACYclovir (VALTREX) 1000 MG tablet Take 1 tablet (1,000 mg total) by mouth 2 (two) times daily. 10/08/21  Yes Hali Marry, MD    Family History Family History  Adopted: Yes  Problem Relation Age of Onset   Colon cancer Mother 75       died at age 63   Colon polyps Mother    Lung cancer Father 49       smoker   Chiari malformation Sister    Breast cancer Maternal Aunt 14   Ovarian cancer Maternal Aunt    Colon cancer Maternal Aunt    Other Maternal Aunt        reproductive issues   Colon cancer Maternal Uncle    Colon cancer Maternal Grandmother 62   Heart attack Maternal Grandfather    Hypertension Maternal Grandfather    Diabetes Paternal Grandmother        type 1   Autism spectrum disorder Daughter    Other Daughter        Pancreatic insufficiency.    Colon cancer Other 58       mat great grand mother   Stomach cancer Neg Hx    Rectal cancer Neg Hx    Esophageal cancer Neg Hx     Social History Social History   Tobacco Use   Smoking status: Former    Types: Cigarettes    Quit date: 01/27/2004    Years since quitting: 18.2   Smokeless tobacco: Never  Vaping Use   Vaping Use: Never used  Substance Use Topics   Alcohol use: Yes    Alcohol/week: 1.0 standard drink of alcohol    Types: 1 Glasses of wine per week    Comment: 1-2 drinks a week of wine   Drug use: No     Allergies   Dairy aid [tilactase], Nickel, and Topamax [topiramate]   Review of Systems Review of Systems  HENT:  Positive for ear pain.   All other systems reviewed and are negative.    Physical Exam Triage Vital Signs ED Triage Vitals  Enc Vitals Group     BP       Pulse      Resp      Temp      Temp src  SpO2      Weight      Height      Head Circumference      Peak Flow      Pain Score      Pain Loc      Pain Edu?      Excl. in [place]?    No data found.  Updated Vital Signs BP 117/82 (BP Location: Right Arm)   Pulse 89   Temp 99.3 F (37.4 C) (Oral)   Resp 16   Ht 5\' 6"  (1.676 m)   Wt 300 lb (136.1 kg)   LMP 04/07/2022   SpO2 98%   BMI 48.42 kg/m     Physical Exam Vitals and nursing note reviewed.  Constitutional:      Appearance: Normal appearance. She is obese. She is not ill-appearing.  HENT:     Head: Normocephalic and atraumatic.     Right Ear: Tympanic membrane, ear canal and external ear normal.     Left Ear: Tympanic membrane and external ear normal.     Ears:     Comments: Left proximal EAC: Tiny (<0.5 cm) skin avulsion with new dried blood noted; patient reports using Q-tip at home prior to noticing bleeding from left ear canal    Mouth/Throat:     Mouth: Mucous membranes are moist.     Pharynx: Oropharynx is clear.  Eyes:     Extraocular Movements: Extraocular movements intact.     Conjunctiva/sclera: Conjunctivae normal.     Pupils: Pupils are equal, round, and reactive to light.  Cardiovascular:     Rate and Rhythm: Normal rate and regular rhythm.     Pulses: Normal pulses.     Heart sounds: Normal heart sounds.  Pulmonary:     Effort: Pulmonary effort is normal.     Breath sounds: Normal breath sounds. No wheezing, rhonchi or rales.  Musculoskeletal:        General: Normal range of motion.     Cervical back: Normal range of motion and neck supple.  Skin:    General: Skin is warm and dry.  Neurological:     General: No focal deficit present.     Mental Status: She is alert and oriented to person, place, and time.      UC Treatments / Results  Labs (all labs ordered are listed, but only abnormal results are displayed) Labs Reviewed  CULTURE, GROUP A STREP [facility])  POCT RAPID STREP A (OFFICE)     EKG   Radiology No results found.  Procedures Procedures (including critical care time)  Medications Ordered in UC Medications - No data to display  Initial Impression / Assessment and Plan / UC Course  I have reviewed the triage vital signs and the nursing notes.  Pertinent labs & imaging results that were available during my care of the patient were reviewed by me and considered in my medical decision making (see chart for details).     MDM: 1.  Otalgia, left ear-exam reveals small avulsion proximal to TM patient reports using Q-tip prior to left ear canal bleed.  Advised patient to avoid using Q-tips in either auditory canal. 2.  Sore throat-rapid strep negative throat culture ordered. Patient we will follow-up with throat culture results once received.  Advised patient may use OTC Ibuprofen daily, as needed for left ear pain.  Advised if symptoms worsen please follow-up with PCP or ENT for further evaluation.  Final Clinical Impressions(s) / UC Diagnoses   Final diagnoses:  Otalgia, left ear  Sore throat     Discharge Instructions      Patient we will follow-up with throat culture results once received.  Advised patient may use OTC Ibuprofen daily, as needed for left ear pain.  Advised if symptoms worsen please follow-up with PCP or ENT for further evaluation.     ED Prescriptions   None    PDMP not reviewed this encounter.   Eliezer Lofts, [place] 04/09/22 1232

## 2022-04-10 DIAGNOSIS — Z6841 Body Mass Index (BMI) 40.0 and over, adult: Secondary | ICD-10-CM | POA: Diagnosis not present

## 2022-04-10 DIAGNOSIS — R638 Other symptoms and signs concerning food and fluid intake: Secondary | ICD-10-CM | POA: Diagnosis not present

## 2022-04-10 DIAGNOSIS — I10 Essential (primary) hypertension: Secondary | ICD-10-CM | POA: Diagnosis not present

## 2022-04-10 DIAGNOSIS — R632 Polyphagia: Secondary | ICD-10-CM | POA: Diagnosis not present

## 2022-04-11 DIAGNOSIS — F4312 Post-traumatic stress disorder, chronic: Secondary | ICD-10-CM | POA: Diagnosis not present

## 2022-04-12 LAB — CULTURE, GROUP A STREP (THRC)

## 2022-04-18 DIAGNOSIS — F4312 Post-traumatic stress disorder, chronic: Secondary | ICD-10-CM | POA: Diagnosis not present

## 2022-04-22 DIAGNOSIS — F4312 Post-traumatic stress disorder, chronic: Secondary | ICD-10-CM | POA: Diagnosis not present

## 2022-04-29 DIAGNOSIS — Z6841 Body Mass Index (BMI) 40.0 and over, adult: Secondary | ICD-10-CM | POA: Diagnosis not present

## 2022-04-29 DIAGNOSIS — Z713 Dietary counseling and surveillance: Secondary | ICD-10-CM | POA: Diagnosis not present

## 2022-05-02 DIAGNOSIS — F4312 Post-traumatic stress disorder, chronic: Secondary | ICD-10-CM | POA: Diagnosis not present

## 2022-05-08 DIAGNOSIS — R634 Abnormal weight loss: Secondary | ICD-10-CM | POA: Diagnosis not present

## 2022-05-08 DIAGNOSIS — R632 Polyphagia: Secondary | ICD-10-CM | POA: Diagnosis not present

## 2022-05-08 DIAGNOSIS — I10 Essential (primary) hypertension: Secondary | ICD-10-CM | POA: Diagnosis not present

## 2022-05-08 DIAGNOSIS — R638 Other symptoms and signs concerning food and fluid intake: Secondary | ICD-10-CM | POA: Diagnosis not present

## 2022-05-09 DIAGNOSIS — F4312 Post-traumatic stress disorder, chronic: Secondary | ICD-10-CM | POA: Diagnosis not present

## 2022-05-13 DIAGNOSIS — F4312 Post-traumatic stress disorder, chronic: Secondary | ICD-10-CM | POA: Diagnosis not present

## 2022-05-16 DIAGNOSIS — F4312 Post-traumatic stress disorder, chronic: Secondary | ICD-10-CM | POA: Diagnosis not present

## 2022-05-21 DIAGNOSIS — F4312 Post-traumatic stress disorder, chronic: Secondary | ICD-10-CM | POA: Diagnosis not present

## 2022-05-23 DIAGNOSIS — F4312 Post-traumatic stress disorder, chronic: Secondary | ICD-10-CM | POA: Diagnosis not present

## 2022-05-28 DIAGNOSIS — F4312 Post-traumatic stress disorder, chronic: Secondary | ICD-10-CM | POA: Diagnosis not present

## 2022-05-30 DIAGNOSIS — F4312 Post-traumatic stress disorder, chronic: Secondary | ICD-10-CM | POA: Diagnosis not present

## 2022-06-04 DIAGNOSIS — Z713 Dietary counseling and surveillance: Secondary | ICD-10-CM | POA: Diagnosis not present

## 2022-06-04 DIAGNOSIS — Z6841 Body Mass Index (BMI) 40.0 and over, adult: Secondary | ICD-10-CM | POA: Diagnosis not present

## 2022-06-05 DIAGNOSIS — I10 Essential (primary) hypertension: Secondary | ICD-10-CM | POA: Diagnosis not present

## 2022-06-05 DIAGNOSIS — Z6841 Body Mass Index (BMI) 40.0 and over, adult: Secondary | ICD-10-CM | POA: Diagnosis not present

## 2022-06-05 DIAGNOSIS — R634 Abnormal weight loss: Secondary | ICD-10-CM | POA: Diagnosis not present

## 2022-06-06 DIAGNOSIS — F4312 Post-traumatic stress disorder, chronic: Secondary | ICD-10-CM | POA: Diagnosis not present

## 2022-06-13 DIAGNOSIS — F4312 Post-traumatic stress disorder, chronic: Secondary | ICD-10-CM | POA: Diagnosis not present

## 2022-06-20 DIAGNOSIS — F4312 Post-traumatic stress disorder, chronic: Secondary | ICD-10-CM | POA: Diagnosis not present

## 2022-06-26 DIAGNOSIS — F4312 Post-traumatic stress disorder, chronic: Secondary | ICD-10-CM | POA: Diagnosis not present

## 2022-07-01 DIAGNOSIS — R632 Polyphagia: Secondary | ICD-10-CM | POA: Diagnosis not present

## 2022-07-01 DIAGNOSIS — Z6841 Body Mass Index (BMI) 40.0 and over, adult: Secondary | ICD-10-CM | POA: Diagnosis not present

## 2022-07-01 DIAGNOSIS — I10 Essential (primary) hypertension: Secondary | ICD-10-CM | POA: Diagnosis not present

## 2022-07-01 DIAGNOSIS — R634 Abnormal weight loss: Secondary | ICD-10-CM | POA: Diagnosis not present

## 2022-07-01 DIAGNOSIS — R638 Other symptoms and signs concerning food and fluid intake: Secondary | ICD-10-CM | POA: Diagnosis not present

## 2022-07-04 DIAGNOSIS — F4312 Post-traumatic stress disorder, chronic: Secondary | ICD-10-CM | POA: Diagnosis not present

## 2022-07-10 DIAGNOSIS — Z6841 Body Mass Index (BMI) 40.0 and over, adult: Secondary | ICD-10-CM | POA: Diagnosis not present

## 2022-07-10 DIAGNOSIS — Z713 Dietary counseling and surveillance: Secondary | ICD-10-CM | POA: Diagnosis not present

## 2022-07-11 DIAGNOSIS — F4312 Post-traumatic stress disorder, chronic: Secondary | ICD-10-CM | POA: Diagnosis not present

## 2022-07-18 DIAGNOSIS — F4312 Post-traumatic stress disorder, chronic: Secondary | ICD-10-CM | POA: Diagnosis not present

## 2022-07-24 DIAGNOSIS — F4312 Post-traumatic stress disorder, chronic: Secondary | ICD-10-CM | POA: Diagnosis not present

## 2022-08-05 DIAGNOSIS — Z6841 Body Mass Index (BMI) 40.0 and over, adult: Secondary | ICD-10-CM | POA: Diagnosis not present

## 2022-08-05 DIAGNOSIS — I10 Essential (primary) hypertension: Secondary | ICD-10-CM | POA: Diagnosis not present

## 2022-08-05 DIAGNOSIS — Z79899 Other long term (current) drug therapy: Secondary | ICD-10-CM | POA: Diagnosis not present

## 2022-08-05 DIAGNOSIS — R634 Abnormal weight loss: Secondary | ICD-10-CM | POA: Diagnosis not present

## 2022-08-14 DIAGNOSIS — Z713 Dietary counseling and surveillance: Secondary | ICD-10-CM | POA: Diagnosis not present

## 2022-08-14 DIAGNOSIS — Z6841 Body Mass Index (BMI) 40.0 and over, adult: Secondary | ICD-10-CM | POA: Diagnosis not present

## 2022-08-15 DIAGNOSIS — F4312 Post-traumatic stress disorder, chronic: Secondary | ICD-10-CM | POA: Diagnosis not present

## 2022-08-18 ENCOUNTER — Other Ambulatory Visit: Payer: Self-pay | Admitting: Family Medicine

## 2022-08-22 DIAGNOSIS — F4312 Post-traumatic stress disorder, chronic: Secondary | ICD-10-CM | POA: Diagnosis not present

## 2022-08-29 DIAGNOSIS — F4312 Post-traumatic stress disorder, chronic: Secondary | ICD-10-CM | POA: Diagnosis not present

## 2022-09-05 DIAGNOSIS — Z7182 Exercise counseling: Secondary | ICD-10-CM | POA: Diagnosis not present

## 2022-09-05 DIAGNOSIS — Z713 Dietary counseling and surveillance: Secondary | ICD-10-CM | POA: Diagnosis not present

## 2022-09-05 DIAGNOSIS — F4312 Post-traumatic stress disorder, chronic: Secondary | ICD-10-CM | POA: Diagnosis not present

## 2022-09-05 DIAGNOSIS — R634 Abnormal weight loss: Secondary | ICD-10-CM | POA: Diagnosis not present

## 2022-09-05 DIAGNOSIS — I10 Essential (primary) hypertension: Secondary | ICD-10-CM | POA: Diagnosis not present

## 2022-09-12 DIAGNOSIS — F4312 Post-traumatic stress disorder, chronic: Secondary | ICD-10-CM | POA: Diagnosis not present

## 2022-09-16 DIAGNOSIS — Z713 Dietary counseling and surveillance: Secondary | ICD-10-CM | POA: Diagnosis not present

## 2022-09-16 DIAGNOSIS — Z6841 Body Mass Index (BMI) 40.0 and over, adult: Secondary | ICD-10-CM | POA: Diagnosis not present

## 2022-09-19 DIAGNOSIS — F4312 Post-traumatic stress disorder, chronic: Secondary | ICD-10-CM | POA: Diagnosis not present

## 2022-09-24 DIAGNOSIS — F4312 Post-traumatic stress disorder, chronic: Secondary | ICD-10-CM | POA: Diagnosis not present

## 2022-09-26 DIAGNOSIS — F4312 Post-traumatic stress disorder, chronic: Secondary | ICD-10-CM | POA: Diagnosis not present

## 2022-10-03 DIAGNOSIS — F4312 Post-traumatic stress disorder, chronic: Secondary | ICD-10-CM | POA: Diagnosis not present

## 2022-10-09 DIAGNOSIS — R632 Polyphagia: Secondary | ICD-10-CM | POA: Diagnosis not present

## 2022-10-09 DIAGNOSIS — R638 Other symptoms and signs concerning food and fluid intake: Secondary | ICD-10-CM | POA: Diagnosis not present

## 2022-10-09 DIAGNOSIS — F439 Reaction to severe stress, unspecified: Secondary | ICD-10-CM | POA: Diagnosis not present

## 2022-10-09 DIAGNOSIS — I10 Essential (primary) hypertension: Secondary | ICD-10-CM | POA: Diagnosis not present

## 2022-10-10 DIAGNOSIS — F4312 Post-traumatic stress disorder, chronic: Secondary | ICD-10-CM | POA: Diagnosis not present

## 2022-10-15 DIAGNOSIS — Z6841 Body Mass Index (BMI) 40.0 and over, adult: Secondary | ICD-10-CM | POA: Diagnosis not present

## 2022-10-15 DIAGNOSIS — Z713 Dietary counseling and surveillance: Secondary | ICD-10-CM | POA: Diagnosis not present

## 2022-10-17 DIAGNOSIS — F4312 Post-traumatic stress disorder, chronic: Secondary | ICD-10-CM | POA: Diagnosis not present

## 2022-10-24 DIAGNOSIS — F4312 Post-traumatic stress disorder, chronic: Secondary | ICD-10-CM | POA: Diagnosis not present

## 2022-10-31 DIAGNOSIS — F4312 Post-traumatic stress disorder, chronic: Secondary | ICD-10-CM | POA: Diagnosis not present

## 2022-11-07 DIAGNOSIS — F4312 Post-traumatic stress disorder, chronic: Secondary | ICD-10-CM | POA: Diagnosis not present

## 2022-11-11 DIAGNOSIS — F439 Reaction to severe stress, unspecified: Secondary | ICD-10-CM | POA: Diagnosis not present

## 2022-11-11 DIAGNOSIS — R632 Polyphagia: Secondary | ICD-10-CM | POA: Diagnosis not present

## 2022-11-11 DIAGNOSIS — I10 Essential (primary) hypertension: Secondary | ICD-10-CM | POA: Diagnosis not present

## 2022-11-11 DIAGNOSIS — E66813 Obesity, class 3: Secondary | ICD-10-CM | POA: Diagnosis not present

## 2022-11-11 DIAGNOSIS — Z79899 Other long term (current) drug therapy: Secondary | ICD-10-CM | POA: Diagnosis not present

## 2022-11-14 DIAGNOSIS — F4312 Post-traumatic stress disorder, chronic: Secondary | ICD-10-CM | POA: Diagnosis not present

## 2022-11-19 ENCOUNTER — Other Ambulatory Visit: Payer: Self-pay | Admitting: Family Medicine

## 2022-11-21 DIAGNOSIS — F4312 Post-traumatic stress disorder, chronic: Secondary | ICD-10-CM | POA: Diagnosis not present

## 2022-11-27 DIAGNOSIS — Z6839 Body mass index (BMI) 39.0-39.9, adult: Secondary | ICD-10-CM | POA: Diagnosis not present

## 2022-11-27 DIAGNOSIS — E66812 Obesity, class 2: Secondary | ICD-10-CM | POA: Diagnosis not present

## 2022-11-27 DIAGNOSIS — Z713 Dietary counseling and surveillance: Secondary | ICD-10-CM | POA: Diagnosis not present

## 2022-11-28 DIAGNOSIS — F4312 Post-traumatic stress disorder, chronic: Secondary | ICD-10-CM | POA: Diagnosis not present

## 2022-12-12 ENCOUNTER — Encounter (HOSPITAL_BASED_OUTPATIENT_CLINIC_OR_DEPARTMENT_OTHER): Payer: BLUE CROSS/BLUE SHIELD | Admitting: Radiology

## 2022-12-12 DIAGNOSIS — F4312 Post-traumatic stress disorder, chronic: Secondary | ICD-10-CM | POA: Diagnosis not present

## 2022-12-16 DIAGNOSIS — F4312 Post-traumatic stress disorder, chronic: Secondary | ICD-10-CM | POA: Diagnosis not present

## 2022-12-23 DIAGNOSIS — Z6839 Body mass index (BMI) 39.0-39.9, adult: Secondary | ICD-10-CM | POA: Diagnosis not present

## 2022-12-23 DIAGNOSIS — I10 Essential (primary) hypertension: Secondary | ICD-10-CM | POA: Diagnosis not present

## 2022-12-23 DIAGNOSIS — F439 Reaction to severe stress, unspecified: Secondary | ICD-10-CM | POA: Diagnosis not present

## 2022-12-23 DIAGNOSIS — E66812 Obesity, class 2: Secondary | ICD-10-CM | POA: Diagnosis not present

## 2022-12-25 DIAGNOSIS — Z1231 Encounter for screening mammogram for malignant neoplasm of breast: Secondary | ICD-10-CM | POA: Diagnosis not present

## 2022-12-25 LAB — HM MAMMOGRAPHY

## 2022-12-26 ENCOUNTER — Encounter: Payer: Self-pay | Admitting: Family Medicine

## 2022-12-26 DIAGNOSIS — F4312 Post-traumatic stress disorder, chronic: Secondary | ICD-10-CM | POA: Diagnosis not present

## 2022-12-27 ENCOUNTER — Telehealth: Payer: BLUE CROSS/BLUE SHIELD | Admitting: Nurse Practitioner

## 2022-12-27 DIAGNOSIS — J40 Bronchitis, not specified as acute or chronic: Secondary | ICD-10-CM

## 2022-12-28 MED ORDER — PREDNISONE 10 MG PO TABS: ORAL_TABLET | ORAL | 0 refills | Status: DC

## 2022-12-28 MED ORDER — AZITHROMYCIN 250 MG PO TABS: ORAL_TABLET | ORAL | 0 refills | Status: AC

## 2022-12-28 MED ORDER — ALBUTEROL SULFATE HFA 108 (90 BASE) MCG/ACT IN AERS: 2.0000 | INHALATION_SPRAY | Freq: Four times a day (QID) | RESPIRATORY_TRACT | 0 refills | Status: AC | PRN

## 2022-12-28 NOTE — Progress Notes (Signed)
E-Visit for Cough   We are sorry that you are not feeling well.  Here is how we plan to help!  Based on your presentation I believe you most likely have A cough due to bacteria.  When patients have a fever and a productive cough with a change in color or increased sputum production, we are concerned about bacterial bronchitis.  If left untreated it can progress to pneumonia.  If your symptoms do not improve with your treatment plan it is important that you contact your provider.   I have prescribed Azithromyin 250 mg: two tablets now and then one tablet daily for 4 additonal days    In addition   Prednisone 10 mg daily for 6 days (see taper instructions below)  Directions for 6 day taper: Day 1: 2 tablets before breakfast, 1 after both lunch & dinner and 2 at bedtime Day 2: 1 tab before breakfast, 1 after both lunch & dinner and 2 at bedtime Day 3: 1 tab at each meal & 1 at bedtime Day 4: 1 tab at breakfast, 1 at lunch, 1 at bedtime Day 5: 1 tab at breakfast & 1 tab at bedtime Day 6: 1 tab at breakfast  From your responses in the eVisit questionnaire you describe inflammation in the upper respiratory tract which is causing a significant cough.  This is commonly called Bronchitis and has four common causes:   Allergies Viral Infections Acid Reflux Bacterial Infection Allergies, viruses and acid reflux are treated by controlling symptoms or eliminating the cause. An example might be a cough caused by taking certain blood pressure medications. You stop the cough by changing the medication. Another example might be a cough caused by acid reflux. Controlling the reflux helps control the cough.  USE OF BRONCHODILATOR ("RESCUE") INHALERS: There is a risk from using your bronchodilator too frequently.  The risk is that over-reliance on a medication which only relaxes the muscles surrounding the breathing tubes can reduce the effectiveness of medications prescribed to reduce swelling and congestion  of the tubes themselves.  Although you feel brief relief from the bronchodilator inhaler, your asthma may actually be worsening with the tubes becoming more swollen and filled with mucus.  This can delay other crucial treatments, such as oral steroid medications. If you need to use a bronchodilator inhaler daily, several times per day, you should discuss this with your provider.  There are probably better treatments that could be used to keep your asthma under control.     HOME CARE Only take medications as instructed by your medical team. Complete the entire course of an antibiotic. Drink plenty of fluids and get plenty of rest. Avoid close contacts especially the very young and the elderly Cover your mouth if you cough or cough into your sleeve. Always remember to wash your hands A steam or ultrasonic humidifier can help congestion.   GET HELP RIGHT AWAY IF: You develop worsening fever. You become short of breath You cough up blood. Your symptoms persist after you have completed your treatment plan MAKE SURE YOU  Understand these instructions. Will watch your condition. Will get help right away if you are not doing well or get worse.    Thank you for choosing an e-visit.  Your e-visit answers were reviewed by a board certified advanced clinical practitioner to complete your personal care plan. Depending upon the condition, your plan could have included both over the counter or prescription medications.  Please review your pharmacy choice. Make sure the  pharmacy is open so you can pick up prescription now. If there is a problem, you may contact your provider through [employer] and have the prescription routed to another pharmacy.  Your safety is important to [place]. If you have drug allergies check your prescription carefully.   For the next 24 hours you can use MyChart to ask questions about today's visit, request a non-urgent call back, or ask for a work or school excuse. You will  get an email in the next two days asking about your experience. I hope that your e-visit has been valuable and will speed your recovery.

## 2022-12-28 NOTE — Progress Notes (Signed)
I have spent 5 minutes in review of e-visit questionnaire, review and updating patient chart, medical decision making and response to patient.  ° °Jerrell Mangel W Secilia Apps, NP ° °  °

## 2023-01-01 DIAGNOSIS — Z6839 Body mass index (BMI) 39.0-39.9, adult: Secondary | ICD-10-CM | POA: Diagnosis not present

## 2023-01-01 DIAGNOSIS — Z713 Dietary counseling and surveillance: Secondary | ICD-10-CM | POA: Diagnosis not present

## 2023-01-01 DIAGNOSIS — E66812 Obesity, class 2: Secondary | ICD-10-CM | POA: Diagnosis not present

## 2023-01-02 DIAGNOSIS — F4312 Post-traumatic stress disorder, chronic: Secondary | ICD-10-CM | POA: Diagnosis not present

## 2023-01-09 DIAGNOSIS — F4312 Post-traumatic stress disorder, chronic: Secondary | ICD-10-CM | POA: Diagnosis not present

## 2023-01-12 ENCOUNTER — Other Ambulatory Visit: Payer: Self-pay | Admitting: Family Medicine

## 2023-01-29 ENCOUNTER — Encounter: Payer: Self-pay | Admitting: Family Medicine

## 2023-01-29 DIAGNOSIS — Z0000 Encounter for general adult medical examination without abnormal findings: Secondary | ICD-10-CM

## 2023-01-29 DIAGNOSIS — I10 Essential (primary) hypertension: Secondary | ICD-10-CM

## 2023-01-29 DIAGNOSIS — E781 Pure hyperglyceridemia: Secondary | ICD-10-CM

## 2023-01-29 DIAGNOSIS — E88819 Insulin resistance, unspecified: Secondary | ICD-10-CM

## 2023-02-04 ENCOUNTER — Encounter: Payer: Self-pay | Admitting: Family Medicine

## 2023-02-04 ENCOUNTER — Ambulatory Visit (INDEPENDENT_AMBULATORY_CARE_PROVIDER_SITE_OTHER): Payer: BLUE CROSS/BLUE SHIELD | Admitting: Family Medicine

## 2023-02-04 ENCOUNTER — Telehealth: Payer: Self-pay | Admitting: Family Medicine

## 2023-02-04 VITALS — BP 118/58 | HR 81 | Ht 66.0 in | Wt 244.0 lb

## 2023-02-04 DIAGNOSIS — Z1589 Genetic susceptibility to other disease: Secondary | ICD-10-CM | POA: Diagnosis not present

## 2023-02-04 DIAGNOSIS — Z0000 Encounter for general adult medical examination without abnormal findings: Secondary | ICD-10-CM

## 2023-02-04 LAB — CBC WITH DIFFERENTIAL/PLATELET
Basophils Absolute: 0.1 10*3/uL (ref 0.0–0.2)
Basos: 1 %
EOS (ABSOLUTE): 0.2 10*3/uL (ref 0.0–0.4)
Eos: 3 %
Hematocrit: 40.9 % (ref 34.0–46.6)
Hemoglobin: 12.7 g/dL (ref 11.1–15.9)
Immature Grans (Abs): 0 10*3/uL (ref 0.0–0.1)
Immature Granulocytes: 0 %
Lymphocytes Absolute: 3.2 10*3/uL — ABNORMAL HIGH (ref 0.7–3.1)
Lymphs: 36 %
MCH: 27.3 pg (ref 26.6–33.0)
MCHC: 31.1 g/dL — ABNORMAL LOW (ref 31.5–35.7)
MCV: 88 fL (ref 79–97)
Monocytes Absolute: 0.6 10*3/uL (ref 0.1–0.9)
Monocytes: 7 %
Neutrophils Absolute: 4.7 10*3/uL (ref 1.4–7.0)
Neutrophils: 53 %
Platelets: 521 10*3/uL — ABNORMAL HIGH (ref 150–450)
RBC: 4.65 x10E6/uL (ref 3.77–5.28)
RDW: 13.6 % (ref 11.7–15.4)
WBC: 8.8 10*3/uL (ref 3.4–10.8)

## 2023-02-04 LAB — LIPID PANEL
Chol/HDL Ratio: 4 {ratio} (ref 0.0–4.4)
Cholesterol, Total: 174 mg/dL (ref 100–199)
HDL: 43 mg/dL (ref >39)
LDL Chol Calc (NIH): 99 mg/dL (ref 0–99)
Triglycerides: 185 mg/dL — ABNORMAL HIGH (ref 0–149)
VLDL Cholesterol Cal: 32 mg/dL (ref 5–40)

## 2023-02-04 LAB — CMP14+EGFR
ALT: 10 [IU]/L (ref 0–32)
AST: 9 [IU]/L (ref 0–40)
Albumin: 4.2 g/dL (ref 3.9–4.9)
Alkaline Phosphatase: 70 [IU]/L (ref 44–121)
BUN/Creatinine Ratio: 23 (ref 9–23)
BUN: 18 mg/dL (ref 6–24)
Bilirubin Total: 0.2 mg/dL (ref 0.0–1.2)
CO2: 20 mmol/L (ref 20–29)
Calcium: 9.8 mg/dL (ref 8.7–10.2)
Chloride: 107 mmol/L — ABNORMAL HIGH (ref 96–106)
Creatinine, Ser: 0.78 mg/dL (ref 0.57–1.00)
Globulin, Total: 3 g/dL (ref 1.5–4.5)
Glucose: 84 mg/dL (ref 70–99)
Potassium: 4.9 mmol/L (ref 3.5–5.2)
Sodium: 140 mmol/L (ref 134–144)
Total Protein: 7.2 g/dL (ref 6.0–8.5)
eGFR: 95 mL/min/{1.73_m2} (ref >59)

## 2023-02-04 LAB — HEMOGLOBIN A1C
Est. average glucose Bld gHb Est-mCnc: 103 mg/dL
Hgb A1c MFr Bld: 5.2 % (ref 4.8–5.6)

## 2023-02-04 NOTE — Telephone Encounter (Signed)
 Cannot do the B1 or B6 due to collection. Should be able to add on B12 and homocystine

## 2023-02-04 NOTE — Progress Notes (Signed)
 Hi [name],  Your total cholesterol and LDL look good.  Triglycerides are still up just a little bit continue to work on healthy diet and regular exercise.  Metabolic panel overall looks okay.  No anemia with a hemoglobin of 12.7.  Platelets are still slightly elevated.  They have been running in the 400-500 range for the last 7 years so it is not unusual for you.  A1c is great at 5.2.

## 2023-02-04 NOTE — Progress Notes (Signed)
 Complete physical exam  Patient: [name]   DOB: 21-Jun-1975   48 y.o. Female  MRN: [id number]  Subjective:    Chief Complaint  Patient presents with   Annual Exam    [name] is a 48 y.o. female who presents today for a complete physical exam. She reports consuming a general diet.  She was previously exercising regularly but is been a little bit more limited lately after she developed mycoplasma pneumonia.  She does still notice getting a little short of breath with exercise after she had COVID.  She is feeling some better but still struggling with the residual cough.  She generally feels well. She reports sleeping well. She does have additional problems to discuss today.   Labs completed yesterday.  A1c of 5.2.  Triglycerides slightly elevated.  Persistent platelet elevation.  Also wanted to mention that she has been struggling with feeling dizzy and almost sick feeling and some tingling since she started metformin and went up on her Topamax.  She started the Topamax back in March.  But recently went up on her dose to 50 mg twice a day.  She thought maybe it was hypoglycemia and came in fasting for labs but her glucose was normal.  She has been on the metformin in hopes to improve insulin  resistance to help her with her weight loss she is down 40+ pounds and has plateaued recently and wants to continue to lose weight.  Most recent fall risk assessment:    02/25/2022    8:57 AM  Fall Risk   Falls in the past year? 0  Number falls in past yr: 0  Injury with Fall? 0  Risk for fall due to : No Fall Risks  Follow up Falls evaluation completed     Most recent depression screenings:    02/25/2022    8:57 AM 09/11/2021    9:03 AM  PHQ 2/9 Scores  PHQ - 2 Score 0 2  PHQ- 9 Score  5        Patient Care Team: Alvan Dorothyann BIRCH, MD as PCP - General   Outpatient Medications Prior to Visit  Medication Sig   albuterol  (VENTOLIN  HFA) 108 (90 Base) MCG/ACT inhaler  Inhale 2 puffs into the lungs every 6 (six) hours as needed for wheezing or shortness of breath.   AMBULATORY NON FORMULARY MEDICATION Medication Name: Overnight pulse ox.  Dropping into the 80s on her wearable device since COVID Dx. Nocturnal hypoxia   cholecalciferol (VITAMIN D3) 25 MCG (1000 UNIT) tablet Take 1,000 Units by mouth daily.   eletriptan  (RELPAX ) 20 MG tablet TAKE 1-2 TABLETS (20-40 MG TOTAL) BY MOUTH AS NEEDED FOR MIGRAINE OR HEADACHE. MAY REPEAT IN 2 HOURS IF HEADACHE PERSISTS OR RECURS.   Multiple Vitamin (MULTIVITAMIN) tablet Take 1 tablet by mouth daily.   valACYclovir  (VALTREX ) 1000 MG tablet TAKE 1 TABLET BY MOUTH TWICE A DAY   lisinopril  (ZESTRIL ) 10 MG tablet Take 1 tablet (10 mg total) by mouth daily.   [DISCONTINUED] meclizine  (ANTIVERT ) 25 MG tablet Take 1 tablet (25 mg total) by mouth 3 (three) times daily as needed for dizziness.   [DISCONTINUED] ondansetron  (ZOFRAN ) 4 MG tablet Take 1 tablet (4 mg total) by mouth every 8 (eight) hours as needed for nausea or vomiting.   [DISCONTINUED] Phentermine-Topiramate (QSYMIA) 3.75-23 MG CP24    [DISCONTINUED] predniSONE  (DELTASONE ) 10 MG tablet Directions for 6 day taper: Day 1: 2 tablets before breakfast, 1 after both lunch &  dinner and 2 at bedtime Day 2: 1 tab before breakfast, 1 after both lunch & dinner and 2 at bedtime Day 3: 1 tab at each meal & 1 at bedtime Day 4: 1 tab at breakfast, 1 at lunch, 1 at bedtime Day 5: 1 tab at breakfast & 1 tab at bedtime Day 6: 1 tab at breakfast   No facility-administered medications prior to visit.    ROS        Objective:     BP (!) 118/58   Pulse 81   Ht 5' 6 (1.676 m)   Wt 244 lb (110.7 kg)   SpO2 100%   BMI 39.38 kg/m    Physical Exam Vitals and nursing note reviewed.  Constitutional:      Appearance: Normal appearance.  HENT:     Head: Normocephalic and atraumatic.     Right Ear: Tympanic membrane, ear canal and external ear normal.     Left Ear: Tympanic  membrane, ear canal and external ear normal.     Nose: Nose normal.     Mouth/Throat:     Pharynx: Oropharynx is clear.  Eyes:     Extraocular Movements: Extraocular movements intact.     Conjunctiva/sclera: Conjunctivae normal.     Pupils: Pupils are equal, round, and reactive to light.  Neck:     Thyroid : No thyromegaly.  Cardiovascular:     Rate and Rhythm: Normal rate and regular rhythm.  Pulmonary:     Effort: Pulmonary effort is normal.     Breath sounds: Normal breath sounds.  Abdominal:     General: Bowel sounds are normal.     Palpations: Abdomen is soft.     Tenderness: There is no abdominal tenderness.  Musculoskeletal:        General: No swelling.     Cervical back: Neck supple.  Skin:    General: Skin is warm and dry.  Neurological:     Mental Status: She is alert and oriented to person, place, and time.  Psychiatric:        Mood and Affect: Mood normal.        Behavior: Behavior normal.      No results found for any visits on 02/04/23.     Assessment & Plan:    Routine Health Maintenance and Physical Exam  Immunization History  Administered Date(s) Administered   Influenza Whole 12/18/2004   Influenza,inj,Quad PF,6+ Mos 12/23/2014, 11/05/2017, 11/08/2018, 11/23/2020   Influenza-Unspecified 12/08/2015   PFIZER(Purple Top)SARS-COV-2 Vaccination 03/12/2019, 03/23/2019   Td 01/21/2002   Tdap 10/03/2016    Health Maintenance  Topic Date Due   Hepatitis C Screening  02/26/2023 (Originally 06/06/1993)   COVID-19 Vaccine (3 - Pfizer risk series) 03/14/2023 (Originally 04/20/2019)   Cervical Cancer Screening (HPV/Pap Cotest)  12/29/2024   DTaP/Tdap/Td (3 - Td or Tdap) 10/04/2026   Colonoscopy  10/23/2026   INFLUENZA VACCINE  Completed   HIV Screening  Completed   HPV VACCINES  Aged Out    Discussed health benefits of physical activity, and encouraged her to engage in regular exercise appropriate for her age and condition.  Problem List Items Addressed  This Visit       Endocrine   MTHFR gene mutation   Other Visit Diagnoses       Wellness examination    -  Primary       Keep up a regular exercise program and make sure you are eating a healthy diet Try to eat 4 servings of  dairy a day, or if you are lactose intolerant take a calcium with vitamin D  daily.  Your vaccines are up to date.   Like to have some additional labs checked including her B vitamins because of her MTHFR as well as a homocystine level.  Regards to the tingling I do think that could be coming from the Topamax.  The dizziness is a little less clear certainly could be a medication side effect.  No signs of dehydration on exam.  Will go ahead and do some additional labs.  No follow-ups on file.     Dorothyann Byars, MD

## 2023-02-04 NOTE — Telephone Encounter (Signed)
 Call lab to see if we can add on B1, B6, B12 and homocystine level.

## 2023-02-05 NOTE — Telephone Encounter (Signed)
 B12 and Homocystine added

## 2023-02-06 ENCOUNTER — Encounter: Payer: Self-pay | Admitting: Family Medicine

## 2023-02-06 LAB — HOMOCYSTEINE: Homocysteine: 8.1 umol/L (ref 0.0–14.5)

## 2023-02-06 LAB — SPECIMEN STATUS REPORT

## 2023-02-06 LAB — VITAMIN B12: Vitamin B-12: 306 pg/mL (ref 232–1245)

## 2023-02-06 NOTE — Progress Notes (Signed)
Hi Kimm, homocystine level is normal.  B12 is in the normal range but a little bit on the lower end so you might want to try to up your B12 in your diet.

## 2023-03-19 ENCOUNTER — Other Ambulatory Visit: Payer: Self-pay | Admitting: Family Medicine

## 2023-03-27 ENCOUNTER — Encounter: Payer: Self-pay | Admitting: Family Medicine

## 2023-05-16 ENCOUNTER — Other Ambulatory Visit: Payer: Self-pay | Admitting: Family Medicine

## 2023-07-21 ENCOUNTER — Other Ambulatory Visit: Payer: Self-pay

## 2023-07-29 NOTE — Progress Notes (Signed)
 Start Time: 1:58 PM  [facility] follow up appointment number: 14th   Initial [facility] Weight 303.7 lbs   Initial [facility] BMI 49.02  Last Visit's Weight: 251.0 lbs Today's Weight: 252.2 lbs   Today's BMI: 40.7  Wt Readings from Last 3 Encounters:  07/28/23 251 lb (113.9 kg)  05/28/23 246 lb 6.4 oz (111.8 kg)  04/29/23 242 lb 12.8 oz (110.1 kg)    Change in weight since last [facility] Appointment: up 1.2 lbs [facility] Initial weight goal  5% - 15# = 288# - MET Second 10% goal = 274# - MET Third 15% goal - met Fourth 20% goal = 247# - MET, but regained Fifth 25% goal = 235# Longterm wt goal: wants to live longer  24 Hour Recall:    Breakfast: premiere protein and coffee - 2 cups = 160 kcals Snack: sipping broth - 10 kcals x 2 Lunch: Lean Cuisine - 280 kcals (roast chicken and potatoes), steamed veggies Snack: 1/2 pc banana bread Dinner: 2 corn tortilla tacos with all the toppings Snack: 70 kcal brownie Water: 80 oz  Calorie Goal 1300/1500 kcal/d per patient  Last appointments goals:    1.) be sure to include fruit daily - met 2.) increase strength to 3x/week - using PT exercises 2x/day 3.) continue with protein at all meals - met  New Goals: 1.) Continue working within [employer] 2.) Continue exercise 3.) Portion control over getaway  Comorbidities Class 2 obesity, Htn, Stress, Insulin  resistance   Patient update: Pt presents up 1.2# this visit. She last met with RD Wendi in April via telehealth. Today we catch up since our own last visit in December of 2024. She continues to do a great job with thoughtfulness in her nutritional choices and keeping a running talley of her daily kcals. At last NP visit when she transferred to this clinic, the decision was made to d/c her metforming r/t multiple complaints. We agree to give it a month and see what flushes out in regards to her weight and energy levels. Injury to rotator cuff and she is working with PT. Upcoming solo trip in  August prior to school starting up for three days of recharging in nature.  Motivation/self-efficacy: Patient is in the action stage of change.   End Time: 2:24 PM

## 2023-08-18 ENCOUNTER — Other Ambulatory Visit (HOSPITAL_COMMUNITY)
Admission: RE | Admit: 2023-08-18 | Discharge: 2023-08-18 | Disposition: A | Discharge status: Home / Self Care | Source: Ambulatory Visit | Attending: Obstetrics and Gynecology | Admitting: Obstetrics and Gynecology

## 2023-08-18 ENCOUNTER — Encounter: Payer: Self-pay | Admitting: Obstetrics and Gynecology

## 2023-08-18 ENCOUNTER — Ambulatory Visit (INDEPENDENT_AMBULATORY_CARE_PROVIDER_SITE_OTHER): Admitting: Obstetrics and Gynecology

## 2023-08-18 VITALS — BP 123/80 | HR 83 | Ht 66.0 in | Wt 253.0 lb

## 2023-08-18 DIAGNOSIS — Z1151 Encounter for screening for human papillomavirus (HPV): Secondary | ICD-10-CM | POA: Insufficient documentation

## 2023-08-18 DIAGNOSIS — G4700 Insomnia, unspecified: Secondary | ICD-10-CM | POA: Diagnosis not present

## 2023-08-18 DIAGNOSIS — N926 Irregular menstruation, unspecified: Secondary | ICD-10-CM | POA: Insufficient documentation

## 2023-08-18 DIAGNOSIS — Z3202 Encounter for pregnancy test, result negative: Secondary | ICD-10-CM

## 2023-08-18 DIAGNOSIS — Z01419 Encounter for gynecological examination (general) (routine) without abnormal findings: Secondary | ICD-10-CM | POA: Diagnosis present

## 2023-08-18 DIAGNOSIS — B009 Herpesviral infection, unspecified: Secondary | ICD-10-CM

## 2023-08-18 DIAGNOSIS — Z1331 Encounter for screening for depression: Secondary | ICD-10-CM | POA: Diagnosis not present

## 2023-08-18 DIAGNOSIS — Z800 Family history of malignant neoplasm of digestive organs: Secondary | ICD-10-CM

## 2023-08-18 LAB — POCT URINE PREGNANCY: Preg Test, Ur: NEGATIVE

## 2023-08-18 MED ORDER — PROGESTERONE MICRONIZED 100 MG PO CAPS: 100.0000 mg | ORAL_CAPSULE | Freq: Every day | ORAL | 3 refills | Status: AC

## 2023-08-18 NOTE — Progress Notes (Signed)
 ANNUAL EXAM Patient name: [name] MRN [id number]  Date of birth: 05-01-1975 Chief Complaint:   Gynecologic Exam (Pt reports irregularity with cycles, and pain during ovulation. Pt reports has had headaches regularly as well, issues with sleeping. Pt reports cycles last 5-6 days, with 28-40 days in between cycles.)  History of Present Illness:   [name] is a 48 y.o. 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 She is undecided about suppression. Unsure if it is toy related - using them more often due to husband's sexual dysfunction.  Irregular menses: Overall fair regular Q26-29 days but in March she had a 19 day cycle. In May she had a 10 days cycle. Cycles have been normal since May. She skipped a period in October 2024 and then November was quite heavy. Normally they last about 5 days.  Insomnia for last 6 months - no med changes or life changes.  FMH of Ehlers Danlos - she has not been tested [facility] of colon cancer - she has not had genetic testing. She is up to date on her colonoscopy Has CBS deficiency - hyperhomocysteine based disorder.   BC: Vasectomy  Patient's last menstrual period was 07/28/2023.   Last MXR: 12/2022 Gardasil: Has not had.   Last Pap/Pap History:  2021: Pap/HPV wnl   Review of Systems:   Pertinent items are noted in HPI Denies any headaches, blurred vision, fatigue, shortness of breath, chest pain, abdominal pain, abnormal vaginal discharge/itching/odor/irritation, problems with periods, bowel movements, urination, or intercourse unless otherwise stated above.  Pertinent History Reviewed:  Reviewed past medical,surgical, social and family history.  Reviewed problem list, medications and allergies. Physical Assessment:   Vitals:   08/18/23 1323  BP: 123/80  Pulse: 83  Weight: 253 lb (114.8 kg)  Height: 5' 6 (1.676 m)  Body mass index is  40.84 kg/m.   Physical Examination:  General appearance - well appearing, and in no distress Mental status - alert, oriented to person, place, and time Psych:  She has a normal mood and affect Skin - warm and dry, normal color, no suspicious lesions noted Chest - effort normal Heart - normal rate  Breasts - Declines, done by Dr. Alvan. Recent normal MXR.  Abdomen - soft, nontender, nondistended, no masses or organomegaly Pelvic -  Performed and: VULVA: normal appearing vulva with no masses, tenderness or lesions VAGINA: normal appearing vagina with normal color and discharge, no lesions CERVIX: normal appearing cervix without discharge or lesions, no CMT. UTERUS: Not examined ADNEXA: Not examined Extremities:  No swelling or varicosities noted  Chaperone present for exam   GYNECOLOGY OFFICE PROCEDURE NOTE   [name] is a 48 y.o. 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    ENDOMETRIAL BIOPSY     The indications for endometrial biopsy were reviewed.   Risks of the biopsy including cramping, bleeding, infection, uterine perforation, inadequate specimen and need for additional procedures were discussed. Offered alternative of hysteroscopy, dilation and curettage in OR. The patient states she understands the R/B/I/A and agrees to undergo procedure today. Urine pregnancy test was Negative. Consent was signed. Time out was performed.    Patient was positioned in dorsal lithotomy position. A vaginal speculum was placed.  The cervix was visualized and was prepped with Betadine.  A single-toothed tenaculum was placed on the anterior lip of the cervix to stabilize it. The  3 mm pipelle was easily introduced into the endometrial cavity without difficulty to a depth of 9 cm, and a Moderate amount of tissue was obtained after two passes and sent to pathology. The instruments were removed from the patient's vagina. Minimal bleeding from the cervix was noted. The patient tolerated the  procedure well.   Assessment & Plan:  [name] was seen today for gynecologic exam.  Diagnoses and all orders for this visit:  Encounter for annual routine gynecological examination - Cervical cancer screening: Discussed guidelines. Pap with HPV done - Gardasil: Has not had.  - STD Testing: declines - Birth Control: Vasectomy - Breast Health: Encouraged self breast awareness/SBE. Teaching provided. Discussed limits of clinical breast exam for detecting breast cancer. MXR up to date - F/U 12 months and prn -     Cytology - PAP  Irregular menses EMB done today, will also check TVUS. May all be perimenopause related but will confirm with these modalities.  -     Surgical pathology -     US  PELVIC COMPLETE WITH TRANSVAGINAL; Future  HSV (herpes simplex virus) infection Discussed option for suppression if she wishes to try given recent outbreaks. Pt undecided.   Insomnia, unspecified type Discussed possible perimenopause component. Offered trial of prometrium . She would very much like to try this.  -     progesterone  (PROMETRIUM ) 100 MG capsule; Take 1 capsule (100 mg total) by mouth daily.  Negative pregnancy test -     POCT urine pregnancy  Family history of colon cancer -     Ambulatory referral to Genetics     Orders Placed This Encounter  Procedures   US  PELVIC COMPLETE WITH TRANSVAGINAL   Ambulatory referral to Genetics   POCT urine pregnancy    Meds:  Meds ordered this encounter  Medications   progesterone  (PROMETRIUM ) 100 MG capsule    Sig: Take 1 capsule (100 mg total) by mouth daily.    Dispense:  90 capsule    Refill:  3    Follow-up: Return in about 1 year (around 08/17/2024) for annual, pelvic ultrasound.  Vina Solian, MD 08/18/2023 3:40 PM

## 2023-08-20 ENCOUNTER — Ambulatory Visit: Payer: Self-pay | Admitting: Obstetrics and Gynecology

## 2023-08-20 LAB — SURGICAL PATHOLOGY

## 2023-08-21 ENCOUNTER — Other Ambulatory Visit

## 2023-08-22 LAB — CYTOLOGY - PAP
Comment: NEGATIVE
Diagnosis: NEGATIVE
High risk HPV: NEGATIVE

## 2023-08-28 ENCOUNTER — Other Ambulatory Visit

## 2023-09-16 ENCOUNTER — Other Ambulatory Visit: Payer: Self-pay | Admitting: Family Medicine

## 2023-11-21 ENCOUNTER — Other Ambulatory Visit: Payer: Self-pay

## 2023-11-21 ENCOUNTER — Inpatient Hospital Stay: Attending: Hematology

## 2023-11-21 ENCOUNTER — Inpatient Hospital Stay

## 2023-11-21 DIAGNOSIS — Z1379 Encounter for other screening for genetic and chromosomal anomalies: Secondary | ICD-10-CM | POA: Insufficient documentation

## 2023-11-21 DIAGNOSIS — Z800 Family history of malignant neoplasm of digestive organs: Secondary | ICD-10-CM

## 2023-11-21 DIAGNOSIS — Z8049 Family history of malignant neoplasm of other genital organs: Secondary | ICD-10-CM | POA: Insufficient documentation

## 2023-11-21 DIAGNOSIS — Z808 Family history of malignant neoplasm of other organs or systems: Secondary | ICD-10-CM | POA: Diagnosis not present

## 2023-11-21 DIAGNOSIS — Z8051 Family history of malignant neoplasm of kidney: Secondary | ICD-10-CM | POA: Insufficient documentation

## 2023-11-21 DIAGNOSIS — Z8052 Family history of malignant neoplasm of bladder: Secondary | ICD-10-CM | POA: Insufficient documentation

## 2023-11-21 DIAGNOSIS — Z803 Family history of malignant neoplasm of breast: Secondary | ICD-10-CM

## 2023-11-21 DIAGNOSIS — Z806 Family history of leukemia: Secondary | ICD-10-CM | POA: Diagnosis not present

## 2023-11-21 LAB — GENETIC SCREENING ORDER

## 2023-11-21 NOTE — Progress Notes (Addendum)
 REFERRING PROVIDER: Alvan Dorothyann BIRCH, MD [address] [place],  [place] [address]  PRIMARY PROVIDER:  Alvan Dorothyann BIRCH, MD  PRIMARY REASON FOR VISIT:  1. Family history of colon cancer   2. Family history of leukemia   3. Family history of uterine cancer   4. Family history of kidney cancer   5. Family history of cervical cancer   6. Family history of bladder cancer   7. Family history of thyroid  cancer     HISTORY OF PRESENT ILLNESS:   [name], a 48 y.o. female, was seen for a [facility] cancer genetics consultation at the request of Alvan Dorothyann BIRCH, MD due to a family history of cancer.  [name] to clinic today to discuss the possibility of a hereditary predisposition to cancer, genetic testing, and to further clarify her future cancer risks, as well as potential cancer risks for family members.   CANCER HISTORY:  [name] is a 48 y.o. female with no personal history of cancer.    RISK FACTORS:  First live birth at age 51.  Hysterectomy: no.  Menopausal status: premenopausal.  Colonoscopy: yes; normal. Mammogram within the last year: yes. Number of breast biopsies: 0. Tobacco Use: Former (Quit in 2006) Past Medical History:  Diagnosis Date   ACL (anterior cruciate ligament) tear    MCL. LCL Left knee   ADHD (attention deficit hyperactivity disorder)    husband   Anemia    B12 deficiency    Blood clot in vein 2016   blood clooting issue after giving birth, small vessels   Chromosomal abnormality    Clotting disorder    Congenital abnormality    Constipation    Headache    HSV (herpes simplex virus) anogenital infection    Joint pain    Kidney stone on left side    Lactose intolerance    Miscarriage    x 8   MTHFR (methylene THF reductase) deficiency and homocystinuria    Pes anserine bursitis 10/21/2019   Pinched nerve    neck/shoulder   Plantar fasciitis    Post partum depression     Past Surgical  History:  Procedure Laterality Date   arm surgery for fracture     COLONOSCOPY  2018   DILATION AND CURETTAGE OF UTERUS  1998   KNEE SURGERY Left    torn menicus   TONSILLECTOMY AND ADENOIDECTOMY     WISDOM TOOTH EXTRACTION      Social History   Socioeconomic History   Marital status: Married    Spouse name: Rosina   Number of children: 7   Years of education: college   Highest education level: Bachelor's degree (e.g., BA, AB, BS)  Occupational History   Occupation: runner, broadcasting/film/video   Occupation: In school for IT  Tobacco Use   Smoking status: Former    Current packs/day: 0.00    Types: Cigarettes    Quit date: 01/27/2004    Years since quitting: 19.8   Smokeless tobacco: Never  Vaping Use   Vaping status: Never Used  Substance and Sexual Activity   Alcohol  use: Yes    Alcohol /week: 1.0 standard drink of alcohol     Types: 1 Glasses of wine per week    Comment: 1-2 drinks a week of wine   Drug use: No   Sexual activity: Yes    Partners: Male    Birth control/protection: Other-see comments    Comment: vasectomy  Other Topics Concern  Not on file  Social History Narrative   No exercise. 2 cups of coffee a day    Social Drivers of Health   Financial Resource Strain: Medium Risk (05/21/2023)   Received from [facility]   Overall Financial Resource Strain (CARDIA)    Difficulty of Paying Living Expenses: Somewhat hard  Food Insecurity: Food Insecurity Present (05/21/2023)   Received from [facility]   Hunger Vital Sign    Within the past 12 months, you worried that your food would run out before you got the money to buy more.: Sometimes true    Within the past 12 months, the food you bought just didn't last and you didn't have money to get more.: Never true  Transportation Needs: Unmet Transportation Needs (05/21/2023)   Received from [facility] - Transportation    Lack of Transportation (Medical): No    Lack of Transportation (Non-Medical): Yes  Physical  Activity: Sufficiently Active (05/21/2023)   Received from [facility]   Exercise Vital Sign    On average, how many days per week do you engage in moderate to strenuous exercise (like a brisk walk)?: 4 days    On average, how many minutes do you engage in exercise at this level?: 40 min  Stress: Stress Concern Present (05/21/2023)   Received from [facility] - Occupational Stress Questionnaire    Feeling of Stress : To some extent  Social Connections: Somewhat Isolated (05/21/2023)   Received from [facility]   Social Network    How would you rate your social network (family, work, friends)?: Restricted participation with some degree of social isolation     FAMILY HISTORY:  We obtained a detailed, 4-generation family history pasted below.   Weslynn Ke is unaware of relatives completing genetic testing for hereditary cancer risks.  There is no reported Ashkenazi Jewish ancestry.   Significant diagnoses are listed below: Family History  Adopted: Yes  Problem Relation Age of Onset   Colon cancer Mother 67       died at age 25   Colon polyps Mother    Lung cancer Father 68       smoker   Chiari malformation Sister    Breast cancer Maternal Aunt 20   Ovarian cancer Maternal Aunt    Colon cancer Maternal Aunt    Other Maternal Aunt        reproductive issues   Colon cancer Maternal Uncle    Colon cancer Maternal Grandmother 62   Heart attack Maternal Grandfather    Hypertension Maternal Grandfather    Diabetes Paternal Grandmother        type 1   Autism spectrum disorder Daughter    Other Daughter        Pancreatic insufficiency.    Colon cancer Other 89       mat great grand mother   Stomach cancer Neg Hx    Rectal cancer Neg Hx    Esophageal cancer Neg Hx    Pedigree Summary Mother - Colon Cancer dx. 44 Maternal Aunt 1 - Uterine and Cervical Cancer dx. Unknown age Maternal Aunt 2 - Colon Cancer dx. 56 Maternal  Grandmother - Colon and throat cancer Mother of Maternal Grandmother - Colon Cancer dx. 74 Father - Colon Cancer Paterna Uncle 1- Colon and Bladder Cancer dx. Unknown age Paternal Uncle 2 -Kidney and Bladder Cancer dx. Early 84s Paternal 1st Cousin - Leukemia dx. 77 Paternal Half Sister -  Thyroid  Cancer dx. 12 Paternal Half Sister - Uterine, Cervical and Bladder cancer dx 28 (diagnosed at the time of incarceration, currently in incarcerated).     GENETIC COUNSELING ASSESSMENT: Alzena Gerber is a 48 y.o. female with a family history of colon cancer suggestive of a hereditary cancer predisposition syndrome given the young ages at diagnosis and the number of maternal relatives impacted. We, therefore, discussed and recommended the following at today's visit.   DISCUSSION: We discussed that, in general, most cancer is not inherited in families, but instead is sporadic or familial. Sporadic cancers occur by chance and typically happen at older ages (>50 years) as this type of cancer is caused by genetic changes acquired during an individual's lifetime. Some families have more cancers than would be expected by chance; however, the ages or types of cancer are not consistent with a known genetic mutation or known genetic mutations have been ruled out. This type of familial cancer is thought to be due to a combination of multiple genetic, environmental, hormonal, and lifestyle factors. While this combination of factors likely increases the risk of cancer, the exact source of this risk is not currently identifiable or testable.  We discussed that 5-10% of cancer is the result of germline (heritable) genetic variants, with most cases associated with BRCA1/BRCA2. There are other genes that can be associated with hereditary  cancer syndromes. These include MLH1, MSH2, MSH6, and PMS2 each of which are associated with a Lynch Syndrome. We discussed that testing is beneficial for several reasons including knowing  how to follow individuals after completing their treatment, identifying whether potential treatment options such as PARP inhibitors would be beneficial, and understanding if other family members could be at risk for cancer and allow them to undergo genetic testing.   We reviewed the characteristics, features and inheritance patterns of hereditary cancer syndromes. We also discussed genetic testing, including the appropriate family members to test, the process of testing, insurance coverage and turn-around-time for results. We discussed the implications of a negative, positive, carrier and/or variant of uncertain significant result. Biddie Sebek  was offered a common hereditary cancer panel (40 genes) and an expanded pan-cancer panel (77 genes). Chandni Gagan was informed of the benefits and limitations of each panel, including that expanded pan-cancer panels contain genes that do not have clear management guidelines at this point in time.  We also discussed that as the number of genes included on a panel increases, the chances of variants of uncertain significance increases.  GENETIC TESTING NATIONAL CRITERIA: Based on Safaa Rhudy's family history of colon cancer she meets medical criteria for genetic testing based on the [employer] (NCCN) guidelines. Though Demari Kropp is not personally affected, there are no affected family members that are willing/able/available to undergo hereditary cancer testing. Therefore, Jesika Men the most informative family member available. Despite that she meets criteria, she may still have an out of pocket cost. We discussed that if her out of pocket cost for testing is over $100, the laboratory will call and confirm whether she wants to proceed with testing.   GENETIC TESTING CONSENT:  After considering the risks, benefits, and limitations, Stefana Lodico provided informed consent to pursue genetic testing. A blood sample was sent to [facility] for analysis of the CancerNext-Expanded+RNA Panel. Results should be available within approximately 2-3 weeks' time, at which point they will be disclosed by telephone to Powell Lever , as will any additional recommendations warranted by these results. Kloi Brodman will receive  a summary of her genetic counseling visit and a copy of her results once available. This information will also be available in Epic.  [facility] CancerNext-Expanded + RNAinsight gene panel which includes sequencing, rearrangement, and RNA analysis for the following 77 genes: AIP, ALK, APC, ATM, AXIN2, BAP1, BARD1, BMPR1A, BRCA1, BRCA2, BRIP1, CDC73, CDH1, CDK4, CDKN1B, CDKN2A, CEBPA, CHEK2, CTNNA1, DDX41, DICER1, ETV6, FH, FLCN, GATA2, LZTR1, MAX, MBD4, MEN1, MET, MLH1, MSH2, MSH3, MSH6, MUTYH, NF1, NF2, NTHL1, PALB2, PHOX2B, PMS2, POT1, PRKAR1A, PTCH1, PTEN, RAD51C, RAD51D, RB1, RET, RPS20, RUNX1, SDHA, SDHAF2, SDHB, SDHC, SDHD, SMAD4, SMARCA4, SMARCB1, SMARCE1, STK11, SUFU, TMEM127, TP53, TSC1, TSC2, VHL, and WT1 (sequencing and deletion/duplication); EGFR, HOXB13, KIT, MITF, PDGFRA, POLD1, and POLE (sequencing only); EPCAM and GREM1 (deletion/duplication only).    GENETIC INFORMATION NONDISCRIMINATION ACT (GINA): We discussed that some people do not want to undergo genetic testing due to fear of genetic discrimination.  The Genetic Information Nondiscrimination Act (GINA) was signed into federal law in 2008. GINA prohibits health insurers and most employers from discriminating against individuals based on genetic information (including the results of genetic tests and family history information). According to GINA, health insurance companies cannot consider genetic information to be a preexisting condition, nor can they use it to make decisions regarding coverage or rates. GINA also makes it illegal for most employers to use genetic information in making decisions about hiring, firing, promotion, or terms of employment. It is  important to note that GINA does not offer protections for life insurance, disability insurance, or long-term care insurance. GINA does not apply to those in the [employer], those who work for companies with less than 15 employees, and new life insurance or long-term disability insurance policies.  Health status due to a cancer diagnosis is not protected under GINA. More information about GINA can be found by visiting [email or web address].be.  Lastly, we encouraged Lanetta Figuero to remain in contact with cancer genetics annually so that we can continuously update the family history and inform her of any changes in cancer genetics and testing that may be of benefit for this family.   Kyrra Berne's questions were answered to her satisfaction today. Our contact information was provided should additional questions or concerns arise. Thank you for the referral and allowing us  to share in the care of your patient.   Resources:  Akemi Overholser was provided with the following:  [facility] Billing information  [facility] Hereditary Cancer Testing Patient Guide [facility] CancerNextExpaned+RNAInsight Panel Gene List  PLAN:  Testing Ordered:  CancerNext-Expanded + RNAinsight Clinic Note Faxed/Routed to [facility] PCP Alvan Dorothyann BIRCH, MD   Santana Fryer, MS, [facility]  Certified Genetic Counselor  Email: [email or web address]  Phone: [phone]  I personally spent a total of 60 minutes in the care of the patient today including preparing to see the patient, counseling and educating, and documenting clinical information in the EHR.   The patient was seen alone. Drs. Lanny Stalls, and/or Gudena were available for questions, if needed. _______________________________________________________________________ For Office Staff:  Number of people involved in session: 1 Was an Intern/ student involved with case: no

## 2023-12-04 ENCOUNTER — Telehealth: Payer: Self-pay

## 2023-12-04 NOTE — Telephone Encounter (Signed)
 I attempted to contact [name] to review the result of her genetic testing. She was not available and message was left requesting a call back at her convenience.   Santana Fryer, MS, CGC  Certified Genetic Counselor  Email: [email or web address]  Phone: [phone]

## 2023-12-15 ENCOUNTER — Ambulatory Visit: Payer: Self-pay | Admitting: Genetic Counselor

## 2023-12-15 DIAGNOSIS — Z1379 Encounter for other screening for genetic and chromosomal anomalies: Secondary | ICD-10-CM | POA: Insufficient documentation

## 2023-12-15 NOTE — Progress Notes (Signed)
 GENETIC TEST RESULTS  Patient Name: [name] Patient Age: 48 y.o. Encounter Date: 12/15/2023  Referring Provider: Vina Solian, MD   [name] was seen in the Cancer Genetics clinic on 11/21/23 due to a family history of cancer and concern regarding a hereditary predisposition to cancer in the family. Please refer to the prior Genetics clinic note for more information regarding [name]'s medical and family histories and our assessment at the time.   FAMILY HISTORY:  We obtained a detailed, 4-generation family history.  Significant diagnoses are listed below: Family History  Adopted: Yes  Problem Relation Age of Onset   Colon cancer Mother 38       died at age 40   Colon polyps Mother    Cancer Mother    Lung cancer Father 69       smoker   Chiari malformation Sister    Breast cancer Maternal Aunt 20   Ovarian cancer Maternal Aunt    Colon cancer Maternal Aunt    Other Maternal Aunt        reproductive issues   Colon cancer Maternal Uncle    Colon cancer Maternal Grandmother 62   Heart attack Maternal Grandfather    Hypertension Maternal Grandfather    Diabetes Paternal Grandmother        type 1   Autism spectrum disorder Daughter    Other Daughter        Pancreatic insufficiency.    Colon cancer Other 89       mat great grand mother   Stomach cancer Neg Hx    Rectal cancer Neg Hx    Esophageal cancer Neg Hx    There is no reported Ashkenazi Jewish ancestry. [name] reports her maternal half sister had genetic testing with a 18 gene panel that identified a VUS. She reports sending this to [facility], but I was unable to find a copy of the report for comparison.      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  The test report has been scanned into EPIC and is located under the Molecular Pathology section of the Results Review tab.  A  portion of the result report is included below for reference. Genetic testing reported out on 12/03/23.   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  At this time, it is unknown if these variants are associated with increased cancer risk or if they are a normal finding, but most variants such as this get reclassified to being inconsequential. It should not be used to make medical management decisions. With time, we suspect the lab will determine the significance of these variants, if any. If we do learn more about it, we will try to contact [name] to discuss it further. However, it is important to stay in touch with us  periodically and keep the address and phone number up to date.  Cancer Risks for CHEK2 I157T variant: [name] specific mutation  or variant, called p.I157T, has been shown to confer a risk for breast cancer that is less than the breast cancer risk for other mutations in the CHEK2 gene. Thus, her mutation is classified as a low penetrance mutation. Studies in [place] of this low penetrance mutation estimated the lifetime risk for breast cancer among carriers to be increased  by about 50% over the general population risk.  For those of average breast cancer risk, this low penetrance mutation in isolation is not expected to increase one's breast cancer risk >20%.  The Tyrer-Cuzick model is one of multiple prediction models developed to estimate an individual's lifetime risk of developing breast cancer. The Tyrer-Cuzick model is endorsed by the [employer] (NCCN). This model includes many risk factors such as family history, endogenous estrogen exposure, and benign breast disease. The calculation is highly-dependent on the accuracy of clinical data provided by the patient and can change over time. The Tyrer-Cuzick model may be repeated to reflect new  information in her personal or family history in the future.   Based on [name]'s history, a Beatrice Harvey was used to estimate her risk of developing breast cancer. This estimates her lifetime risk of developing breast cancer to be approximately 10.44% and 5 year risk by Alisa model was 0.56%. NCCN recommends consideration of breast MRI screening as an adjunct to mammography for patients at high risk (defined as 20% or greater lifetime risk). Please note that a woman's breast cancer risk changes over time. It may increase or decrease based on age and any changes to the personal and/or family medical history. The risks and recommendations listed above apply to this patient at this point in time. In the future, she may or may not be eligible for the same medical management strategies and, in some cases, other medical management strategies may become available to her. If she is interested in an updated breast cancer risk assessment at a later date, she can contact us .  For women that do not fall into the high risk breast category (<20% lifetime risk, 5 year risk >1.7, atypia, dense tissue), NCCN recommends breast awareness and clinical encounter every 1-3 years beginning at age 35. For individuals over the age of 53, they recommend breast awareness, annual clinical encounter and annual mammogram with tomosynthesis.   Males may be at increased risk of prostate cancer, but the exact risk figure is unknown at this time.   Previous data suggested a risk for colorectal cancer with CHEK2 mutations, but recent studies have not identified significant risk. It is therefore recommended to utilize family history when making decisions about colorectal cancer screening.   There is currently insufficient evidence to support a clear association for other cancers, such as female breast cancer, renal, thyroid , pancreatic, or pediatric cancers. However, other types of cancer have been reported with other variants in the CHEK2  gene. Further information is needed to understand these relationships.    Cancer risks in families with the same CHEK2 mutation can vary widely, suggesting that there are likely to be other factors involved that we don't understand yet that also contribute to the cancer risks associated with CHEK2 mutations. An individual's cancer risk and medical management are not determined by genetic test results alone. Overall cancer risk assessment incorporates additional factors, including personal medical history, family history, and any available genetic information that may result in a personalized plan for cancer prevention and surveillance.   Management Recommendations:  Breast Cancer Screening: Low penetrance variants in CHEK2 are not clinically actionable in isolation for breast cancer risk.  Breast surveillance should be based on personalized assessment including family history as a minimum Ms. Rigsby does not fall into the high risk category based on the information provided. Recommend continuing with annual mammogram.   Colon Cancer Screening: Manage based on personal and family history For individuals  with a first degree relative (parent, sibling, child) diagnosed with colorectal cancer, it is recommended to begin colonoscopy at age 31 or 10 years before the age of diagnosis of the relative with colorectal cancer and repeat every five years or as recommended by a GI provider based on findings.   Ms. Bunker currently completes colonoscopy every 3 years. Continue following recommendations for GI provider, repeating at minimum every 5 years based on family history.   Prostate Cancer Screening: Conduct personalized risk assessment, including family history and other factors, to determine appropriate management  Can consider beginning annual PSA blood test and digital rectal exams at age 39.  This information is based on current understanding of the gene and may change in the future.   Implications  for Family Members: Hereditary predisposition to cancer due to pathogenic variants in the CHEK2 gene has autosomal dominant inheritance. This means that an individual with a pathogenic variant has a 50% chance of passing the variant on to her children.   Family members can consider genetic testing for this familial pathogenic variant.  Caution should be used when testing individuals for low penetrance CHEK2 variants, as this variant is not clinically actionable in isolation.  Survelliance should be personalized and include family history as a minimum. As there are generally no childhood cancer risks associated with pathogenic variants in the CHEK2 gene, individuals in the family are not recommended to have testing until they reach at least 46 years of age. Complimentary testing for the familial variant is available for 90 days after the report date through [employer] They may contact our office at [phone] for more information or to schedule an appointment. Family members who live outside of the area are encouraged to find a genetic counselor in their area by visiting: [email or web address].   Resources: FORCE (Facing Our Risk of Cancer Empowered) is a resource for those with a hereditary predisposition to develop cancer.  FORCE provides information about risk reduction, advocacy, legislation, and clinical trials.  Additionally, FORCE provides a platform for collaboration and support; which includes: peer navigation, message boards, local support groups, a toll-free helpline, research registry and recruitment, advocate training, published medical research, webinars, brochures, mastectomy photos, and more.  For more information, visit www.facingourrisk.org  PLAN:  We encourage Ms. Gunnoe to share this information with her health care providers.   Continue annual mammogram. Continue colonoscopy at minimum every 5 years, or as recommended by GI provider.   Share result with  family members, who could consider completing testing. Family members diagnosed with cancer should have their own testing for other genes as well.    We encouraged Ms. Awan to remain in contact with us  on an annual basis so we can update her personal and family histories, and let her know of advances in cancer genetics that may benefit the family. Our contact number was provided. Ms. Stream questions were answered to her satisfaction today, and she knows she is welcome to call anytime with additional questions.    Burnard Ogren, MS, [facility] Licensed, [email or web address] phone: [phone]

## 2023-12-15 NOTE — Telephone Encounter (Signed)
 I spoke to [name] to review results of genetic testing. Genetic testing completed with [facility]Next-Expanded +RNAinsight panel. 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 We reviewed associated risks, management recommendations, risk to relatives. Please see counseling note for further detail on this result. Portion of result included below.

## 2024-01-01 ENCOUNTER — Telehealth: Payer: Self-pay

## 2024-01-01 DIAGNOSIS — Z0000 Encounter for general adult medical examination without abnormal findings: Secondary | ICD-10-CM

## 2024-01-01 NOTE — Telephone Encounter (Signed)
 Patient scheduled on 02/16/24 for physical, patient would like to come in 1 week prior for fasting labs, ok to put orders in? Please advise, thanks.

## 2024-01-16 NOTE — Telephone Encounter (Signed)
 Please let pt know that her labs have been ordered. TY

## 2024-01-31 LAB — LIPID PANEL WITH LDL/HDL RATIO
Cholesterol, Total: 188 mg/dL (ref 100–199)
HDL: 45 mg/dL
LDL Chol Calc (NIH): 119 mg/dL — ABNORMAL HIGH (ref 0–99)
LDL/HDL Ratio: 2.6 ratio (ref 0.0–3.2)
Triglycerides: 134 mg/dL (ref 0–149)
VLDL Cholesterol Cal: 24 mg/dL (ref 5–40)

## 2024-01-31 LAB — CBC WITH DIFFERENTIAL/PLATELET
Basophils Absolute: 0.1 x10E3/uL (ref 0.0–0.2)
Basos: 1 %
EOS (ABSOLUTE): 0.2 x10E3/uL (ref 0.0–0.4)
Eos: 3 %
Hematocrit: 40.5 % (ref 34.0–46.6)
Hemoglobin: 12.7 g/dL (ref 11.1–15.9)
Immature Grans (Abs): 0 x10E3/uL (ref 0.0–0.1)
Immature Granulocytes: 0 %
Lymphocytes Absolute: 2.8 x10E3/uL (ref 0.7–3.1)
Lymphs: 39 %
MCH: 27.9 pg (ref 26.6–33.0)
MCHC: 31.4 g/dL — ABNORMAL LOW (ref 31.5–35.7)
MCV: 89 fL (ref 79–97)
Monocytes Absolute: 0.5 x10E3/uL (ref 0.1–0.9)
Monocytes: 7 %
Neutrophils Absolute: 3.7 x10E3/uL (ref 1.4–7.0)
Neutrophils: 50 %
Platelets: 461 x10E3/uL — ABNORMAL HIGH (ref 150–450)
RBC: 4.55 x10E6/uL (ref 3.77–5.28)
RDW: 13.4 % (ref 11.7–15.4)
WBC: 7.3 x10E3/uL (ref 3.4–10.8)

## 2024-01-31 LAB — CMP14+EGFR
ALT: 15 IU/L (ref 0–32)
AST: 11 IU/L (ref 0–40)
Albumin: 4.5 g/dL (ref 3.9–4.9)
Alkaline Phosphatase: 54 IU/L (ref 41–116)
BUN/Creatinine Ratio: 17 (ref 9–23)
BUN: 16 mg/dL (ref 6–24)
Bilirubin Total: 0.4 mg/dL (ref 0.0–1.2)
CO2: 20 mmol/L (ref 20–29)
Calcium: 9.5 mg/dL (ref 8.7–10.2)
Chloride: 103 mmol/L (ref 96–106)
Creatinine, Ser: 0.92 mg/dL (ref 0.57–1.00)
Globulin, Total: 2.7 g/dL (ref 1.5–4.5)
Glucose: 86 mg/dL (ref 70–99)
Potassium: 4.5 mmol/L (ref 3.5–5.2)
Sodium: 137 mmol/L (ref 134–144)
Total Protein: 7.2 g/dL (ref 6.0–8.5)
eGFR: 78 mL/min/1.73

## 2024-01-31 LAB — HEMOGLOBIN A1C
Est. average glucose Bld gHb Est-mCnc: 103 mg/dL
Hgb A1c MFr Bld: 5.2 % (ref 4.8–5.6)

## 2024-02-02 ENCOUNTER — Ambulatory Visit: Payer: Self-pay | Admitting: Family Medicine

## 2024-02-02 NOTE — Progress Notes (Signed)
 LDL is mildly elevated, continue to work on [employer] and exercise. Blood count is normal. Your metabolic panel is normal. A1C is normal.

## 2024-02-10 ENCOUNTER — Encounter: Payer: Self-pay | Admitting: Family Medicine

## 2024-02-10 NOTE — Telephone Encounter (Signed)
 Patient scheduled sooner

## 2024-02-12 ENCOUNTER — Encounter: Payer: Self-pay | Admitting: Family Medicine

## 2024-02-12 ENCOUNTER — Ambulatory Visit: Admitting: Family Medicine

## 2024-02-12 VITALS — BP 133/66 | HR 84 | Ht 66.0 in | Wt 267.0 lb

## 2024-02-12 DIAGNOSIS — I10 Essential (primary) hypertension: Secondary | ICD-10-CM

## 2024-02-12 DIAGNOSIS — Z0000 Encounter for general adult medical examination without abnormal findings: Secondary | ICD-10-CM

## 2024-02-12 MED ORDER — LISINOPRIL 10 MG PO TABS: 10.0000 mg | ORAL_TABLET | Freq: Every day | ORAL | 1 refills | Status: AC
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
## 2024-02-16 ENCOUNTER — Encounter: Admitting: Family Medicine
